# Patient Record
Sex: Female | Born: 1935 | Race: White | Hispanic: No | Marital: Married | Smoking: Never smoker
Health system: Southern US, Community
[De-identification: ages and names within clinical notes are randomized; demographics above are authoritative.]

## PROBLEM LIST (undated history)

## (undated) DIAGNOSIS — R945 Abnormal results of liver function studies: Secondary | ICD-10-CM

## (undated) DIAGNOSIS — I2510 Atherosclerotic heart disease of native coronary artery without angina pectoris: Secondary | ICD-10-CM

## (undated) DIAGNOSIS — I10 Essential (primary) hypertension: Secondary | ICD-10-CM

## (undated) DIAGNOSIS — R7989 Other specified abnormal findings of blood chemistry: Secondary | ICD-10-CM

## (undated) DIAGNOSIS — K589 Irritable bowel syndrome without diarrhea: Secondary | ICD-10-CM

## (undated) DIAGNOSIS — I639 Cerebral infarction, unspecified: Secondary | ICD-10-CM

## (undated) DIAGNOSIS — E785 Hyperlipidemia, unspecified: Secondary | ICD-10-CM

## (undated) HISTORY — DX: Essential (primary) hypertension: I10

## (undated) HISTORY — DX: Irritable bowel syndrome, unspecified: K58.9

## (undated) HISTORY — DX: Hyperlipidemia, unspecified: E78.5

## (undated) HISTORY — DX: Abnormal results of liver function studies: R94.5

## (undated) HISTORY — DX: Atherosclerotic heart disease of native coronary artery without angina pectoris: I25.10

## (undated) HISTORY — PX: FEMUR FRACTURE SURGERY: SHX633

## (undated) HISTORY — DX: Other specified abnormal findings of blood chemistry: R79.89

---

## 1997-08-04 ENCOUNTER — Emergency Department (HOSPITAL_COMMUNITY): Admission: EM | Admit: 1997-08-04 | Discharge: 1997-08-04 | Payer: Self-pay | Admitting: Emergency Medicine

## 1998-03-20 ENCOUNTER — Other Ambulatory Visit: Admission: RE | Admit: 1998-03-20 | Discharge: 1998-03-20 | Payer: Self-pay

## 2002-01-07 DIAGNOSIS — I2510 Atherosclerotic heart disease of native coronary artery without angina pectoris: Secondary | ICD-10-CM

## 2002-01-07 HISTORY — DX: Atherosclerotic heart disease of native coronary artery without angina pectoris: I25.10

## 2006-05-22 ENCOUNTER — Encounter: Payer: Self-pay | Admitting: Cardiology

## 2008-07-27 ENCOUNTER — Emergency Department (HOSPITAL_BASED_OUTPATIENT_CLINIC_OR_DEPARTMENT_OTHER): Admission: EM | Admit: 2008-07-27 | Discharge: 2008-07-27 | Payer: Self-pay | Admitting: Emergency Medicine

## 2010-03-06 ENCOUNTER — Ambulatory Visit (INDEPENDENT_AMBULATORY_CARE_PROVIDER_SITE_OTHER): Payer: Medicare Other | Admitting: Nurse Practitioner

## 2010-03-06 DIAGNOSIS — I10 Essential (primary) hypertension: Secondary | ICD-10-CM

## 2010-03-06 DIAGNOSIS — R42 Dizziness and giddiness: Secondary | ICD-10-CM

## 2010-03-20 ENCOUNTER — Ambulatory Visit: Payer: Medicare Other | Admitting: Nurse Practitioner

## 2010-04-15 LAB — COMPREHENSIVE METABOLIC PANEL
ALT: 71 U/L — ABNORMAL HIGH (ref 0–35)
AST: 95 U/L — ABNORMAL HIGH (ref 0–37)
Albumin: 4.1 g/dL (ref 3.5–5.2)
Chloride: 104 mEq/L (ref 96–112)
Creatinine, Ser: 1.1 mg/dL (ref 0.4–1.2)
GFR calc Af Amer: 57 mL/min — ABNORMAL LOW (ref >60)
Potassium: 3.9 mEq/L (ref 3.5–5.1)
Sodium: 141 mEq/L (ref 135–145)
Total Bilirubin: 0.8 mg/dL (ref 0.3–1.2)

## 2010-04-15 LAB — DIFFERENTIAL
Basophils Absolute: 0 10*3/uL (ref 0.0–0.1)
Eosinophils Relative: 0 % (ref 0–5)
Lymphocytes Relative: 28 % (ref 12–46)
Monocytes Absolute: 0.9 10*3/uL (ref 0.1–1.0)

## 2010-04-15 LAB — URINE CULTURE

## 2010-04-15 LAB — URINALYSIS, ROUTINE W REFLEX MICROSCOPIC
Bilirubin Urine: NEGATIVE
Hgb urine dipstick: NEGATIVE
Ketones, ur: NEGATIVE mg/dL
Specific Gravity, Urine: 1.007 (ref 1.005–1.030)
pH: 5.5 (ref 5.0–8.0)

## 2010-04-15 LAB — CBC
MCV: 95.5 fL (ref 78.0–100.0)
Platelets: 181 10*3/uL (ref 150–400)
WBC: 4.6 10*3/uL (ref 4.0–10.5)

## 2010-04-15 LAB — HEMOCCULT GUIAC POC 1CARD (OFFICE): Fecal Occult Bld: NEGATIVE

## 2010-04-15 LAB — URINE MICROSCOPIC-ADD ON

## 2011-04-23 ENCOUNTER — Encounter: Payer: Self-pay | Admitting: *Deleted

## 2014-03-21 ENCOUNTER — Emergency Department (HOSPITAL_COMMUNITY): Payer: Medicare Other

## 2014-03-21 ENCOUNTER — Emergency Department (HOSPITAL_COMMUNITY)
Admission: EM | Admit: 2014-03-21 | Discharge: 2014-03-22 | Disposition: A | Discharge status: Home / Self Care | Payer: Medicare Other | Attending: Emergency Medicine | Admitting: Emergency Medicine

## 2014-03-21 ENCOUNTER — Encounter (HOSPITAL_COMMUNITY): Payer: Self-pay | Admitting: Emergency Medicine

## 2014-03-21 DIAGNOSIS — S6992XA Unspecified injury of left wrist, hand and finger(s), initial encounter: Secondary | ICD-10-CM | POA: Diagnosis present

## 2014-03-21 DIAGNOSIS — Y998 Other external cause status: Secondary | ICD-10-CM | POA: Diagnosis not present

## 2014-03-21 DIAGNOSIS — Z8719 Personal history of other diseases of the digestive system: Secondary | ICD-10-CM | POA: Insufficient documentation

## 2014-03-21 DIAGNOSIS — N390 Urinary tract infection, site not specified: Secondary | ICD-10-CM | POA: Diagnosis not present

## 2014-03-21 DIAGNOSIS — R42 Dizziness and giddiness: Secondary | ICD-10-CM | POA: Insufficient documentation

## 2014-03-21 DIAGNOSIS — W01198A Fall on same level from slipping, tripping and stumbling with subsequent striking against other object, initial encounter: Secondary | ICD-10-CM | POA: Insufficient documentation

## 2014-03-21 DIAGNOSIS — Y9389 Activity, other specified: Secondary | ICD-10-CM | POA: Insufficient documentation

## 2014-03-21 DIAGNOSIS — Z880 Allergy status to penicillin: Secondary | ICD-10-CM | POA: Diagnosis not present

## 2014-03-21 DIAGNOSIS — Z7982 Long term (current) use of aspirin: Secondary | ICD-10-CM | POA: Insufficient documentation

## 2014-03-21 DIAGNOSIS — S52612A Displaced fracture of left ulna styloid process, initial encounter for closed fracture: Secondary | ICD-10-CM | POA: Insufficient documentation

## 2014-03-21 DIAGNOSIS — Y9289 Other specified places as the place of occurrence of the external cause: Secondary | ICD-10-CM | POA: Diagnosis not present

## 2014-03-21 DIAGNOSIS — Z79899 Other long term (current) drug therapy: Secondary | ICD-10-CM | POA: Insufficient documentation

## 2014-03-21 DIAGNOSIS — I10 Essential (primary) hypertension: Secondary | ICD-10-CM | POA: Insufficient documentation

## 2014-03-21 DIAGNOSIS — S62102A Fracture of unspecified carpal bone, left wrist, initial encounter for closed fracture: Secondary | ICD-10-CM

## 2014-03-21 DIAGNOSIS — Z8639 Personal history of other endocrine, nutritional and metabolic disease: Secondary | ICD-10-CM | POA: Diagnosis not present

## 2014-03-21 DIAGNOSIS — I2510 Atherosclerotic heart disease of native coronary artery without angina pectoris: Secondary | ICD-10-CM | POA: Insufficient documentation

## 2014-03-21 LAB — BASIC METABOLIC PANEL
Anion gap: 13 (ref 5–15)
BUN: 20 mg/dL (ref 6–23)
CALCIUM: 9.7 mg/dL (ref 8.4–10.5)
CO2: 23 mmol/L (ref 19–32)
Chloride: 107 mmol/L (ref 96–112)
Creatinine, Ser: 0.98 mg/dL (ref 0.50–1.10)
GFR, EST AFRICAN AMERICAN: 58 mL/min — AB (ref >90)
GFR, EST NON AFRICAN AMERICAN: 50 mL/min — AB (ref >90)
GLUCOSE: 101 mg/dL — AB (ref 70–99)
Potassium: 3.9 mmol/L (ref 3.5–5.1)
Sodium: 143 mmol/L (ref 135–145)

## 2014-03-21 LAB — CBC WITH DIFFERENTIAL/PLATELET
BASOS ABS: 0 10*3/uL (ref 0.0–0.1)
Basophils Relative: 0 % (ref 0–1)
EOS PCT: 1 % (ref 0–5)
Eosinophils Absolute: 0 10*3/uL (ref 0.0–0.7)
HEMATOCRIT: 41.6 % (ref 36.0–46.0)
HEMOGLOBIN: 14.2 g/dL (ref 12.0–15.0)
LYMPHS ABS: 2.1 10*3/uL (ref 0.7–4.0)
LYMPHS PCT: 26 % (ref 12–46)
MCH: 32.4 pg (ref 26.0–34.0)
MCHC: 34.1 g/dL (ref 30.0–36.0)
MCV: 95 fL (ref 78.0–100.0)
MONO ABS: 1 10*3/uL (ref 0.1–1.0)
MONOS PCT: 12 % (ref 3–12)
NEUTROS ABS: 4.8 10*3/uL (ref 1.7–7.7)
Neutrophils Relative %: 61 % (ref 43–77)
Platelets: 223 10*3/uL (ref 150–400)
RBC: 4.38 MIL/uL (ref 3.87–5.11)
RDW: 12.3 % (ref 11.5–15.5)
WBC: 7.9 10*3/uL (ref 4.0–10.5)

## 2014-03-21 MED ORDER — SODIUM CHLORIDE 0.9 % IV SOLN
INTRAVENOUS | Status: DC
  Administered 2014-03-21: 22:00:00 via INTRAVENOUS

## 2014-03-21 MED ORDER — ONDANSETRON HCL 4 MG/2ML IJ SOLN
4.0000 mg | Freq: Once | INTRAMUSCULAR | Status: AC
  Administered 2014-03-21: 4 mg via INTRAVENOUS
  Filled 2014-03-21: qty 2

## 2014-03-21 MED ORDER — MORPHINE SULFATE 4 MG/ML IJ SOLN
4.0000 mg | Freq: Once | INTRAMUSCULAR | Status: AC
  Administered 2014-03-21: 4 mg via INTRAVENOUS
  Filled 2014-03-21: qty 1

## 2014-03-21 NOTE — ED Provider Notes (Addendum)
CSN: [id number]     Arrival date & time 03/21/14  1825 History   First MD Initiated Contact with Patient 03/21/14 2037     Chief Complaint  Patient presents with  . Fall  . Wrist Injury     (Consider location/radiation/quality/duration/timing/severity/associated sxs/prior Treatment) HPI   Jessica Booth is a 81 y.o. female past medical history significant for CAD complaining of pain to left wrist status post slip and fall with FOOSH this afternoon. Pain is severe, 9 out of 10, exacerbated by movement and palpation. Patient took 50 mg of tramadol at home with little relief. States that she has been having lightheaded sensation, her granddaughter accompanies her in the room and states that these episodes are happening more frequently and she has had more falls. Patient denies any head trauma, anticoagulation, cervicalgia, chest pain, abdominal pain, difficulty ambulating above her baseline (patient is status post right hip replacement. She is right-hand dominant. She denies significantly decreased range of motion.   Past Medical History  Diagnosis Date  . Coronary artery disease 2004    stent RCA  . Hyperlipidemia   . Hypertension   . Irritable bowel syndrome   . Abnormal liver function test    Past Surgical History  Procedure Laterality Date  . Femur fracture surgery     Family History  Problem Relation Age of Onset  . Hypertension Sister    History  Substance Use Topics  . Smoking status: Never Smoker   . Smokeless tobacco: Not on file  . Alcohol Use: Yes     Comment: beer rarely   OB History    No data available     Review of Systems  10 systems reviewed and found to be negative, except as noted in the HPI.   Allergies  Penicillins  Home Medications   Prior to Admission medications   Medication Sig Start Date End Date Taking? Authorizing Provider  amLODipine (NORVASC) 5 MG tablet Take 5 mg by mouth daily.    Historical Provider, MD  aspirin EC 81 MG tablet  Take 81 mg by mouth daily.    Historical Provider, MD  hydrochlorothiazide (HYDRODIURIL) 25 MG tablet Take 25 mg by mouth daily.    Historical Provider, MD  meclizine (ANTIVERT) 25 MG tablet Take 25 mg by mouth See admin instructions. As needed    Historical Provider, MD  metoprolol tartrate (LOPRESSOR) 25 MG tablet Take 25 mg by mouth 2 (two) times daily.    Historical Provider, MD   BP 205/76 mmHg  Pulse 75  Temp(Src) 97.9 F (36.6 C) (Oral)  Resp 18  SpO2 92% Physical Exam  Constitutional: She is oriented to person, place, and time. She appears well-developed and well-nourished. No distress.  HENT:  Head: Normocephalic and atraumatic.  Mouth/Throat: Oropharynx is clear and moist.  No Significant conjunctival pallor  Eyes: Conjunctivae and EOM are normal. Pupils are equal, round, and reactive to light.  Neck: Normal range of motion.  Cardiovascular: Normal rate, regular rhythm and intact distal pulses.   Pulmonary/Chest: Effort normal and breath sounds normal. No stridor. No respiratory distress. She has no wheezes. She has no rales. She exhibits no tenderness.  Abdominal: Soft. Bowel sounds are normal. She exhibits no distension and no mass. There is no tenderness. There is no rebound and no guarding.  Musculoskeletal: Normal range of motion. She exhibits no edema.  Patient with deformity to left wrist. Radial pulse 2+ and cap refill is brisk and fingers are warm. Good range  of motion to all fingers and thumb.  Neurological: She is alert and oriented to person, place, and time.  Psychiatric: She has a normal mood and affect.  Nursing note and vitals reviewed.   ED Course  Procedures (including critical care time)  SPLINT APPLICATION Date/Time: 23:10 Authorized by: Wynetta EmeryPISCIOTTA, Tekisha Darcey Consent: Verbal consent obtained. Risks and benefits: risks, benefits and alternatives were discussed Consent given by: patient Splint applied by: orthopedic technician Location details: Left  wrist Splint type: Sugar tong, well padded to accommodate swelling Supplies used: plaster Post-procedure: The splinted body part was neurovascularly unchanged following the procedure. Patient tolerance: Patient tolerated the procedure well with no immediate complications.    Labs Review Labs Reviewed - No data to display  Imaging Review Dg Forearm Left  03/21/2014   CLINICAL DATA:  Multiple recent falls; left wrist pain, swelling, erythema and deformity. Initial encounter.  EXAM: LEFT FOREARM - 2 VIEW  COMPARISON:  None.  FINDINGS: There is a comminuted distal radial metaphyseal fracture, with significant displacement, better characterized on concurrent wrist radiographs. The proximal radius and ulna appear intact. The elbow joint is incompletely assessed, but appears grossly unremarkable.  The carpal rows are posteriorly displaced, reflecting the displaced fracture at the distal radius. Soft tissue swelling is noted about the fracture site.  IMPRESSION: Comminuted distal radial metaphyseal fracture, with significant displacement, better characterized on concurrent wrist radiographs. Associated posterior displacement of the carpal rows.   Electronically Signed   By: Roanna RaiderJeffery  Chang M.D.   On: 03/21/2014 20:05   Dg Wrist Complete Left  03/21/2014   CLINICAL DATA:  Status post fall, with left wrist injury. Initial encounter.  EXAM: LEFT WRIST - COMPLETE 3+ VIEW  COMPARISON:  None.  FINDINGS: There is a comminuted fracture involving the distal radial metaphysis, with associated impaction and dorsal displacement of fragments. There is associated mild dorsal displacement and angulation of the carpal rows, articulating with the displaced distal radial fragments. A minimally displaced ulnar styloid fracture is suspected. Surrounding soft tissue swelling is noted.  The carpal rows are otherwise grossly unremarkable.  IMPRESSION: Comminuted fracture involving the distal radial metaphysis, with associated  impaction and dorsal displacement of fragments. Mild dorsal displacement and angulation of the carpal rows, articulating with the displaced distal radial fragments. Minimally displaced ulnar styloid fracture suspected.   Electronically Signed   By: Roanna RaiderJeffery  Chang M.D.   On: 03/21/2014 20:08     EKG Interpretation   Date/Time:  Monday March 21 2014 21:51:24 EDT Ventricular Rate:  68 PR Interval:  144 QRS Duration: 88 QT Interval:  418 QTC Calculation: 444 R Axis:   7 Text Interpretation:  Sinus rhythm Low voltage, precordial leads  Borderline T abnormalities, anterior leads Abnormal ekg No old tracing to  compare Confirmed by MILLER  MD, BRIAN ([id number]) on 03/22/2014 12:24:55 AM      MDM   Final diagnoses:  Dizzy spells  Left wrist fracture, closed, initial encounter  UTI (lower urinary tract infection)    Filed Vitals:   03/21/14 1829  BP: 205/76  Pulse: 75  Temp: 97.9 F (36.6 C)  TempSrc: Oral  Resp: 18  SpO2: 92%    Medications  0.9 %  sodium chloride infusion (not administered)  morphine 4 MG/ML injection 4 mg (not administered)  ondansetron (ZOFRAN) injection 4 mg (not administered)    Lucia Gaskinsorothy R. Kruer is a pleasant 81 y.o. female presenting with left (nondominant) wrist fracture status post slip and fall. Patient states that she has been  very dizzy, these episodes are becoming more frequent. Patient has comminuted distal radial fracture with impaction and significant dorsal displacement. Ulna appears intact. NVI and closed.   Hand surgery consult from Dr. Izora Ribas appreciated: he has looked at the Xray, states it does not need emergent reduction. Patient can be followed in the office in the next few days.  Will evaluate the patient's dizzy spells with blood work, EKG and UA. Blood work, each EKG and chest x-ray without abnormality. Urinalysis with signs of infection. Will treat with Keflex and urine cultures ordered.  We've had an extensive discussion about fall  precautions, considering patient's dizziness, her healing femur fracture and the Percocet advised her to walk slowly and with support, stay with family if possible.  Evaluation does not show pathology that would require ongoing emergent intervention or inpatient treatment. Pt is hemodynamically stable and mentating appropriately. Discussed findings and plan with patient/guardian, who agrees with care plan. All questions answered. Return precautions discussed and outpatient follow up given.   New Prescriptions   CEPHALEXIN (KEFLEX) 500 MG CAPSULE    Take 1 capsule (500 mg total) by mouth 2 (two) times daily.   OXYCODONE-ACETAMINOPHEN (PERCOCET/ROXICET) 5-325 MG PER TABLET    1 to 2 tabs PO q6hrs  PRN for pain         Wynetta Emery, PA-C 03/22/14 0032  Eber Hong, MD 03/22/14 1136  [address] PA-C 03/22/14 1320  Eber Hong, MD 03/23/14 [phone]

## 2014-03-21 NOTE — ED Notes (Signed)
Lab called and they states they did receive urine specimen

## 2014-03-21 NOTE — ED Notes (Signed)
Pt states she's been kind of dizzy this afternoon, was going up some steps and slipped. Stuck her left wrist out to catch herself and landed on it. Wrist bandaged with ice from home at this time. After unwrapping can view obvious swelling, redness and deformity at left wrist

## 2014-03-21 NOTE — Progress Notes (Signed)
EDCM spoke to patient at bedside.  Patient confirms she lives at home with her husband.  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  Currently patient's husband is in a facility for rehab.  Patient has a walker and a cane at home.  Patient uses her cane mostly.  Patient also has grab rails in the bathroom, elevated toilet seat, and shower chair at home.  Patient confirms her pcp is Dr. Salvadore [place]orrington [place].  Patient reports she has had home health in the past.  Patient denies the need for home health services at this time.  [facility] informed [facility] that is she feels she needs home health assistance that she may do so through her pcp.  EDCM [place]  Patient thankful for services.  No further EDCM needs at this time.

## 2014-03-21 NOTE — ED Provider Notes (Signed)
The patient is an 81 year old female who had a slip and fall just prior to arrival, states that she tried to catch herself as she fell and suffered a wrist injury on the left. She states that she fell backwards into the side and landed on her left hand and wrist. The pain was acute, persistent, associated with swelling, no numbness or weakness, has normal grip on exam, palpable radial artery, normal capillary refill, significant swelling about the wrist. Imaging performed and shows that the patient has a comminuted impacted and dorsally displaced distal radius fracture. This injury is closed on clinical exam, care was discussed with the hand surgeon by Ms Pisciotta, they recommend follow-up in the office, will splint in a position of comfort, ice packs, elevation, anti-inflammatories. The patient will be asked to f/u with Dr. Izora Ribasoley in the office.   EKG Interpretation  Date/Time:  Monday March 21 2014 21:51:24 EDT Ventricular Rate:  68 PR Interval:  144 QRS Duration: 88 QT Interval:  418 QTC Calculation: 444 R Axis:   7 Text Interpretation:  Sinus rhythm Low voltage, precordial leads Borderline T abnormalities, anterior leads Abnormal ekg No old tracing to compare Confirmed by Delois Tolbert  MD, Gerrad Welker ([id number]) on 03/22/2014 12:24:55 AM      Medical screening examination/treatment/procedure(s) were conducted as a shared visit with non-physician practitioner(s) and myself.  I personally evaluated the patient during the encounter.  Clinical Impression:   Final diagnoses:  Dizzy spells  Left wrist fracture, closed, initial encounter  UTI (lower urinary tract infection)         Eber HongBrian Arlee Bossard, MD 03/22/14 1136

## 2014-03-22 LAB — URINALYSIS, ROUTINE W REFLEX MICROSCOPIC
BILIRUBIN URINE: NEGATIVE
GLUCOSE, UA: NEGATIVE mg/dL
HGB URINE DIPSTICK: NEGATIVE
Ketones, ur: NEGATIVE mg/dL
Nitrite: NEGATIVE
PH: 5.5 (ref 5.0–8.0)
Protein, ur: NEGATIVE mg/dL
SPECIFIC GRAVITY, URINE: 1.008 (ref 1.005–1.030)
Urobilinogen, UA: 0.2 mg/dL (ref 0.0–1.0)

## 2014-03-22 LAB — URINE MICROSCOPIC-ADD ON

## 2014-03-22 MED ORDER — OXYCODONE-ACETAMINOPHEN 5-325 MG PO TABS: ORAL_TABLET | ORAL | Status: DC

## 2014-03-22 MED ORDER — OXYCODONE-ACETAMINOPHEN 5-325 MG PO TABS
1.0000 | ORAL_TABLET | Freq: Once | ORAL | Status: AC
  Administered 2014-03-22: 1 via ORAL
  Filled 2014-03-22: qty 1

## 2014-03-22 MED ORDER — CEPHALEXIN 500 MG PO CAPS: 500.0000 mg | ORAL_CAPSULE | Freq: Two times a day (BID) | ORAL | Status: DC

## 2014-03-22 MED ORDER — CEPHALEXIN 500 MG PO CAPS
500.0000 mg | ORAL_CAPSULE | Freq: Once | ORAL | Status: AC
  Administered 2014-03-22: 500 mg via ORAL
  Filled 2014-03-22: qty 1

## 2014-03-22 NOTE — Discharge Instructions (Signed)
Take percocet for breakthrough pain, do not drink alcohol, drive, care for children or do other critical tasks while taking percocet.  Please be very careful not to fall! The pain medication puts you at risk for falls. Please rest as much as possible and try to not stay alone.   Only use the arm sling for up to 2 days. Take the arm out and rotate the shoulder every 4 hours.   Please follow with your primary care doctor in the next 2 days for a check-up. They must obtain records for further management.   Do not hesitate to return to the Emergency Department for any new, worsening or concerning symptoms.

## 2014-03-24 LAB — URINE CULTURE: Colony Count: >=100000

## 2014-03-27 ENCOUNTER — Telehealth (HOSPITAL_COMMUNITY): Payer: Self-pay

## 2014-03-27 NOTE — Telephone Encounter (Signed)
Post ED Visit - Positive Culture Follow-up  Culture report reviewed by antimicrobial stewardship pharmacist: []  Wes Dulaney, Pharm.D., BCPS []  Celedonio MiyamotoJeremy Frens, Pharm.D., BCPS []  Georgina PillionElizabeth Martin, [address] BCPS [x]  EllisvilleMinh Pham, [address]Ph[address]D., BCPS, AAHIVP []  Estella HuskMichelle Turner, Pharm.D., BCPS, AAHIVP []  Elder CyphersLorie Poole, [address]Ph[address]D., BCPS  Positive Urine culture, >/= 100,000 colonies -> Klebsiella Pneumoniae Treated with Cephalexin, organism sensitive to the same and no further patient follow-up is required at this time.  Jessica Booth, Jessica Booth 03/27/2014, 7:53 PM

## 2016-03-27 ENCOUNTER — Inpatient Hospital Stay (HOSPITAL_COMMUNITY)
Admission: EM | Admit: 2016-03-27 | Discharge: 2016-04-01 | DRG: 469 | Disposition: A | Discharge status: Skilled Nursing Facility | Payer: Medicare Other | Attending: Internal Medicine | Admitting: Internal Medicine

## 2016-03-27 ENCOUNTER — Encounter (HOSPITAL_COMMUNITY): Payer: Self-pay | Admitting: *Deleted

## 2016-03-27 ENCOUNTER — Inpatient Hospital Stay (HOSPITAL_COMMUNITY): Payer: Medicare Other

## 2016-03-27 ENCOUNTER — Emergency Department (HOSPITAL_COMMUNITY): Payer: Medicare Other

## 2016-03-27 ENCOUNTER — Encounter (HOSPITAL_COMMUNITY)
Admission: EM | Disposition: A | Discharge status: Skilled Nursing Facility | Payer: Self-pay | Source: Home / Self Care | Attending: Internal Medicine

## 2016-03-27 ENCOUNTER — Inpatient Hospital Stay (HOSPITAL_COMMUNITY): Payer: Medicare Other | Admitting: Certified Registered Nurse Anesthetist

## 2016-03-27 ENCOUNTER — Other Ambulatory Visit: Payer: Self-pay | Admitting: Emergency Medicine

## 2016-03-27 DIAGNOSIS — R29898 Other symptoms and signs involving the musculoskeletal system: Secondary | ICD-10-CM

## 2016-03-27 DIAGNOSIS — K589 Irritable bowel syndrome without diarrhea: Secondary | ICD-10-CM | POA: Diagnosis present

## 2016-03-27 DIAGNOSIS — S72009A Fracture of unspecified part of neck of unspecified femur, initial encounter for closed fracture: Secondary | ICD-10-CM | POA: Diagnosis present

## 2016-03-27 DIAGNOSIS — Y92002 Bathroom of unspecified non-institutional (private) residence single-family (private) house as the place of occurrence of the external cause: Secondary | ICD-10-CM

## 2016-03-27 DIAGNOSIS — R55 Syncope and collapse: Secondary | ICD-10-CM | POA: Diagnosis present

## 2016-03-27 DIAGNOSIS — G8194 Hemiplegia, unspecified affecting left nondominant side: Secondary | ICD-10-CM | POA: Diagnosis present

## 2016-03-27 DIAGNOSIS — S72032A Displaced midcervical fracture of left femur, initial encounter for closed fracture: Principal | ICD-10-CM

## 2016-03-27 DIAGNOSIS — W1830XA Fall on same level, unspecified, initial encounter: Secondary | ICD-10-CM | POA: Diagnosis present

## 2016-03-27 DIAGNOSIS — I2510 Atherosclerotic heart disease of native coronary artery without angina pectoris: Secondary | ICD-10-CM | POA: Diagnosis present

## 2016-03-27 DIAGNOSIS — Z79899 Other long term (current) drug therapy: Secondary | ICD-10-CM

## 2016-03-27 DIAGNOSIS — I639 Cerebral infarction, unspecified: Secondary | ICD-10-CM | POA: Diagnosis not present

## 2016-03-27 DIAGNOSIS — E785 Hyperlipidemia, unspecified: Secondary | ICD-10-CM | POA: Diagnosis present

## 2016-03-27 DIAGNOSIS — I10 Essential (primary) hypertension: Secondary | ICD-10-CM | POA: Diagnosis present

## 2016-03-27 DIAGNOSIS — Z880 Allergy status to penicillin: Secondary | ICD-10-CM | POA: Diagnosis not present

## 2016-03-27 DIAGNOSIS — R29810 Facial weakness: Secondary | ICD-10-CM | POA: Diagnosis present

## 2016-03-27 DIAGNOSIS — Z955 Presence of coronary angioplasty implant and graft: Secondary | ICD-10-CM

## 2016-03-27 DIAGNOSIS — Z66 Do not resuscitate: Secondary | ICD-10-CM | POA: Diagnosis present

## 2016-03-27 DIAGNOSIS — R42 Dizziness and giddiness: Secondary | ICD-10-CM | POA: Diagnosis present

## 2016-03-27 DIAGNOSIS — Z7982 Long term (current) use of aspirin: Secondary | ICD-10-CM | POA: Diagnosis not present

## 2016-03-27 DIAGNOSIS — F329 Major depressive disorder, single episode, unspecified: Secondary | ICD-10-CM | POA: Diagnosis present

## 2016-03-27 DIAGNOSIS — I6789 Other cerebrovascular disease: Secondary | ICD-10-CM | POA: Diagnosis not present

## 2016-03-27 DIAGNOSIS — S72002A Fracture of unspecified part of neck of left femur, initial encounter for closed fracture: Secondary | ICD-10-CM | POA: Diagnosis not present

## 2016-03-27 DIAGNOSIS — Z8249 Family history of ischemic heart disease and other diseases of the circulatory system: Secondary | ICD-10-CM

## 2016-03-27 DIAGNOSIS — H9190 Unspecified hearing loss, unspecified ear: Secondary | ICD-10-CM | POA: Diagnosis present

## 2016-03-27 DIAGNOSIS — Z96649 Presence of unspecified artificial hip joint: Secondary | ICD-10-CM

## 2016-03-27 HISTORY — PX: HIP ARTHROPLASTY: SHX981

## 2016-03-27 LAB — COMPREHENSIVE METABOLIC PANEL
ALBUMIN: 4.2 g/dL (ref 3.5–5.0)
ALT: 47 U/L (ref 14–54)
ALT: 46 U/L (ref 14–54)
ANION GAP: 13 (ref 5–15)
AST: 52 U/L — ABNORMAL HIGH (ref 15–41)
AST: 49 U/L — AB (ref 15–41)
Albumin: 4.2 g/dL (ref 3.5–5.0)
Alkaline Phosphatase: 44 U/L (ref 38–126)
Alkaline Phosphatase: 42 U/L (ref 38–126)
Anion gap: 13 (ref 5–15)
BUN: 16 mg/dL (ref 6–20)
BUN: 14 mg/dL (ref 6–20)
CHLORIDE: 106 mmol/L (ref 101–111)
CHLORIDE: 107 mmol/L (ref 101–111)
CO2: 22 mmol/L (ref 22–32)
CO2: 22 mmol/L (ref 22–32)
CREATININE: 1.07 mg/dL — AB (ref 0.44–1.00)
Calcium: 9.3 mg/dL (ref 8.9–10.3)
Calcium: 9.3 mg/dL (ref 8.9–10.3)
Creatinine, Ser: 1.12 mg/dL — ABNORMAL HIGH (ref 0.44–1.00)
GFR calc Af Amer: 51 mL/min — ABNORMAL LOW (ref >60)
GFR calc non Af Amer: 42 mL/min — ABNORMAL LOW (ref >60)
GFR, EST AFRICAN AMERICAN: 48 mL/min — AB (ref >60)
GFR, EST NON AFRICAN AMERICAN: 44 mL/min — AB (ref >60)
GLUCOSE: 136 mg/dL — AB (ref 65–99)
Glucose, Bld: 103 mg/dL — ABNORMAL HIGH (ref 65–99)
Potassium: 3.9 mmol/L (ref 3.5–5.1)
Potassium: 4.4 mmol/L (ref 3.5–5.1)
SODIUM: 141 mmol/L (ref 135–145)
SODIUM: 142 mmol/L (ref 135–145)
Total Bilirubin: 0.9 mg/dL (ref 0.3–1.2)
Total Bilirubin: 1.1 mg/dL (ref 0.3–1.2)
Total Protein: 6.6 g/dL (ref 6.5–8.1)
Total Protein: 6.8 g/dL (ref 6.5–8.1)

## 2016-03-27 LAB — CBC WITH DIFFERENTIAL/PLATELET
Basophils Absolute: 0 10*3/uL (ref 0.0–0.1)
Basophils Absolute: 0 10*3/uL (ref 0.0–0.1)
Basophils Relative: 0 %
Basophils Relative: 0 %
EOS ABS: 0 10*3/uL (ref 0.0–0.7)
EOS ABS: 0 10*3/uL (ref 0.0–0.7)
EOS PCT: 0 %
Eosinophils Relative: 0 %
HCT: 39.4 % (ref 36.0–46.0)
HCT: 40.4 % (ref 36.0–46.0)
HEMOGLOBIN: 13.5 g/dL (ref 12.0–15.0)
HEMOGLOBIN: 13.8 g/dL (ref 12.0–15.0)
LYMPHS PCT: 20 %
Lymphocytes Relative: 19 %
Lymphs Abs: 1.5 10*3/uL (ref 0.7–4.0)
Lymphs Abs: 2 10*3/uL (ref 0.7–4.0)
MCH: 32 pg (ref 26.0–34.0)
MCH: 32.1 pg (ref 26.0–34.0)
MCHC: 34.3 g/dL (ref 30.0–36.0)
MCHC: 34.2 g/dL (ref 30.0–36.0)
MCV: 93.4 fL (ref 78.0–100.0)
MCV: 94 fL (ref 78.0–100.0)
Monocytes Absolute: 1.1 10*3/uL — ABNORMAL HIGH (ref 0.1–1.0)
Monocytes Absolute: 1.1 10*3/uL — ABNORMAL HIGH (ref 0.1–1.0)
Monocytes Relative: 13 %
Monocytes Relative: 11 %
NEUTROS PCT: 68 %
NEUTROS PCT: 69 %
Neutro Abs: 5.4 10*3/uL (ref 1.7–7.7)
Neutro Abs: 6.9 10*3/uL (ref 1.7–7.7)
PLATELETS: 222 10*3/uL (ref 150–400)
Platelets: 216 10*3/uL (ref 150–400)
RBC: 4.22 MIL/uL (ref 3.87–5.11)
RBC: 4.3 MIL/uL (ref 3.87–5.11)
RDW: 12.3 % (ref 11.5–15.5)
RDW: 12.7 % (ref 11.5–15.5)
WBC: 8 10*3/uL (ref 4.0–10.5)
WBC: 10 10*3/uL (ref 4.0–10.5)

## 2016-03-27 LAB — URINALYSIS, ROUTINE W REFLEX MICROSCOPIC
Bilirubin Urine: NEGATIVE
Glucose, UA: NEGATIVE mg/dL
KETONES UR: NEGATIVE mg/dL
NITRITE: NEGATIVE
PH: 7.5 (ref 5.0–8.0)
Protein, ur: NEGATIVE mg/dL
Specific Gravity, Urine: 1.01 (ref 1.005–1.030)

## 2016-03-27 LAB — URINALYSIS, MICROSCOPIC (REFLEX): RBC / HPF: NONE SEEN RBC/hpf (ref 0–5)

## 2016-03-27 LAB — TYPE AND SCREEN
ABO/RH(D): B NEG
Antibody Screen: NEGATIVE

## 2016-03-27 LAB — ABO/RH: ABO/RH(D): B NEG

## 2016-03-27 SURGERY — HEMIARTHROPLASTY, HIP, DIRECT ANTERIOR APPROACH, FOR FRACTURE
Anesthesia: General | Site: Hip | Laterality: Left

## 2016-03-27 MED ORDER — MORPHINE SULFATE (PF) 4 MG/ML IV SOLN
2.0000 mg | Freq: Once | INTRAVENOUS | Status: AC
  Administered 2016-03-27: 2 mg via INTRAVENOUS
  Filled 2016-03-27: qty 1

## 2016-03-27 MED ORDER — MECLIZINE HCL 12.5 MG PO TABS
12.5000 mg | ORAL_TABLET | Freq: Two times a day (BID) | ORAL | Status: DC | PRN
  Filled 2016-03-27: qty 1

## 2016-03-27 MED ORDER — ASPIRIN EC 81 MG PO TBEC: 81.0000 mg | DELAYED_RELEASE_TABLET | Freq: Every day | ORAL | 1 refills | Status: DC

## 2016-03-27 MED ORDER — EPHEDRINE 5 MG/ML INJ
INTRAVENOUS | Status: AC
  Filled 2016-03-27: qty 10

## 2016-03-27 MED ORDER — MORPHINE SULFATE (PF) 2 MG/ML IV SOLN: 0.5000 mg | INTRAVENOUS | Status: DC | PRN

## 2016-03-27 MED ORDER — 0.9 % SODIUM CHLORIDE (POUR BTL) OPTIME
TOPICAL | Status: DC | PRN
  Administered 2016-03-27: 1000 mL

## 2016-03-27 MED ORDER — CEFAZOLIN SODIUM-DEXTROSE 2-4 GM/100ML-% IV SOLN
2.0000 g | Freq: Four times a day (QID) | INTRAVENOUS | Status: AC
  Administered 2016-03-27 (×2): 2 g via INTRAVENOUS
  Filled 2016-03-27 (×2): qty 100

## 2016-03-27 MED ORDER — LACTATED RINGERS IV SOLN
INTRAVENOUS | Status: DC
  Administered 2016-03-27: 11:00:00 via INTRAVENOUS

## 2016-03-27 MED ORDER — METOPROLOL TARTRATE 5 MG/5ML IV SOLN
2.5000 mg | Freq: Three times a day (TID) | INTRAVENOUS | Status: DC
  Administered 2016-03-27 – 2016-03-28 (×2): 2.5 mg via INTRAVENOUS
  Filled 2016-03-27 (×3): qty 5

## 2016-03-27 MED ORDER — MORPHINE SULFATE (PF) 2 MG/ML IV SOLN
0.5000 mg | INTRAVENOUS | Status: DC | PRN
  Administered 2016-03-27 (×2): 0.5 mg via INTRAVENOUS
  Filled 2016-03-27 (×2): qty 1

## 2016-03-27 MED ORDER — CHLORHEXIDINE GLUCONATE 4 % EX LIQD
60.0000 mL | Freq: Once | CUTANEOUS | Status: DC
  Administered 2016-03-27: 4 via TOPICAL

## 2016-03-27 MED ORDER — METOCLOPRAMIDE HCL 5 MG/ML IJ SOLN: 5.0000 mg | Freq: Three times a day (TID) | INTRAMUSCULAR | Status: DC | PRN

## 2016-03-27 MED ORDER — PROPOFOL 10 MG/ML IV BOLUS
INTRAVENOUS | Status: DC | PRN
  Administered 2016-03-27: 140 mg via INTRAVENOUS

## 2016-03-27 MED ORDER — POVIDONE-IODINE 10 % EX SWAB: 2.0000 "application " | Freq: Once | CUTANEOUS | Status: DC

## 2016-03-27 MED ORDER — ASPIRIN EC 325 MG PO TBEC
325.0000 mg | DELAYED_RELEASE_TABLET | Freq: Every day | ORAL | Status: DC
  Administered 2016-03-28 – 2016-03-30 (×3): 325 mg via ORAL
  Filled 2016-03-27 (×3): qty 1

## 2016-03-27 MED ORDER — SODIUM CHLORIDE 0.9 % IV SOLN
INTRAVENOUS | Status: DC
  Administered 2016-03-27 (×2): via INTRAVENOUS

## 2016-03-27 MED ORDER — MENTHOL 3 MG MT LOZG: 1.0000 | LOZENGE | OROMUCOSAL | Status: DC | PRN

## 2016-03-27 MED ORDER — CEFAZOLIN SODIUM-DEXTROSE 2-4 GM/100ML-% IV SOLN
INTRAVENOUS | Status: AC
  Filled 2016-03-27: qty 100

## 2016-03-27 MED ORDER — FENTANYL CITRATE (PF) 100 MCG/2ML IJ SOLN
INTRAMUSCULAR | Status: AC
  Filled 2016-03-27: qty 4

## 2016-03-27 MED ORDER — ONDANSETRON HCL 4 MG/2ML IJ SOLN
INTRAMUSCULAR | Status: AC
  Filled 2016-03-27: qty 2

## 2016-03-27 MED ORDER — FENTANYL CITRATE (PF) 100 MCG/2ML IJ SOLN
INTRAMUSCULAR | Status: DC | PRN
  Administered 2016-03-27: 25 ug via INTRAVENOUS
  Administered 2016-03-27: 75 ug via INTRAVENOUS
  Administered 2016-03-27: 25 ug via INTRAVENOUS

## 2016-03-27 MED ORDER — SODIUM CHLORIDE 0.9 % IV SOLN
2000.0000 mg | Freq: Once | INTRAVENOUS | Status: DC
  Filled 2016-03-27 (×2): qty 20

## 2016-03-27 MED ORDER — VANCOMYCIN HCL IN DEXTROSE 1-5 GM/200ML-% IV SOLN
1000.0000 mg | INTRAVENOUS | Status: DC
  Filled 2016-03-27: qty 200

## 2016-03-27 MED ORDER — PHENYLEPHRINE 40 MCG/ML (10ML) SYRINGE FOR IV PUSH (FOR BLOOD PRESSURE SUPPORT)
PREFILLED_SYRINGE | INTRAVENOUS | Status: DC | PRN
  Administered 2016-03-27: 120 ug via INTRAVENOUS

## 2016-03-27 MED ORDER — ONDANSETRON HCL 4 MG/2ML IJ SOLN
INTRAMUSCULAR | Status: DC | PRN
  Administered 2016-03-27: 4 mg via INTRAVENOUS

## 2016-03-27 MED ORDER — TRANEXAMIC ACID 1000 MG/10ML IV SOLN
1000.0000 mg | INTRAVENOUS | Status: AC
  Administered 2016-03-27: 1000 mg via INTRAVENOUS
  Filled 2016-03-27 (×2): qty 10

## 2016-03-27 MED ORDER — SODIUM CHLORIDE 0.9 % IV SOLN
Freq: Once | INTRAVENOUS | Status: AC
  Administered 2016-03-27: 1000 mL via INTRAVENOUS

## 2016-03-27 MED ORDER — FENTANYL CITRATE (PF) 100 MCG/2ML IJ SOLN: 0.5000 ug/kg | INTRAMUSCULAR | Status: DC | PRN

## 2016-03-27 MED ORDER — ENSURE ENLIVE PO LIQD
237.0000 mL | Freq: Two times a day (BID) | ORAL | Status: DC
  Administered 2016-03-29 – 2016-04-01 (×3): 237 mL via ORAL

## 2016-03-27 MED ORDER — ONDANSETRON HCL 4 MG/2ML IJ SOLN
4.0000 mg | Freq: Once | INTRAMUSCULAR | Status: AC
  Administered 2016-03-27: 4 mg via INTRAVENOUS
  Filled 2016-03-27: qty 2

## 2016-03-27 MED ORDER — ACETAMINOPHEN 325 MG PO TABS: 650.0000 mg | ORAL_TABLET | Freq: Four times a day (QID) | ORAL | Status: DC | PRN

## 2016-03-27 MED ORDER — METOCLOPRAMIDE HCL 5 MG PO TABS: 5.0000 mg | ORAL_TABLET | Freq: Three times a day (TID) | ORAL | Status: DC | PRN

## 2016-03-27 MED ORDER — ONDANSETRON HCL 4 MG PO TABS: 4.0000 mg | ORAL_TABLET | Freq: Four times a day (QID) | ORAL | Status: DC | PRN

## 2016-03-27 MED ORDER — ONDANSETRON HCL 4 MG/2ML IJ SOLN
4.0000 mg | Freq: Four times a day (QID) | INTRAMUSCULAR | Status: DC | PRN
  Administered 2016-03-31: 4 mg via INTRAVENOUS
  Filled 2016-03-27: qty 2

## 2016-03-27 MED ORDER — ACETAMINOPHEN 650 MG RE SUPP
650.0000 mg | Freq: Four times a day (QID) | RECTAL | Status: DC | PRN
Start: 2016-03-27 — End: 2016-04-01

## 2016-03-27 MED ORDER — HYDRALAZINE HCL 20 MG/ML IJ SOLN: 10.0000 mg | INTRAMUSCULAR | Status: DC | PRN

## 2016-03-27 MED ORDER — CEFAZOLIN SODIUM-DEXTROSE 2-4 GM/100ML-% IV SOLN
2.0000 g | Freq: Once | INTRAVENOUS | Status: AC
Start: 2016-03-27 — End: 2016-03-27
  Administered 2016-03-27: 2 g via INTRAVENOUS

## 2016-03-27 MED ORDER — HYDROCODONE-ACETAMINOPHEN 5-325 MG PO TABS
1.0000 | ORAL_TABLET | Freq: Four times a day (QID) | ORAL | Status: DC | PRN
  Administered 2016-03-28 – 2016-03-30 (×3): 2 via ORAL
  Administered 2016-03-31: 1 via ORAL
  Administered 2016-03-31: 2 via ORAL
  Filled 2016-03-27 (×4): qty 2
  Filled 2016-03-27: qty 1

## 2016-03-27 MED ORDER — PROPOFOL 10 MG/ML IV BOLUS
INTRAVENOUS | Status: AC
  Filled 2016-03-27: qty 20

## 2016-03-27 MED ORDER — LIDOCAINE HCL (CARDIAC) 20 MG/ML IV SOLN
INTRAVENOUS | Status: DC | PRN
  Administered 2016-03-27: 60 mg via INTRAVENOUS

## 2016-03-27 MED ORDER — SUGAMMADEX SODIUM 200 MG/2ML IV SOLN
INTRAVENOUS | Status: DC | PRN
Start: 2016-03-27 — End: 2016-03-27
  Administered 2016-03-27: 200 mg via INTRAVENOUS

## 2016-03-27 MED ORDER — PHENOL 1.4 % MT LIQD
1.0000 | OROMUCOSAL | Status: DC | PRN
Start: 2016-03-27 — End: 2016-04-01

## 2016-03-27 MED ORDER — MIDAZOLAM HCL 2 MG/2ML IJ SOLN
INTRAMUSCULAR | Status: AC
  Filled 2016-03-27: qty 2

## 2016-03-27 MED ORDER — SUCCINYLCHOLINE CHLORIDE 200 MG/10ML IV SOSY
PREFILLED_SYRINGE | INTRAVENOUS | Status: AC
Start: 2016-03-27 — End: 2016-03-27
  Filled 2016-03-27: qty 10

## 2016-03-27 MED ORDER — ROCURONIUM BROMIDE 100 MG/10ML IV SOLN
INTRAVENOUS | Status: DC | PRN
  Administered 2016-03-27: 40 mg via INTRAVENOUS

## 2016-03-27 MED ORDER — PHENYLEPHRINE HCL 10 MG/ML IJ SOLN
INTRAVENOUS | Status: DC | PRN
  Administered 2016-03-27: 60 ug/min via INTRAVENOUS

## 2016-03-27 MED ORDER — ACETAMINOPHEN 500 MG PO TABS
500.0000 mg | ORAL_TABLET | Freq: Four times a day (QID) | ORAL | 0 refills | Status: DC | PRN
Start: 2016-03-27 — End: 2016-03-30

## 2016-03-27 SURGICAL SUPPLY — 45 items
BLADE SAW SAG 73X25 THK (BLADE) ×2
BLADE SAW SGTL 73X25 THK (BLADE) ×1 IMPLANT
BLADE SAW SGTL 83.5X18.5 (BLADE) ×2 IMPLANT
BRUSH FEMORAL CANAL (MISCELLANEOUS) IMPLANT
CAPT HIP HEMI 1 ×2 IMPLANT
COVER SURGICAL LIGHT HANDLE (MISCELLANEOUS) ×3 IMPLANT
DRAPE IMP U-DRAPE 54X76 (DRAPES) ×3 IMPLANT
DRAPE INCISE IOBAN 66X45 STRL (DRAPES) ×2 IMPLANT
DRAPE INCISE IOBAN 85X60 (DRAPES) ×6 IMPLANT
DRAPE ORTHO SPLIT 77X108 STRL (DRAPES) ×6
DRAPE SURG ORHT 6 SPLT 77X108 (DRAPES) ×2 IMPLANT
DRAPE U-SHAPE 47X51 STRL (DRAPES) ×3 IMPLANT
DRSG MEPILEX BORDER 4X8 (GAUZE/BANDAGES/DRESSINGS) ×6 IMPLANT
DURAPREP 26ML APPLICATOR (WOUND CARE) ×3 IMPLANT
ELECT BLADE 6.5 EXT (BLADE) IMPLANT
ELECT CAUTERY BLADE 6.4 (BLADE) IMPLANT
ELECT REM PT RETURN 9FT ADLT (ELECTROSURGICAL) ×3
ELECTRODE REM PT RTRN 9FT ADLT (ELECTROSURGICAL) ×1 IMPLANT
GAUZE SPONGE 4X4 12PLY STRL (GAUZE/BANDAGES/DRESSINGS) ×3 IMPLANT
GLOVE BIOGEL PI IND STRL 9 (GLOVE) ×1 IMPLANT
GLOVE BIOGEL PI INDICATOR 9 (GLOVE) ×2
GLOVE SURG ORTHO 9.0 STRL STRW (GLOVE) ×3 IMPLANT
GOWN STRL REUS W/ TWL XL LVL3 (GOWN DISPOSABLE) ×1 IMPLANT
GOWN STRL REUS W/TWL XL LVL3 (GOWN DISPOSABLE) ×3
HANDPIECE INTERPULSE COAX TIP (DISPOSABLE)
KIT BASIN OR (CUSTOM PROCEDURE TRAY) ×3 IMPLANT
KIT ROOM TURNOVER OR (KITS) ×3 IMPLANT
MANIFOLD NEPTUNE II (INSTRUMENTS) ×3 IMPLANT
NS IRRIG 1000ML POUR BTL (IV SOLUTION) ×3 IMPLANT
PACK TOTAL JOINT (CUSTOM PROCEDURE TRAY) ×3 IMPLANT
PACK UNIVERSAL I (CUSTOM PROCEDURE TRAY) ×3 IMPLANT
PAD ARMBOARD 7.5X6 YLW CONV (MISCELLANEOUS) ×6 IMPLANT
SET HNDPC FAN SPRY TIP SCT (DISPOSABLE) IMPLANT
STAPLER VISISTAT 35W (STAPLE) ×3 IMPLANT
SUT ETHIBOND NAB CT1 #1 30IN (SUTURE) ×3 IMPLANT
SUT VIC AB 1 CT1 27 (SUTURE) ×6
SUT VIC AB 1 CT1 27XBRD ANBCTR (SUTURE) ×1 IMPLANT
SUT VIC AB 2-0 CT1 27 (SUTURE) ×3
SUT VIC AB 2-0 CT1 TAPERPNT 27 (SUTURE) IMPLANT
SUT VIC AB 2-0 CTB1 (SUTURE) ×3 IMPLANT
TOWEL OR 17X24 6PK STRL BLUE (TOWEL DISPOSABLE) ×3 IMPLANT
TOWEL OR 17X26 10 PK STRL BLUE (TOWEL DISPOSABLE) ×3 IMPLANT
TOWER CARTRIDGE SMART MIX (DISPOSABLE) IMPLANT
TRAY FOLEY W/METER SILVER 16FR (SET/KITS/TRAYS/PACK) IMPLANT
WATER STERILE IRR 1000ML POUR (IV SOLUTION) ×9 IMPLANT

## 2016-03-27 NOTE — ED Triage Notes (Signed)
The pt arrived by gems from home  She has been dizzy for one month  tolnight she fell in the br onto her lt side  c/o lt thigh pain just below her lt hip   No loc.  She lives alone  Also c/o some heaviness  Lt arm for one month  She arrived with a c-collar in place but she she removed it  Hysterical crying on arrival  Unable to locate family

## 2016-03-27 NOTE — Anesthesia Preprocedure Evaluation (Addendum)
Anesthesia Evaluation  Patient identified by MRN, date of birth, ID band Patient awake    Airway Mallampati: II  TM Distance: >3 FB Neck ROM: Full    Dental  (+) Teeth Intact, Dental Advisory Given   Pulmonary    breath sounds clear to auscultation       Cardiovascular hypertension, Pt. on medications + CAD   Rhythm:Regular Rate:Normal     Neuro/Psych    GI/Hepatic   Endo/Other    Renal/GU      Musculoskeletal   Abdominal   Peds  Hematology   Anesthesia Other Findings   Reproductive/Obstetrics                            Anesthesia Physical Anesthesia Plan  ASA: III  Anesthesia Plan: General   Post-op Pain Management:    Induction: Intravenous  Airway Management Planned: Oral ETT  Additional Equipment:   Intra-op Plan:   Post-operative Plan: Extubation in OR  Informed Consent: I have reviewed the patients History and Physical, chart, labs and discussed the procedure including the risks, benefits and alternatives for the proposed anesthesia with the patient or authorized representative who has indicated his/her understanding and acceptance.   Dental advisory given  Plan Discussed with: CRNA, Anesthesiologist and Surgeon  Anesthesia Plan Comments:         Anesthesia Quick Evaluation

## 2016-03-27 NOTE — Progress Notes (Signed)
Initial Nutrition Assessment  DOCUMENTATION CODES:   Not applicable  INTERVENTION:  - Ensure Enlive BID, each supplement provides 350 calories and 20 grams protein  NUTRITION DIAGNOSIS:   Increased nutrient needs related to other (see comment) (Hip fracture and surgery) as evidenced by estimated needs.  GOAL:   Patient will meet greater than or equal to 90% of their needs  MONITOR:   PO intake, Supplement acceptance, I & O's, Weight trends, Labs  REASON FOR ASSESSMENT:   Consult Hip fracture protocol  ASSESSMENT:   83 y.o. female with PMH of CAD s/p stenting, HTN presents to the ER after a fall at home. Pt reports intermittent dizziness over the past few weeks. Yesterday pt suddenly passed out and hit her head. Pt was brought to the ER. Pt had some palpitations prior to the fall. Pt is s/p hemiarthroplasty femoral neck fracture   Pt slightly confused and lethargic at time of visit.   Pt discussed at length that she is depressed and it is difficult to prepare meals for one person. Pt reports cooking for herself PTA, stating she eats small frequent meals throughout the day. Pt reports granddaughter takes her out to eat occasionally and buys her groceries. Per pt, she is looking into receiving meal delivery services.   Pt reports recent weight loss but unaware of amount or timeframe. Per chart review there is no weight history available.  Pt's diet was advanced to Regular today at 1344. RD to monitor PO intake.  Labs and medications reviewed  Nutrition-focused physical exam completed. Findings include no fat depletion, mild-moderate muscle depletion, and no edema  Diet Order:  Diet regular Room service appropriate? Yes; Fluid consistency: Thin  Skin:  Reviewed, no issues  Last BM:  PTA  Height:   Ht Readings from Last 1 Encounters:  03/27/16 5\' 4"  (1.626 m)    Weight:   Wt Readings from Last 1 Encounters:  03/27/16 110 lb (49.9 kg)    Ideal Body Weight:  54.5  kg  BMI:  Body mass index is 18.88 kg/m.  Estimated Nutritional Needs:   Kcal:  1200-1400  Protein:  60-70 grams  Fluid:  >/= 1.2 L/d  EDUCATION NEEDS:   Education needs no appropriate at this time  [employer] Ioann[employer] Dietetic Intern

## 2016-03-27 NOTE — Consult Note (Signed)
ORTHOPAEDIC CONSULTATION  REQUESTING PHYSICIAN: Bonnielee Haff, MD  Chief Complaint: Left hip pain  Assessment: Active Problems:   LEFT Femoral neck Hip fracture (HCC)   Essential hypertension   CAD (coronary artery disease)   Transcervical fracture of femur, left, closed, initial encounter [facility])  Plan: Plan for Operative fixation later today. -NPO -Medicine team to admit and perform pre-op clearance -PT/OT post op -will ammend WB status postop, bedrest for now -Foley for comfort to be removed POD 1/2 -Likely to require Rehab or SNF placement upon discharge.  -VTE prophylaxis: SCDs.  Will update post op.  The risks benefits and alternatives of the procedure were discussed with the patient and her family. The patient verbalizes understanding and wishes to proceed.   Up to today she was previously quite mobile/ambulatory and would like to fix her hip so that she will be able to walk/bear weight.   HPI: Jessica Booth is a 83 y.o. female who complains of left hip pain after a fall around midnight last night when getting up to the refrigerator. She presented to the ED where XR shows acute left femoral neck fracture.  Orthopedics was consulted for evaluation.  The patient's pain now is well controlled. She denies history of DVT/PE. She was previously ambulatory, able to move up and down stairs and walk about her property with the aid of a cane.   Past Medical History:  Diagnosis Date  . Abnormal liver function test   . Coronary artery disease 2004   stent RCA  . Hyperlipidemia   . Hypertension   . Irritable bowel syndrome    Past Surgical History:  Procedure Laterality Date  . FEMUR FRACTURE SURGERY     Social History   Social History  . Marital status: Married    Spouse name: N/A  . Number of children: N/A  . Years of education: N/A   Social History Main Topics  . Smoking status: Never Smoker  . Smokeless tobacco: Never Used  . Alcohol use Yes     Comment:  beer rarely  . Drug use: Unknown  . Sexual activity: Not Asked   Other Topics Concern  . None   Social History Narrative  . None   Family History  Problem Relation Age of Onset  . Hypertension Sister    Allergies  Allergen Reactions  . Penicillins Hives and Rash   Prior to Admission medications   Medication Sig Start Date End Date Taking? Authorizing Provider  aspirin EC 81 MG tablet Take 81 mg by mouth daily.   Yes Historical Provider, MD  hydrochlorothiazide (HYDRODIURIL) 25 MG tablet Take 25 mg by mouth daily.   Yes Historical Provider, MD  oxyCODONE-acetaminophen (PERCOCET/ROXICET) 5-325 MG per tablet 1 to 2 tabs PO q6hrs  PRN for pain 03/22/14  Yes Nicole Pisciotta, PA-C  ALPRAZolam (XANAX) 0.25 MG tablet Take 0.25 mg by mouth at bedtime as needed for anxiety (anxiety).    Historical Provider, MD  cephALEXin (KEFLEX) 500 MG capsule Take 1 capsule (500 mg total) by mouth 2 (two) times daily. 03/22/14   Nicole Pisciotta, PA-C  clotrimazole (MYCELEX) 10 MG troche Take 10 mg by mouth 5 (five) times daily.    Historical Provider, MD  metoprolol tartrate (LOPRESSOR) 25 MG tablet Take 25 mg by mouth 2 (two) times daily.    Historical Provider, MD  ranitidine (ZANTAC) 150 MG tablet Take 150 mg by mouth 2 (two) times daily.    Historical Provider, MD  traMADol (  ULTRAM) 50 MG tablet Take 50 mg by mouth every 6 (six) hours as needed for moderate pain or severe pain (pain).    Historical Provider, MD   Dg Chest 1 View  Result Date: 03/27/2016 CLINICAL DATA:  Chronic dizziness. Status post fall onto left side. Concern for chest injury. Initial encounter. EXAM: CHEST 1 VIEW COMPARISON:  Chest radiograph performed 03/21/2014 FINDINGS: The lungs are well-aerated. Scarring is noted at the right lung apex. There is no evidence of pleural effusion or pneumothorax. The cardiomediastinal silhouette is borderline normal in size. No acute osseous abnormalities are seen. IMPRESSION: Scarring at the right  lung apex. No acute cardiopulmonary process seen. Electronically Signed   By: Garald Balding M.D.   On: 03/27/2016 02:48   Ct Head Wo Contrast  Result Date: 03/27/2016 CLINICAL DATA:  Status post fall on left side in bathroom. Concern for head or cervical spine injury. Initial encounter. EXAM: CT HEAD WITHOUT CONTRAST CT CERVICAL SPINE WITHOUT CONTRAST TECHNIQUE: Multidetector CT imaging of the head and cervical spine was performed following the standard protocol without intravenous contrast. Multiplanar CT image reconstructions of the cervical spine were also generated. COMPARISON:  None. FINDINGS: CT HEAD FINDINGS Brain: No evidence of acute infarction, hemorrhage, hydrocephalus, extra-axial collection or mass lesion/mass effect. Prominence of the ventricles and sulci reflects moderately severe cortical volume loss. Cerebellar atrophy is noted. Scattered periventricular and subcortical matter change likely reflects small vessel ischemic microangiopathy. The brainstem and fourth ventricle are within normal limits. The basal ganglia are unremarkable in appearance. The cerebral hemispheres demonstrate grossly normal Uemura-white differentiation. No mass effect or midline shift is seen. Vascular: No hyperdense vessel or unexpected calcification. Skull: There is no evidence of fracture; visualized osseous structures are unremarkable in appearance. Sinuses/Orbits: The orbits are within normal limits. The paranasal sinuses and mastoid air cells are well-aerated. Other: Soft tissue swelling is noted overlying the left parietal calvarium. CT CERVICAL SPINE FINDINGS Alignment: There is mild grade 1 retrolisthesis of C4 on C5 and of C5 on C6, with associated disc space narrowing. Skull base and vertebrae: No acute fracture. No primary bone lesion or focal pathologic process. Soft tissues and spinal canal: No prevertebral fluid or swelling. No visible canal hematoma. Disc levels: Multilevel disc space narrowing along the  cervical spine, with scattered anterior and posterior disc osteophyte complexes. Degenerative change is noted about the dens. Upper chest: Scarring is noted at the lung apices, with associated calcification at the right lung apex. Calcification is noted at the carotid bifurcations bilaterally. A 1.3 cm hypodensity at the left thyroid lobe is likely benign, given its size. Other: No additional soft tissue abnormalities are seen. IMPRESSION: 1. No evidence of traumatic intracranial injury or fracture. 2. No evidence of fracture or subluxation along the cervical spine. 3. Soft tissue swelling overlying the left parietal calvarium. 4. Moderately severe cortical volume loss and scattered small vessel ischemic microangiopathy. 5. Mild degenerative change along the cervical spine. 6. Scarring at the lung apices, with associated calcification at the right lung apex. 7. Calcification at the carotid bifurcations bilaterally. Carotid ultrasound would be helpful for further evaluation, when and as deemed clinically appropriate. Electronically Signed   By: Garald Balding M.D.   On: 03/27/2016 02:26   Ct Cervical Spine Wo Contrast  Result Date: 03/27/2016 CLINICAL DATA:  Status post fall on left side in bathroom. Concern for head or cervical spine injury. Initial encounter. EXAM: CT HEAD WITHOUT CONTRAST CT CERVICAL SPINE WITHOUT CONTRAST TECHNIQUE: Multidetector CT imaging of  the head and cervical spine was performed following the standard protocol without intravenous contrast. Multiplanar CT image reconstructions of the cervical spine were also generated. COMPARISON:  None. FINDINGS: CT HEAD FINDINGS Brain: No evidence of acute infarction, hemorrhage, hydrocephalus, extra-axial collection or mass lesion/mass effect. Prominence of the ventricles and sulci reflects moderately severe cortical volume loss. Cerebellar atrophy is noted. Scattered periventricular and subcortical matter change likely reflects small vessel ischemic  microangiopathy. The brainstem and fourth ventricle are within normal limits. The basal ganglia are unremarkable in appearance. The cerebral hemispheres demonstrate grossly normal Minehart-white differentiation. No mass effect or midline shift is seen. Vascular: No hyperdense vessel or unexpected calcification. Skull: There is no evidence of fracture; visualized osseous structures are unremarkable in appearance. Sinuses/Orbits: The orbits are within normal limits. The paranasal sinuses and mastoid air cells are well-aerated. Other: Soft tissue swelling is noted overlying the left parietal calvarium. CT CERVICAL SPINE FINDINGS Alignment: There is mild grade 1 retrolisthesis of C4 on C5 and of C5 on C6, with associated disc space narrowing. Skull base and vertebrae: No acute fracture. No primary bone lesion or focal pathologic process. Soft tissues and spinal canal: No prevertebral fluid or swelling. No visible canal hematoma. Disc levels: Multilevel disc space narrowing along the cervical spine, with scattered anterior and posterior disc osteophyte complexes. Degenerative change is noted about the dens. Upper chest: Scarring is noted at the lung apices, with associated calcification at the right lung apex. Calcification is noted at the carotid bifurcations bilaterally. A 1.3 cm hypodensity at the left thyroid lobe is likely benign, given its size. Other: No additional soft tissue abnormalities are seen. IMPRESSION: 1. No evidence of traumatic intracranial injury or fracture. 2. No evidence of fracture or subluxation along the cervical spine. 3. Soft tissue swelling overlying the left parietal calvarium. 4. Moderately severe cortical volume loss and scattered small vessel ischemic microangiopathy. 5. Mild degenerative change along the cervical spine. 6. Scarring at the lung apices, with associated calcification at the right lung apex. 7. Calcification at the carotid bifurcations bilaterally. Carotid ultrasound would be  helpful for further evaluation, when and as deemed clinically appropriate. Electronically Signed   By: Garald Balding M.D.   On: 03/27/2016 02:26   Dg Hip Unilat W Or Wo Pelvis 2-3 Views Left  Result Date: 03/27/2016 CLINICAL DATA:  Status post fall, with left hip pain. Initial encounter. EXAM: DG HIP (WITH OR WITHOUT PELVIS) 2-3V LEFT COMPARISON:  None. FINDINGS: There appears to be a transcervical fracture through the left femoral neck, with superior displacement of the distal femur. The right femoral head remains seated at the acetabulum. Right femoral hardware is grossly unremarkable, though incompletely imaged. Mild degenerative change is noted along the lower lumbar spine. The sacroiliac joints are grossly unremarkable. The visualized bowel gas pattern is within normal limits. IMPRESSION: Apparent transcervical fracture through the left femoral neck, with superior displacement of the distal femur. Electronically Signed   By: Garald Balding M.D.   On: 03/27/2016 02:49    Positive ROS: All other systems have been reviewed and were otherwise negative with the exception of those mentioned in the HPI and as above.  Objective: Labs cbc  Recent Labs  03/27/16 0223  WBC 8.0  HGB 13.5  HCT 39.4  PLT 222    Labs inflam No results for input(s): CRP in the last 72 hours.  Invalid input(s): ESR  Labs coag No results for input(s): INR, PTT in the last 72 hours.  Invalid input(s):  PT   Recent Labs  03/27/16 0223  NA 141  K 3.9  CL 106  CO2 22  GLUCOSE 136*  BUN 16  CREATININE 1.12*  CALCIUM 9.3    Physical Exam: Vitals:   03/27/16 0522 03/27/16 0615  BP: (!) 145/70 (!) 160/59  Pulse: 63 64  Resp: 18 18  Temp:  97.8 F (36.6 C)   General: Alert, no acute distress.  Family at bedside Mental status: Alert and Oriented x3. Hard of hearing Neurologic: Speech Clear and organized, no gross focal findings or movement disorder appreciated. Respiratory: No cyanosis, no use of  accessory musculature Cardiovascular: No pedal edema GI: Abdomen is soft and non-tender, non-distended. Skin: Warm and dry.  No lesions in the area of chief complaint  Extremities: Warm and well perfused w/o edema Psychiatric: Patient is competent for consent with normal mood and affect  MUSCULOSKELETAL:  Left Pain with range of motion.  EHL/FHL, dorsiflexion/plantar flexion intact. Distal sensation intact. Other extremities are atraumatic with painless ROM and NVI.   Prudencio Burly III PA-C 03/27/2016 8:27 AM

## 2016-03-27 NOTE — Progress Notes (Signed)
Orthopedic Tech Progress Note Patient Details:  Jessica GaskinsDorothy R. Booth 1922-08-15 [id number]  Ortho Devices Type of Ortho Device:  (hip abduction pillow) Ortho Device/Splint Location:  (between hips) Ortho Device/Splint Interventions: Application   Terry Bolotin 03/27/2016, 1:19 PM Viewed order from doctor's order list

## 2016-03-27 NOTE — Transfer of Care (Signed)
Immediate Anesthesia Transfer of Care Note  Patient: Jessica Booth  Procedure(s) Performed: Procedure(s): ARTHROPLASTY BIPOLAR HIP (HEMIARTHROPLASTY) (Left)  Patient Location: PACU  Anesthesia Type:General  Level of Consciousness: awake, patient cooperative and confused  Airway & Oxygen Therapy: Patient Spontanous Breathing and Patient connected to nasal cannula oxygen  Post-op Assessment: Report given to RN, Post -op Vital signs reviewed and stable and Patient moving all extremities X 4  Post vital signs: Reviewed and stable  Last Vitals:  Vitals:   03/27/16 0615 03/27/16 1230  BP: (!) 160/59   Pulse: 64   Resp: 18   Temp: 36.6 C 36.1 C    Last Pain:  Vitals:   03/27/16 0955  TempSrc:   PainSc: 8       Patients Stated Pain Goal: 0 (03/27/16 0615)  Complications: No apparent anesthesia complications

## 2016-03-27 NOTE — Anesthesia Procedure Notes (Signed)
Procedure Name: Intubation Date/Time: 03/27/2016 11:20 AM Performed by: Carney Living Pre-anesthesia Checklist: Patient identified, Emergency Drugs available, Suction available, Patient being monitored and Timeout performed Patient Re-evaluated:Patient Re-evaluated prior to inductionOxygen Delivery Method: Circle system utilized Preoxygenation: Pre-oxygenation with 100% oxygen Intubation Type: IV induction Ventilation: Mask ventilation without difficulty Laryngoscope Size: Mac and 4 Grade View: Grade I Tube type: Oral Tube size: 7.0 mm Number of attempts: 1 Airway Equipment and Method: Stylet Placement Confirmation: ETT inserted through vocal cords under direct vision,  positive ETCO2 and breath sounds checked- equal and bilateral Secured at: 21 cm Tube secured with: Tape Dental Injury: Teeth and Oropharynx as per pre-operative assessment

## 2016-03-27 NOTE — Progress Notes (Signed)
[facility] PROGRESS NOTE  Jessica Booth ZOX:[id number] DOB: 11/30/1922 DOA: 03/27/2016  PCP: Vivien Presto, MD  Brief History/Interval Summary: 83 year old Caucasian female with a past medical history of coronary artery disease status post stenting, hypertension, presented after a fall at home. This resulted in left hip fracture. Patient apparently had been feeling dizzy on and off and had a syncopal episode resulting in the fall.  Reason for Visit: Left hip fracture. Syncope.  Consultants: Orthopedics  Procedures: Plan is for left hip hemiarthroplasty  Antibiotics: None  Subjective/Interval History: Patient complains of pain in her left hip, mainly with movement. Denies any chest pain, shortness of breath, nausea or vomiting. States that she feels as though the room spins around her when she feels dizzy. Has been told of vertigo in the past. She does have problems with years with chronic tinnitus and some hearing impairment. She follows with ENT on a regular basis. Her granddaughter is at the bedside.  ROS: Denies any nausea, vomiting.  Objective:  Vital Signs  Vitals:   03/27/16 0355 03/27/16 0437 03/27/16 0522 03/27/16 0615  BP: (!) 186/129 (!) 176/96 (!) 145/70 (!) 160/59  Pulse: 65 70 63 64  Resp: 16 18 18 18   Temp:    97.8 F (36.6 C)  TempSrc:    Oral  SpO2: 100% 99% 96% 97%  Weight:      Height:        Intake/Output Summary (Last 24 hours) at 03/27/16 1142 Last data filed at 03/27/16 0615  Gross per 24 hour  Intake              100 ml  Output                0 ml  Net              100 ml   Filed Weights   03/27/16 0131  Weight: 49.9 kg (110 lb)    General appearance: alert, cooperative, appears stated age and no distress Head: Normocephalic, without obvious abnormality, atraumatic Resp: clear to auscultation bilaterally Cardio: regular rate and rhythm, S1, S2 normal, no murmur, click, rub or gallop GI: soft, non-tender; bowel sounds normal;  no masses,  no organomegaly Extremities: Left lower extremity is externally rotated. No edema is noted. Neurologic: Awake and alert. Oriented to person, place. No obvious focal neurological deficits present.  Lab Results:  Data Reviewed: I have personally reviewed following labs and imaging studies  CBC:  Recent Labs Lab 03/27/16 0223 03/27/16 0759  WBC 8.0 10.0  NEUTROABS 5.4 6.9  HGB 13.5 13.8  HCT 39.4 40.4  MCV 93.4 94.0  PLT 222 216    Basic Metabolic Panel:  Recent Labs Lab 03/27/16 0223 03/27/16 0759  NA 141 142  K 3.9 4.4  CL 106 107  CO2 22 22  GLUCOSE 136* 103*  BUN 16 14  CREATININE 1.12* 1.07*  CALCIUM 9.3 9.3    GFR: Estimated Creatinine Clearance: 27 mL/min (A) (by C-G formula based on SCr of 1.07 mg/dL (H)).  Liver Function Tests:  Recent Labs Lab 03/27/16 0223 03/27/16 0759  AST 52* 49*  ALT 47 46  ALKPHOS 44 42  BILITOT 0.9 1.1  PROT 6.6 6.8  ALBUMIN 4.2 4.2     Radiology Studies: Dg Chest 1 View  Result Date: 03/27/2016 CLINICAL DATA:  Chronic dizziness. Status post fall onto left side. Concern for chest injury. Initial encounter. EXAM: CHEST 1 VIEW COMPARISON:  Chest radiograph performed 03/21/2014 FINDINGS:  The lungs are well-aerated. Scarring is noted at the right lung apex. There is no evidence of pleural effusion or pneumothorax. The cardiomediastinal silhouette is borderline normal in size. No acute osseous abnormalities are seen. IMPRESSION: Scarring at the right lung apex. No acute cardiopulmonary process seen. Electronically Signed   By: Roanna RaiderJeffery  Chang M.D.   On: 03/27/2016 02:48   Ct Head Wo Contrast  Result Date: 03/27/2016 CLINICAL DATA:  Status post fall on left side in bathroom. Concern for head or cervical spine injury. Initial encounter. EXAM: CT HEAD WITHOUT CONTRAST CT CERVICAL SPINE WITHOUT CONTRAST TECHNIQUE: Multidetector CT imaging of the head and cervical spine was performed following the standard protocol without  intravenous contrast. Multiplanar CT image reconstructions of the cervical spine were also generated. COMPARISON:  None. FINDINGS: CT HEAD FINDINGS Brain: No evidence of acute infarction, hemorrhage, hydrocephalus, extra-axial collection or mass lesion/mass effect. Prominence of the ventricles and sulci reflects moderately severe cortical volume loss. Cerebellar atrophy is noted. Scattered periventricular and subcortical matter change likely reflects small vessel ischemic microangiopathy. The brainstem and fourth ventricle are within normal limits. The basal ganglia are unremarkable in appearance. The cerebral hemispheres demonstrate grossly normal Salo-white differentiation. No mass effect or midline shift is seen. Vascular: No hyperdense vessel or unexpected calcification. Skull: There is no evidence of fracture; visualized osseous structures are unremarkable in appearance. Sinuses/Orbits: The orbits are within normal limits. The paranasal sinuses and mastoid air cells are well-aerated. Other: Soft tissue swelling is noted overlying the left parietal calvarium. CT CERVICAL SPINE FINDINGS Alignment: There is mild grade 1 retrolisthesis of C4 on C5 and of C5 on C6, with associated disc space narrowing. Skull base and vertebrae: No acute fracture. No primary bone lesion or focal pathologic process. Soft tissues and spinal canal: No prevertebral fluid or swelling. No visible canal hematoma. Disc levels: Multilevel disc space narrowing along the cervical spine, with scattered anterior and posterior disc osteophyte complexes. Degenerative change is noted about the dens. Upper chest: Scarring is noted at the lung apices, with associated calcification at the right lung apex. Calcification is noted at the carotid bifurcations bilaterally. A 1.3 cm hypodensity at the left thyroid lobe is likely benign, given its size. Other: No additional soft tissue abnormalities are seen. IMPRESSION: 1. No evidence of traumatic  intracranial injury or fracture. 2. No evidence of fracture or subluxation along the cervical spine. 3. Soft tissue swelling overlying the left parietal calvarium. 4. Moderately severe cortical volume loss and scattered small vessel ischemic microangiopathy. 5. Mild degenerative change along the cervical spine. 6. Scarring at the lung apices, with associated calcification at the right lung apex. 7. Calcification at the carotid bifurcations bilaterally. Carotid ultrasound would be helpful for further evaluation, when and as deemed clinically appropriate. Electronically Signed   By: Roanna RaiderJeffery  Chang M.D.   On: 03/27/2016 02:26   Ct Cervical Spine Wo Contrast  Result Date: 03/27/2016 CLINICAL DATA:  Status post fall on left side in bathroom. Concern for head or cervical spine injury. Initial encounter. EXAM: CT HEAD WITHOUT CONTRAST CT CERVICAL SPINE WITHOUT CONTRAST TECHNIQUE: Multidetector CT imaging of the head and cervical spine was performed following the standard protocol without intravenous contrast. Multiplanar CT image reconstructions of the cervical spine were also generated. COMPARISON:  None. FINDINGS: CT HEAD FINDINGS Brain: No evidence of acute infarction, hemorrhage, hydrocephalus, extra-axial collection or mass lesion/mass effect. Prominence of the ventricles and sulci reflects moderately severe cortical volume loss. Cerebellar atrophy is noted. Scattered periventricular and subcortical  matter change likely reflects small vessel ischemic microangiopathy. The brainstem and fourth ventricle are within normal limits. The basal ganglia are unremarkable in appearance. The cerebral hemispheres demonstrate grossly normal Cyr-white differentiation. No mass effect or midline shift is seen. Vascular: No hyperdense vessel or unexpected calcification. Skull: There is no evidence of fracture; visualized osseous structures are unremarkable in appearance. Sinuses/Orbits: The orbits are within normal limits. The  paranasal sinuses and mastoid air cells are well-aerated. Other: Soft tissue swelling is noted overlying the left parietal calvarium. CT CERVICAL SPINE FINDINGS Alignment: There is mild grade 1 retrolisthesis of C4 on C5 and of C5 on C6, with associated disc space narrowing. Skull base and vertebrae: No acute fracture. No primary bone lesion or focal pathologic process. Soft tissues and spinal canal: No prevertebral fluid or swelling. No visible canal hematoma. Disc levels: Multilevel disc space narrowing along the cervical spine, with scattered anterior and posterior disc osteophyte complexes. Degenerative change is noted about the dens. Upper chest: Scarring is noted at the lung apices, with associated calcification at the right lung apex. Calcification is noted at the carotid bifurcations bilaterally. A 1.3 cm hypodensity at the left thyroid lobe is likely benign, given its size. Other: No additional soft tissue abnormalities are seen. IMPRESSION: 1. No evidence of traumatic intracranial injury or fracture. 2. No evidence of fracture or subluxation along the cervical spine. 3. Soft tissue swelling overlying the left parietal calvarium. 4. Moderately severe cortical volume loss and scattered small vessel ischemic microangiopathy. 5. Mild degenerative change along the cervical spine. 6. Scarring at the lung apices, with associated calcification at the right lung apex. 7. Calcification at the carotid bifurcations bilaterally. Carotid ultrasound would be helpful for further evaluation, when and as deemed clinically appropriate. Electronically Signed   By: Roanna Raider M.D.   On: 03/27/2016 02:26   Dg Hip Unilat W Or Wo Pelvis 2-3 Views Left  Result Date: 03/27/2016 CLINICAL DATA:  Status post fall, with left hip pain. Initial encounter. EXAM: DG HIP (WITH OR WITHOUT PELVIS) 2-3V LEFT COMPARISON:  None. FINDINGS: There appears to be a transcervical fracture through the left femoral neck, with superior  displacement of the distal femur. The right femoral head remains seated at the acetabulum. Right femoral hardware is grossly unremarkable, though incompletely imaged. Mild degenerative change is noted along the lower lumbar spine. The sacroiliac joints are grossly unremarkable. The visualized bowel gas pattern is within normal limits. IMPRESSION: Apparent transcervical fracture through the left femoral neck, with superior displacement of the distal femur. Electronically Signed   By: Roanna Raider M.D.   On: 03/27/2016 02:49     Medications:  Scheduled: . chlorhexidine  60 mL Topical Once  . [MAR Hold] metoprolol  2.5 mg Intravenous Q8H  . povidone-iodine  2 application Topical Once  . [MAR Hold] tranexamic acid  1,000 mg Intravenous To OR  . [MAR Hold] tranexamic acid (CYKLOKAPRON) topical -INTRAOP  2,000 mg Topical Once  . vancomycin  1,000 mg Intravenous On Call to OR   Continuous: . lactated ringers 50 mL/hr at 03/27/16 1102   PRN:[MAR Hold] hydrALAZINE, [MAR Hold] meclizine, [MAR Hold]  morphine injection  Assessment/Plan:  Active Problems:   LEFT Femoral neck Hip fracture (HCC)   Essential hypertension   CAD (coronary artery disease)   Transcervical fracture of femur, left, closed, initial encounter (HCC)    Left femoral fracture status post fall Patient seen by orthopedics. Plan is for surgical intervention today. Deemed to be moderate risk  for an intermediate risk procedure. Pain control.   Syncope, likely secondary to vertigo EKG did not show any ischemic changes. Syncope occurred in the setting of "dizziness". This she describes as a sensation of room spinning around her. She has long-standing ear problems. Most likely she had vertigo resulting in a fall. Continue to monitor on telemetry. There could also be an element of orthostatic hypotension.   History of essential hypertension According to the patient's granddaughter, patient has high blood pressure and has not been  taking her medications and she started taking her medications about 2 weeks ago. This could've dropped her blood pressure resulting in syncope. Continue to hold hydrochlorothiazide.  Continue metoprolol intravenously until she can take by mouth. Hydralazine as needed.  History of CAD Appears to be stable from a cardiac standpoint. Denies any chest pains.  DVT Prophylaxis: Definitive prophylaxis postoperatively    Code Status: DO NOT RESUSCITATE  Family Communication: Discussed with the patient and her granddaughter  Disposition Plan: Await surgery. Most likely will need to go to skilled nursing facility.    LOS: 0 days   [facility]  [facility] Pager [phone] 03/27/2016, 11:42 AM  If 7PM-7AM, please contact night-coverage at [email or web address], password [facility]

## 2016-03-27 NOTE — ED Notes (Signed)
Patient transported to CT 

## 2016-03-27 NOTE — ED Notes (Signed)
Patient states she could hold her urine incont . Linens changed, states her hip isn't hurting as long as she is laying still, when she moves it hurts.

## 2016-03-27 NOTE — Op Note (Signed)
Date of Surgery: 03/27/2016  INDICATIONS: Ms. Wallace CullensGray is a 83 y.o.-year-old female who had a ground-level fall sustaining a left femoral neck fracture displaced.Marland Kitchen.  PREOPERATIVE DIAGNOSIS: Displaced left femoral neck fracture.  POSTOPERATIVE DIAGNOSIS: Same.  PROCEDURE: Hemiarthroplasty femoral neck fracture   SURGEON: Lajoyce Cornersuda, M.D.  ANESTHESIA:  general  IV FLUIDS AND URINE: See anesthesia.  ESTIMATED BLOOD LOSS: 75 ml  COMPLICATIONS: None.  COMPONENT SIZES: Depew size 3 stem, 47 mm ball, +0 neck.  DESCRIPTION OF PROCEDURE: The patient was brought to the operating room and underwent a general anesthetic. After adequate levels of anesthesia were obtained patient was placed in the lateral decubitus position with the hip fracture side up and axillary roll was placed the arms were padded and the mark II supports were placed. The lower extremity was prepped using DuraPrep draped into a sterile field Ioban was used to cover all exposed skin. A timeout was called. A posterior lateral incision was made this was carried down through the tensor fascia lata which was split. Retractors were placed. The piriformis short external rotators and capsule was incised off the femoral neck and retracted with Ethibond suture. The femoral neck cut was made 1 cm proximal to the calcar. The head was delivered. The head was sized and the appropriate size had a good suction fit. The femoral canal was sequentially broached to the proper size. The hip was trialed and this had a good stable fit. The wound was irrigated with normal saline. The femoral component and head were inserted this was again reduced. The hip was stable. The wound was irrigated with normal saline. The capsule short external rotators and piriformis were reapproximated with #1 Ethibond. The tensor fascia lata was closed using #1 Vicryl. Subcutaneous was closed using 0 Vicryl the skin was closed using staples. A Mepilex dressing was applied. Patient was  extubated taken to the PACU in stable condition. Aldean BakerMarcus Rimsha Trembley, MD [facility], [facility] 12:19 PM

## 2016-03-27 NOTE — ED Notes (Signed)
Granddaughter called spoke with patient will come as soon as she can.

## 2016-03-27 NOTE — Consult Note (Signed)
ORTHOPAEDIC CONSULTATION  REQUESTING PHYSICIAN: Osvaldo ShipperGokul Krishnan, MD  Chief Complaint: Left hip pain status post ground level fall.  HPI: Jessica Booth is a 83 y.o. female who presents with left femoral neck fracture. She is status post intertrochanteric fracture on the right. Patient is status post ground-level fall with no other symptoms.  Past Medical History:  Diagnosis Date  . Abnormal liver function test   . Coronary artery disease 2004   stent RCA  . Hyperlipidemia   . Hypertension   . Irritable bowel syndrome    Past Surgical History:  Procedure Laterality Date  . FEMUR FRACTURE SURGERY     Social History   Social History  . Marital status: Married    Spouse name: N/A  . Number of children: N/A  . Years of education: N/A   Social History Main Topics  . Smoking status: Never Smoker  . Smokeless tobacco: Never Used  . Alcohol use Yes     Comment: beer rarely  . Drug use: Unknown  . Sexual activity: Not Asked   Other Topics Concern  . None   Social History Narrative  . None   Family History  Problem Relation Age of Onset  . Hypertension Sister    - negative except otherwise stated in the family history section Allergies  Allergen Reactions  . Penicillins Hives and Rash   Prior to Admission medications   Medication Sig Start Date End Date Taking? Authorizing Provider  aspirin EC 81 MG tablet Take 81 mg by mouth daily.   Yes Historical Provider, MD  clotrimazole (MYCELEX) 10 MG troche Take 10 mg by mouth 5 (five) times daily.   Yes Historical Provider, MD  metoprolol tartrate (LOPRESSOR) 25 MG tablet Take 25 mg by mouth 2 (two) times daily.   Yes Historical Provider, MD   Dg Chest 1 View  Result Date: 03/27/2016 CLINICAL DATA:  Chronic dizziness. Status post fall onto left side. Concern for chest injury. Initial encounter. EXAM: CHEST 1 VIEW COMPARISON:  Chest radiograph performed 03/21/2014 FINDINGS: The lungs are well-aerated. Scarring is noted at  the right lung apex. There is no evidence of pleural effusion or pneumothorax. The cardiomediastinal silhouette is borderline normal in size. No acute osseous abnormalities are seen. IMPRESSION: Scarring at the right lung apex. No acute cardiopulmonary process seen. Electronically Signed   By: Roanna RaiderJeffery  Chang M.D.   On: 03/27/2016 02:48   Ct Head Wo Contrast  Result Date: 03/27/2016 CLINICAL DATA:  Status post fall on left side in bathroom. Concern for head or cervical spine injury. Initial encounter. EXAM: CT HEAD WITHOUT CONTRAST CT CERVICAL SPINE WITHOUT CONTRAST TECHNIQUE: Multidetector CT imaging of the head and cervical spine was performed following the standard protocol without intravenous contrast. Multiplanar CT image reconstructions of the cervical spine were also generated. COMPARISON:  None. FINDINGS: CT HEAD FINDINGS Brain: No evidence of acute infarction, hemorrhage, hydrocephalus, extra-axial collection or mass lesion/mass effect. Prominence of the ventricles and sulci reflects moderately severe cortical volume loss. Cerebellar atrophy is noted. Scattered periventricular and subcortical matter change likely reflects small vessel ischemic microangiopathy. The brainstem and fourth ventricle are within normal limits. The basal ganglia are unremarkable in appearance. The cerebral hemispheres demonstrate grossly normal Berhow-white differentiation. No mass effect or midline shift is seen. Vascular: No hyperdense vessel or unexpected calcification. Skull: There is no evidence of fracture; visualized osseous structures are unremarkable in appearance. Sinuses/Orbits: The orbits are within normal limits. The paranasal sinuses and mastoid air cells are  well-aerated. Other: Soft tissue swelling is noted overlying the left parietal calvarium. CT CERVICAL SPINE FINDINGS Alignment: There is mild grade 1 retrolisthesis of C4 on C5 and of C5 on C6, with associated disc space narrowing. Skull base and vertebrae: No  acute fracture. No primary bone lesion or focal pathologic process. Soft tissues and spinal canal: No prevertebral fluid or swelling. No visible canal hematoma. Disc levels: Multilevel disc space narrowing along the cervical spine, with scattered anterior and posterior disc osteophyte complexes. Degenerative change is noted about the dens. Upper chest: Scarring is noted at the lung apices, with associated calcification at the right lung apex. Calcification is noted at the carotid bifurcations bilaterally. A 1.3 cm hypodensity at the left thyroid lobe is likely benign, given its size. Other: No additional soft tissue abnormalities are seen. IMPRESSION: 1. No evidence of traumatic intracranial injury or fracture. 2. No evidence of fracture or subluxation along the cervical spine. 3. Soft tissue swelling overlying the left parietal calvarium. 4. Moderately severe cortical volume loss and scattered small vessel ischemic microangiopathy. 5. Mild degenerative change along the cervical spine. 6. Scarring at the lung apices, with associated calcification at the right lung apex. 7. Calcification at the carotid bifurcations bilaterally. Carotid ultrasound would be helpful for further evaluation, when and as deemed clinically appropriate. Electronically Signed   By: Roanna Raider M.D.   On: 03/27/2016 02:26   Ct Cervical Spine Wo Contrast  Result Date: 03/27/2016 CLINICAL DATA:  Status post fall on left side in bathroom. Concern for head or cervical spine injury. Initial encounter. EXAM: CT HEAD WITHOUT CONTRAST CT CERVICAL SPINE WITHOUT CONTRAST TECHNIQUE: Multidetector CT imaging of the head and cervical spine was performed following the standard protocol without intravenous contrast. Multiplanar CT image reconstructions of the cervical spine were also generated. COMPARISON:  None. FINDINGS: CT HEAD FINDINGS Brain: No evidence of acute infarction, hemorrhage, hydrocephalus, extra-axial collection or mass lesion/mass  effect. Prominence of the ventricles and sulci reflects moderately severe cortical volume loss. Cerebellar atrophy is noted. Scattered periventricular and subcortical matter change likely reflects small vessel ischemic microangiopathy. The brainstem and fourth ventricle are within normal limits. The basal ganglia are unremarkable in appearance. The cerebral hemispheres demonstrate grossly normal Kray-white differentiation. No mass effect or midline shift is seen. Vascular: No hyperdense vessel or unexpected calcification. Skull: There is no evidence of fracture; visualized osseous structures are unremarkable in appearance. Sinuses/Orbits: The orbits are within normal limits. The paranasal sinuses and mastoid air cells are well-aerated. Other: Soft tissue swelling is noted overlying the left parietal calvarium. CT CERVICAL SPINE FINDINGS Alignment: There is mild grade 1 retrolisthesis of C4 on C5 and of C5 on C6, with associated disc space narrowing. Skull base and vertebrae: No acute fracture. No primary bone lesion or focal pathologic process. Soft tissues and spinal canal: No prevertebral fluid or swelling. No visible canal hematoma. Disc levels: Multilevel disc space narrowing along the cervical spine, with scattered anterior and posterior disc osteophyte complexes. Degenerative change is noted about the dens. Upper chest: Scarring is noted at the lung apices, with associated calcification at the right lung apex. Calcification is noted at the carotid bifurcations bilaterally. A 1.3 cm hypodensity at the left thyroid lobe is likely benign, given its size. Other: No additional soft tissue abnormalities are seen. IMPRESSION: 1. No evidence of traumatic intracranial injury or fracture. 2. No evidence of fracture or subluxation along the cervical spine. 3. Soft tissue swelling overlying the left parietal calvarium. 4. Moderately severe  cortical volume loss and scattered small vessel ischemic microangiopathy. 5. Mild  degenerative change along the cervical spine. 6. Scarring at the lung apices, with associated calcification at the right lung apex. 7. Calcification at the carotid bifurcations bilaterally. Carotid ultrasound would be helpful for further evaluation, when and as deemed clinically appropriate. Electronically Signed   By: Roanna Raider M.D.   On: 03/27/2016 02:26   Dg Hip Unilat W Or Wo Pelvis 2-3 Views Left  Result Date: 03/27/2016 CLINICAL DATA:  Status post fall, with left hip pain. Initial encounter. EXAM: DG HIP (WITH OR WITHOUT PELVIS) 2-3V LEFT COMPARISON:  None. FINDINGS: There appears to be a transcervical fracture through the left femoral neck, with superior displacement of the distal femur. The right femoral head remains seated at the acetabulum. Right femoral hardware is grossly unremarkable, though incompletely imaged. Mild degenerative change is noted along the lower lumbar spine. The sacroiliac joints are grossly unremarkable. The visualized bowel gas pattern is within normal limits. IMPRESSION: Apparent transcervical fracture through the left femoral neck, with superior displacement of the distal femur. Electronically Signed   By: Roanna Raider M.D.   On: 03/27/2016 02:49   - pertinent xrays, CT, MRI studies were reviewed and independently interpreted  Positive ROS: All other systems have been reviewed and were otherwise negative with the exception of those mentioned in the HPI and as above.  Physical Exam: General: Alert, no acute distress Psychiatric: Patient is competent for consent with normal mood and affect Lymphatic: No axillary or cervical lymphadenopathy Cardiovascular: No pedal edema Respiratory: No cyanosis, no use of accessory musculature GI: No organomegaly, abdomen is soft and non-tender  Skin: Skin is intact no skin tears.   Neurologic: Patient does not have protective sensation bilateral lower extremities.   MUSCULOSKELETAL:  Examination patient has a shortened  and externally rotated left lower extremity. She has pain with attempted range of motion of the left hip. Radiographs shows a displaced femoral neck fracture on the left with an intramedullary nail for a right intertrochanteric fracture.  Assessment: Assessment: Left femoral neck fracture.  Plan: Plan: We'll plan for left hip hemiarthroplasty. Risk and benefits were discussed including infection neurovascular injury DVT dislocation mortality need for additional surgery. Patient states she understands wish proceed at this time.  Thank you for the consult and the opportunity to see Ms. Jesilyn Easom, MD [facility] [phone] 10:35 AM

## 2016-03-27 NOTE — ED Notes (Signed)
The pt is fiddling with the pulse ox  Causing it to read in the low 80s  Not accurate  pleth not reading

## 2016-03-27 NOTE — Progress Notes (Signed)
Patient has allergy to penicillins and Ancef 2g ordered.  Dr. Lajoyce Cornersuda made aware and said to proceed with Ancef 2g.

## 2016-03-27 NOTE — Anesthesia Postprocedure Evaluation (Signed)
Anesthesia Post Note  Patient: Lucia GaskinsDorothy R. Sabatino  Procedure(s) Performed: Procedure(s) (LRB): ARTHROPLASTY BIPOLAR HIP (HEMIARTHROPLASTY) (Left)  Patient location during evaluation: PACU Anesthesia Type: General Level of consciousness: awake Pain management: pain level controlled Respiratory status: spontaneous breathing Cardiovascular status: stable Anesthetic complications: no       Last Vitals:  Vitals:   03/27/16 0522 03/27/16 0615  BP: (!) 145/70 (!) 160/59  Pulse: 63 64  Resp: 18 18  Temp:  36.6 C    Last Pain:  Vitals:   03/27/16 0955  TempSrc:   PainSc: 8                  [place]

## 2016-03-27 NOTE — H&P (Signed)
History and Physical    Jessica Booth ZOX:[id number] DOB: 10/09/1922 DOA: 03/27/2016  PCP: Vivien Presto, MD  Patient coming from: Home.  Chief Complaint: Fall.  HPI: Jessica Booth is a 83 y.o. female with history of CAD status post stenting, hypertension presents to the ER after patient had a fall at home. Patient states over the last few weeks patient has been feeling dizzy off and on when standing up. Yesterday morning when patient stood up and was walking to the kitchen patient was feeling dizzy and suddenly passed out. She did hit her head. After waking up patient alerted the emergency and patient was brought to the ER. Patient did have some palpitations prior to the fall denies any chest pain shortness of breath nausea vomiting headache visual symptoms.   ED Course: CT of the head and neck was unremarkable. X-ray of the left hip shows fracture and Dr. Eulah Pont was consulted. EKG shows normal sinus rhythm.  Review of Systems: As per HPI, rest all negative.   Past Medical History:  Diagnosis Date  . Abnormal liver function test   . Coronary artery disease 2004   stent RCA  . Hyperlipidemia   . Hypertension   . Irritable bowel syndrome     Past Surgical History:  Procedure Laterality Date  . FEMUR FRACTURE SURGERY       reports that she has never smoked. She has never used smokeless tobacco. She reports that she drinks alcohol. Her drug history is not on file.  Allergies  Allergen Reactions  . Penicillins Hives and Rash    Family History  Problem Relation Age of Onset  . Hypertension Sister     Prior to Admission medications   Medication Sig Start Date End Date Taking? Authorizing Provider  aspirin EC 81 MG tablet Take 81 mg by mouth daily.   Yes Historical Provider, MD  hydrochlorothiazide (HYDRODIURIL) 25 MG tablet Take 25 mg by mouth daily.   Yes Historical Provider, MD  oxyCODONE-acetaminophen (PERCOCET/ROXICET) 5-325 MG per tablet 1 to 2 tabs PO q6hrs   PRN for pain 03/22/14  Yes Nicole Pisciotta, PA-C  ALPRAZolam (XANAX) 0.25 MG tablet Take 0.25 mg by mouth at bedtime as needed for anxiety (anxiety).    Historical Provider, MD  cephALEXin (KEFLEX) 500 MG capsule Take 1 capsule (500 mg total) by mouth 2 (two) times daily. 03/22/14   Nicole Pisciotta, PA-C  clotrimazole (MYCELEX) 10 MG troche Take 10 mg by mouth 5 (five) times daily.    Historical Provider, MD  metoprolol tartrate (LOPRESSOR) 25 MG tablet Take 25 mg by mouth 2 (two) times daily.    Historical Provider, MD  ranitidine (ZANTAC) 150 MG tablet Take 150 mg by mouth 2 (two) times daily.    Historical Provider, MD  traMADol (ULTRAM) 50 MG tablet Take 50 mg by mouth every 6 (six) hours as needed for moderate pain or severe pain (pain).    Historical Provider, MD    Physical Exam: Vitals:   03/27/16 0355 03/27/16 0437 03/27/16 0522 03/27/16 0615  BP: (!) 186/129 (!) 176/96 (!) 145/70 (!) 160/59  Pulse: 65 70 63 64  Resp: 16 18 18 18   Temp:    97.8 F (36.6 C)  TempSrc:    Oral  SpO2: 100% 99% 96% 97%  Weight:      Height:          Constitutional: Moderately built and nourished. Vitals:   03/27/16 8119 03/27/16 1478 03/27/16 0522 03/27/16 2956  BP: (!) 186/129 (!) 176/96 (!) 145/70 (!) 160/59  Pulse: 65 70 63 64  Resp: 16 18 18 18   Temp:    97.8 F (36.6 C)  TempSrc:    Oral  SpO2: 100% 99% 96% 97%  Weight:      Height:       Eyes: Anicteric no pallor. ENMT: No discharge from the ears eyes nose or mouth. Neck: No mass felt. No neck rigidity. Respiratory: No rhonchi or crepitations. Cardiovascular: S1 and S2 heard no murmurs appreciated. Abdomen: Soft nontender bowel sounds present. Musculoskeletal: Pain on moving her left hip. Skin: No rash. Neurologic: Alert awake oriented to time place and person. Moves all extremities. Psychiatric: Appears normal. Normal affect.   Labs on Admission: I have personally reviewed following labs and imaging  studies  CBC:  Recent Labs Lab 03/27/16 0223  WBC 8.0  NEUTROABS 5.4  HGB 13.5  HCT 39.4  MCV 93.4  PLT 222   Basic Metabolic Panel:  Recent Labs Lab 03/27/16 0223  NA 141  K 3.9  CL 106  CO2 22  GLUCOSE 136*  BUN 16  CREATININE 1.12*  CALCIUM 9.3   GFR: Estimated Creatinine Clearance: 25.8 mL/min (A) (by C-G formula based on SCr of 1.12 mg/dL (H)). Liver Function Tests:  Recent Labs Lab 03/27/16 0223  AST 52*  ALT 47  ALKPHOS 44  BILITOT 0.9  PROT 6.6  ALBUMIN 4.2   No results for input(s): LIPASE, AMYLASE in the last 168 hours. No results for input(s): AMMONIA in the last 168 hours. Coagulation Profile: No results for input(s): INR, PROTIME in the last 168 hours. Cardiac Enzymes: No results for input(s): CKTOTAL, CKMB, CKMBINDEX, TROPONINI in the last 168 hours. BNP (last 3 results) No results for input(s): PROBNP in the last 8760 hours. HbA1C: No results for input(s): HGBA1C in the last 72 hours. CBG: No results for input(s): GLUCAP in the last 168 hours. Lipid Profile: No results for input(s): CHOL, HDL, LDLCALC, TRIG, CHOLHDL, LDLDIRECT in the last 72 hours. Thyroid Function Tests: No results for input(s): TSH, T4TOTAL, FREET4, T3FREE, THYROIDAB in the last 72 hours. Anemia Panel: No results for input(s): VITAMINB12, FOLATE, FERRITIN, TIBC, IRON, RETICCTPCT in the last 72 hours. Urine analysis:    Component Value Date/Time   COLORURINE YELLOW 03/27/2016 0203   APPEARANCEUR CLEAR 03/27/2016 0203   LABSPEC 1.010 03/27/2016 0203   PHURINE 7.5 03/27/2016 0203   GLUCOSEU NEGATIVE 03/27/2016 0203   HGBUR TRACE (A) 03/27/2016 0203   BILIRUBINUR NEGATIVE 03/27/2016 0203   KETONESUR NEGATIVE 03/27/2016 0203   PROTEINUR NEGATIVE 03/27/2016 0203   UROBILINOGEN 0.2 03/21/2014 2257   NITRITE NEGATIVE 03/27/2016 0203   LEUKOCYTESUR SMALL (A) 03/27/2016 0203   Sepsis Labs: @LABRCNTIP (procalcitonin:4,lacticidven:4) )No results found for this or any  previous visit (from the past 240 hour(s)).   Radiological Exams on Admission: Dg Chest 1 View  Result Date: 03/27/2016 CLINICAL DATA:  Chronic dizziness. Status post fall onto left side. Concern for chest injury. Initial encounter. EXAM: CHEST 1 VIEW COMPARISON:  Chest radiograph performed 03/21/2014 FINDINGS: The lungs are well-aerated. Scarring is noted at the right lung apex. There is no evidence of pleural effusion or pneumothorax. The cardiomediastinal silhouette is borderline normal in size. No acute osseous abnormalities are seen. IMPRESSION: Scarring at the right lung apex. No acute cardiopulmonary process seen. Electronically Signed   By: Roanna Raider M.D.   On: 03/27/2016 02:48   Ct Head Wo Contrast  Result Date: 03/27/2016 CLINICAL DATA:  Status post fall on left side in bathroom. Concern for head or cervical spine injury. Initial encounter. EXAM: CT HEAD WITHOUT CONTRAST CT CERVICAL SPINE WITHOUT CONTRAST TECHNIQUE: Multidetector CT imaging of the head and cervical spine was performed following the standard protocol without intravenous contrast. Multiplanar CT image reconstructions of the cervical spine were also generated. COMPARISON:  None. FINDINGS: CT HEAD FINDINGS Brain: No evidence of acute infarction, hemorrhage, hydrocephalus, extra-axial collection or mass lesion/mass effect. Prominence of the ventricles and sulci reflects moderately severe cortical volume loss. Cerebellar atrophy is noted. Scattered periventricular and subcortical matter change likely reflects small vessel ischemic microangiopathy. The brainstem and fourth ventricle are within normal limits. The basal ganglia are unremarkable in appearance. The cerebral hemispheres demonstrate grossly normal Eaves-white differentiation. No mass effect or midline shift is seen. Vascular: No hyperdense vessel or unexpected calcification. Skull: There is no evidence of fracture; visualized osseous structures are unremarkable in  appearance. Sinuses/Orbits: The orbits are within normal limits. The paranasal sinuses and mastoid air cells are well-aerated. Other: Soft tissue swelling is noted overlying the left parietal calvarium. CT CERVICAL SPINE FINDINGS Alignment: There is mild grade 1 retrolisthesis of C4 on C5 and of C5 on C6, with associated disc space narrowing. Skull base and vertebrae: No acute fracture. No primary bone lesion or focal pathologic process. Soft tissues and spinal canal: No prevertebral fluid or swelling. No visible canal hematoma. Disc levels: Multilevel disc space narrowing along the cervical spine, with scattered anterior and posterior disc osteophyte complexes. Degenerative change is noted about the dens. Upper chest: Scarring is noted at the lung apices, with associated calcification at the right lung apex. Calcification is noted at the carotid bifurcations bilaterally. A 1.3 cm hypodensity at the left thyroid lobe is likely benign, given its size. Other: No additional soft tissue abnormalities are seen. IMPRESSION: 1. No evidence of traumatic intracranial injury or fracture. 2. No evidence of fracture or subluxation along the cervical spine. 3. Soft tissue swelling overlying the left parietal calvarium. 4. Moderately severe cortical volume loss and scattered small vessel ischemic microangiopathy. 5. Mild degenerative change along the cervical spine. 6. Scarring at the lung apices, with associated calcification at the right lung apex. 7. Calcification at the carotid bifurcations bilaterally. Carotid ultrasound would be helpful for further evaluation, when and as deemed clinically appropriate. Electronically Signed   By: Roanna RaiderJeffery  Chang M.D.   On: 03/27/2016 02:26   Ct Cervical Spine Wo Contrast  Result Date: 03/27/2016 CLINICAL DATA:  Status post fall on left side in bathroom. Concern for head or cervical spine injury. Initial encounter. EXAM: CT HEAD WITHOUT CONTRAST CT CERVICAL SPINE WITHOUT CONTRAST  TECHNIQUE: Multidetector CT imaging of the head and cervical spine was performed following the standard protocol without intravenous contrast. Multiplanar CT image reconstructions of the cervical spine were also generated. COMPARISON:  None. FINDINGS: CT HEAD FINDINGS Brain: No evidence of acute infarction, hemorrhage, hydrocephalus, extra-axial collection or mass lesion/mass effect. Prominence of the ventricles and sulci reflects moderately severe cortical volume loss. Cerebellar atrophy is noted. Scattered periventricular and subcortical matter change likely reflects small vessel ischemic microangiopathy. The brainstem and fourth ventricle are within normal limits. The basal ganglia are unremarkable in appearance. The cerebral hemispheres demonstrate grossly normal Weckwerth-white differentiation. No mass effect or midline shift is seen. Vascular: No hyperdense vessel or unexpected calcification. Skull: There is no evidence of fracture; visualized osseous structures are unremarkable in appearance. Sinuses/Orbits: The orbits are within normal limits. The paranasal sinuses and mastoid air  cells are well-aerated. Other: Soft tissue swelling is noted overlying the left parietal calvarium. CT CERVICAL SPINE FINDINGS Alignment: There is mild grade 1 retrolisthesis of C4 on C5 and of C5 on C6, with associated disc space narrowing. Skull base and vertebrae: No acute fracture. No primary bone lesion or focal pathologic process. Soft tissues and spinal canal: No prevertebral fluid or swelling. No visible canal hematoma. Disc levels: Multilevel disc space narrowing along the cervical spine, with scattered anterior and posterior disc osteophyte complexes. Degenerative change is noted about the dens. Upper chest: Scarring is noted at the lung apices, with associated calcification at the right lung apex. Calcification is noted at the carotid bifurcations bilaterally. A 1.3 cm hypodensity at the left thyroid lobe is likely benign,  given its size. Other: No additional soft tissue abnormalities are seen. IMPRESSION: 1. No evidence of traumatic intracranial injury or fracture. 2. No evidence of fracture or subluxation along the cervical spine. 3. Soft tissue swelling overlying the left parietal calvarium. 4. Moderately severe cortical volume loss and scattered small vessel ischemic microangiopathy. 5. Mild degenerative change along the cervical spine. 6. Scarring at the lung apices, with associated calcification at the right lung apex. 7. Calcification at the carotid bifurcations bilaterally. Carotid ultrasound would be helpful for further evaluation, when and as deemed clinically appropriate. Electronically Signed   By: Roanna Raider M.D.   On: 03/27/2016 02:26   Dg Hip Unilat W Or Wo Pelvis 2-3 Views Left  Result Date: 03/27/2016 CLINICAL DATA:  Status post fall, with left hip pain. Initial encounter. EXAM: DG HIP (WITH OR WITHOUT PELVIS) 2-3V LEFT COMPARISON:  None. FINDINGS: There appears to be a transcervical fracture through the left femoral neck, with superior displacement of the distal femur. The right femoral head remains seated at the acetabulum. Right femoral hardware is grossly unremarkable, though incompletely imaged. Mild degenerative change is noted along the lower lumbar spine. The sacroiliac joints are grossly unremarkable. The visualized bowel gas pattern is within normal limits. IMPRESSION: Apparent transcervical fracture through the left femoral neck, with superior displacement of the distal femur. Electronically Signed   By: Roanna Raider M.D.   On: 03/27/2016 02:49    EKG: Independently reviewed. Normal sinus rhythm QTC 457 ms.  Assessment/Plan Active Problems:   LEFT Femoral neck Hip fracture (HCC)   Essential hypertension   CAD (coronary artery disease)   Transcervical fracture of femur, left, closed, initial encounter (HCC)    1. Left femoral fracture status post fall - patient at this time is at  moderate risk for intermediate risk procedure. Patient denies any chest pain or shortness of breath. Will keep patient nothing by mouth in anticipation of procedure. Dr. Margarita Rana to see patient in consult for possible surgery. 2. Syncope - EKG shows normal sinus rhythm with QTC of 457 ms. Patient denies any chest pain or shortness of breath. We'll closely monitor in telemetry. No obvious murmurs on chest examination. Will hold off hydrochlorothiazide for now. 3. Hypertension - I will place patient on scheduled dose of IV metoprolol with holding orders. Holding off hydrochlorothiazide and gently hydrating. Will keep patient on when necessary IV hydralazine for systolic blood pressure more than 160. Change to by mouth metoprolol after surgery. 4. CAD - denies any chest pain.   DVT prophylaxis: SCDs. Code Status: DO NOT RESUSCITATE.  Family Communication: Discussed with patient.  Disposition Plan: Skilled nursing facility.  Consults called: Orthopedics.  Admission status: Inpatient.    Eduard Clos MD [facility] Pager [phone].  If 7PM-7AM, please contact night-coverage [email or web address] Password [email or web address]  03/27/2016, 7:09 AM

## 2016-03-27 NOTE — Anesthesia Postprocedure Evaluation (Signed)
Anesthesia Post Note  Patient: Lucia Gaskinsorothy R. Llerena  Procedure(s) Performed: Procedure(s) (LRB): ARTHROPLASTY BIPOLAR HIP (HEMIARTHROPLASTY) (Left)  Patient location during evaluation: PACU Anesthesia Type: General Level of consciousness: awake Pain management: pain level controlled Vital Signs Assessment: post-procedure vital signs reviewed and stable Respiratory status: spontaneous breathing Cardiovascular status: stable Anesthetic complications: no       Last Vitals:  Vitals:   03/27/16 1330 03/27/16 1359  BP:  133/81  Pulse:    Resp:    Temp: 36.5 C 36.6 C    Last Pain:  Vitals:   03/27/16 1359  TempSrc: Oral  PainSc:                  Kalin Kyler

## 2016-03-27 NOTE — ED Notes (Signed)
Report called to 5N

## 2016-03-27 NOTE — Progress Notes (Signed)
This Rn asked patient several times if she would like something for pain- Patient stated that she is alright and would not like to take anything. RN will ask again later tonight.

## 2016-03-27 NOTE — Anesthesia Postprocedure Evaluation (Signed)
Anesthesia Post Note  Patient: Jessica Booth  Procedure(s) Performed: Procedure(s) (LRB): ARTHROPLASTY BIPOLAR HIP (HEMIARTHROPLASTY) (Left)  Anesthesia Type: General       Last Vitals:  Vitals:   03/27/16 1330 03/27/16 1359  BP:  133/81  Pulse:    Resp:    Temp: 36.5 C 36.6 C    Last Pain:  Vitals:   03/27/16 1359  TempSrc: Oral  PainSc:                  Ogle Hoeffner

## 2016-03-27 NOTE — ED Notes (Signed)
Pt pulled her C-collar off and would not tolerate it being placed back on her.  Pt is not holding still to allow for acurate BP measurement

## 2016-03-27 NOTE — ED Notes (Signed)
Very hard of hearing

## 2016-03-27 NOTE — ED Provider Notes (Signed)
[facility]-EMERGENCY DEPT Provider Note   CSN: [id number] Arrival date & time: 03/27/16  0117   By signing my name below, I, Soijett Blue, attest that this documentation has been prepared under the direction and in the presence of Gilda Crease, MD. Electronically Signed: Soijett Blue, ED Scribe. 03/27/16. 1:34 AM.  History   Chief Complaint Chief Complaint  Patient presents with  . Fall   LEVEL 5 CAVEAT: OTHER; PT CRYING AND NOT ANSWERING QUESTIONS  HPI Jessica Booth is a 83 y.o. female with a PMHx of HTN, who presents to the Emergency Department brought in by EMS from home complaining of a fall onset PTA. EMS notes that the pt fell in her bathroom onto her left side tonight. Per EMS report, pt was complaining of associated dizziness x 1 month and left hip pain. Pt wasn't given any medications for her symptoms. EMS denies any other symptoms at this time. EMS states that the pt is currently taking plavix.    The history is provided by the patient. No language interpreter was used.    Past Medical History:  Diagnosis Date  . Abnormal liver function test   . Coronary artery disease 2004   stent RCA  . Hyperlipidemia   . Hypertension   . Irritable bowel syndrome     Patient Active Problem List   Diagnosis Date Noted  . Hip fracture (HCC) 03/27/2016    Past Surgical History:  Procedure Laterality Date  . FEMUR FRACTURE SURGERY      OB History    No data available       Home Medications    Prior to Admission medications   Medication Sig Start Date End Date Taking? Authorizing Provider  ALPRAZolam Prudy Feeler) 0.25 MG tablet Take 0.25 mg by mouth at bedtime as needed for anxiety (anxiety).    Historical Provider, MD  aspirin EC 81 MG tablet Take 81 mg by mouth daily.    Historical Provider, MD  cephALEXin (KEFLEX) 500 MG capsule Take 1 capsule (500 mg total) by mouth 2 (two) times daily. 03/22/14   Nicole Pisciotta, PA-C  clotrimazole (MYCELEX) 10 MG troche Take 10  mg by mouth 5 (five) times daily.    Historical Provider, MD  metoprolol tartrate (LOPRESSOR) 25 MG tablet Take 25 mg by mouth 2 (two) times daily.    Historical Provider, MD  oxyCODONE-acetaminophen (PERCOCET/ROXICET) 5-325 MG per tablet 1 to 2 tabs PO q6hrs  PRN for pain 03/22/14   Joni Reining Pisciotta, PA-C  ranitidine (ZANTAC) 150 MG tablet Take 150 mg by mouth 2 (two) times daily.    Historical Provider, MD  traMADol (ULTRAM) 50 MG tablet Take 50 mg by mouth every 6 (six) hours as needed for moderate pain or severe pain (pain).    Historical Provider, MD    Family History Family History  Problem Relation Age of Onset  . Hypertension Sister     Social History Social History  Substance Use Topics  . Smoking status: Never Smoker  . Smokeless tobacco: Never Used  . Alcohol use Yes     Comment: beer rarely     Allergies   Penicillins   Review of Systems Review of Systems  Unable to perform ROS: Other (Pt crying and not answering questions)     Physical Exam Updated Vital Signs BP (!) 176/96 (BP Location: Right Arm)   Pulse 70   Temp 98.4 F (36.9 C) (Axillary)   Resp 18   Ht 5\' 4"  (1.626 m)  Wt 110 lb (49.9 kg)   SpO2 99%   BMI 18.88 kg/m   Physical Exam  Constitutional: She appears well-developed and well-nourished. No distress.  HENT:  Head: Normocephalic and atraumatic.  Right Ear: Hearing normal.  Left Ear: Hearing normal.  Nose: Nose normal.  Mouth/Throat: Oropharynx is clear and moist and mucous membranes are normal.  Eyes: Conjunctivae and EOM are normal. Pupils are equal, round, and reactive to light.  Neck: Normal range of motion. Neck supple.  C-collar in place on arrival, but pt irritated and removed C-collar herself.  Cardiovascular: Regular rhythm, S1 normal and S2 normal.  Exam reveals no gallop and no friction rub.   No murmur heard. Pulmonary/Chest: Effort normal and breath sounds normal. No respiratory distress. She exhibits no tenderness.    Abdominal: Soft. Normal appearance and bowel sounds are normal. There is no hepatosplenomegaly. There is no tenderness. There is no rebound, no guarding, no tenderness at McBurney's point and negative Murphy's sign. No hernia.  Musculoskeletal: Normal range of motion.  Pain with ROM of left hip without obvious deformity.   Neurological: She is alert. She has normal strength. No cranial nerve deficit or sensory deficit. Coordination normal. GCS eye subscore is 4. GCS verbal subscore is 5. GCS motor subscore is 6.  Skin: Skin is warm, dry and intact. No rash noted. No cyanosis.  Psychiatric: She has a normal mood and affect. Her speech is normal and behavior is normal. Thought content normal.  Nursing note and vitals reviewed.    ED Treatments / Results  DIAGNOSTIC STUDIES: Oxygen Saturation is 98% on RA, nl by my interpretation.    COORDINATION OF CARE: 1:30 AM Discussed treatment plan with pt at bedside which includes CT head, CT cervical spine, CXR, hip unilateral with pelvis left xray, labs, UA, EKG, and pt agreed to plan.   Labs (all labs ordered are listed, but only abnormal results are displayed) Labs Reviewed  CBC WITH DIFFERENTIAL/PLATELET - Abnormal; Notable for the following:       Result Value   Monocytes Absolute 1.1 (*)    All other components within normal limits  COMPREHENSIVE METABOLIC PANEL - Abnormal; Notable for the following:    Glucose, Bld 136 (*)    Creatinine, Ser 1.12 (*)    AST 52 (*)    GFR calc non Af Amer 42 (*)    GFR calc Af Amer 48 (*)    All other components within normal limits  URINALYSIS, ROUTINE W REFLEX MICROSCOPIC - Abnormal; Notable for the following:    Hgb urine dipstick TRACE (*)    Leukocytes, UA SMALL (*)    All other components within normal limits    EKG  EKG Interpretation  Date/Time:  Wednesday March 27 2016 02:07:15 EDT Ventricular Rate:  61 PR Interval:    QRS Duration: 95 QT Interval:  453 QTC Calculation: 457 R  Axis:   51 Text Interpretation:  Sinus rhythm Low voltage, extremity and precordial leads Confirmed by Yaneli Keithley  MD, Denver Harder [phone]) on 03/27/2016 2:22:28 AM       Radiology Dg Chest 1 View  Result Date: 03/27/2016 CLINICAL DATA:  Chronic dizziness. Status post fall onto left side. Concern for chest injury. Initial encounter. EXAM: CHEST 1 VIEW COMPARISON:  Chest radiograph performed 03/21/2014 FINDINGS: The lungs are well-aerated. Scarring is noted at the right lung apex. There is no evidence of pleural effusion or pneumothorax. The cardiomediastinal silhouette is borderline normal in size. No acute osseous abnormalities are seen.  IMPRESSION: Scarring at the right lung apex. No acute cardiopulmonary process seen. Electronically Signed   By: Roanna Raider M.D.   On: 03/27/2016 02:48   Ct Head Wo Contrast  Result Date: 03/27/2016 CLINICAL DATA:  Status post fall on left side in bathroom. Concern for head or cervical spine injury. Initial encounter. EXAM: CT HEAD WITHOUT CONTRAST CT CERVICAL SPINE WITHOUT CONTRAST TECHNIQUE: Multidetector CT imaging of the head and cervical spine was performed following the standard protocol without intravenous contrast. Multiplanar CT image reconstructions of the cervical spine were also generated. COMPARISON:  None. FINDINGS: CT HEAD FINDINGS Brain: No evidence of acute infarction, hemorrhage, hydrocephalus, extra-axial collection or mass lesion/mass effect. Prominence of the ventricles and sulci reflects moderately severe cortical volume loss. Cerebellar atrophy is noted. Scattered periventricular and subcortical matter change likely reflects small vessel ischemic microangiopathy. The brainstem and fourth ventricle are within normal limits. The basal ganglia are unremarkable in appearance. The cerebral hemispheres demonstrate grossly normal Verdi-white differentiation. No mass effect or midline shift is seen. Vascular: No hyperdense vessel or unexpected  calcification. Skull: There is no evidence of fracture; visualized osseous structures are unremarkable in appearance. Sinuses/Orbits: The orbits are within normal limits. The paranasal sinuses and mastoid air cells are well-aerated. Other: Soft tissue swelling is noted overlying the left parietal calvarium. CT CERVICAL SPINE FINDINGS Alignment: There is mild grade 1 retrolisthesis of C4 on C5 and of C5 on C6, with associated disc space narrowing. Skull base and vertebrae: No acute fracture. No primary bone lesion or focal pathologic process. Soft tissues and spinal canal: No prevertebral fluid or swelling. No visible canal hematoma. Disc levels: Multilevel disc space narrowing along the cervical spine, with scattered anterior and posterior disc osteophyte complexes. Degenerative change is noted about the dens. Upper chest: Scarring is noted at the lung apices, with associated calcification at the right lung apex. Calcification is noted at the carotid bifurcations bilaterally. A 1.3 cm hypodensity at the left thyroid lobe is likely benign, given its size. Other: No additional soft tissue abnormalities are seen. IMPRESSION: 1. No evidence of traumatic intracranial injury or fracture. 2. No evidence of fracture or subluxation along the cervical spine. 3. Soft tissue swelling overlying the left parietal calvarium. 4. Moderately severe cortical volume loss and scattered small vessel ischemic microangiopathy. 5. Mild degenerative change along the cervical spine. 6. Scarring at the lung apices, with associated calcification at the right lung apex. 7. Calcification at the carotid bifurcations bilaterally. Carotid ultrasound would be helpful for further evaluation, when and as deemed clinically appropriate. Electronically Signed   By: Roanna Raider M.D.   On: 03/27/2016 02:26   Ct Cervical Spine Wo Contrast  Result Date: 03/27/2016 CLINICAL DATA:  Status post fall on left side in bathroom. Concern for head or cervical  spine injury. Initial encounter. EXAM: CT HEAD WITHOUT CONTRAST CT CERVICAL SPINE WITHOUT CONTRAST TECHNIQUE: Multidetector CT imaging of the head and cervical spine was performed following the standard protocol without intravenous contrast. Multiplanar CT image reconstructions of the cervical spine were also generated. COMPARISON:  None. FINDINGS: CT HEAD FINDINGS Brain: No evidence of acute infarction, hemorrhage, hydrocephalus, extra-axial collection or mass lesion/mass effect. Prominence of the ventricles and sulci reflects moderately severe cortical volume loss. Cerebellar atrophy is noted. Scattered periventricular and subcortical matter change likely reflects small vessel ischemic microangiopathy. The brainstem and fourth ventricle are within normal limits. The basal ganglia are unremarkable in appearance. The cerebral hemispheres demonstrate grossly normal Whitelock-white differentiation. No mass effect  or midline shift is seen. Vascular: No hyperdense vessel or unexpected calcification. Skull: There is no evidence of fracture; visualized osseous structures are unremarkable in appearance. Sinuses/Orbits: The orbits are within normal limits. The paranasal sinuses and mastoid air cells are well-aerated. Other: Soft tissue swelling is noted overlying the left parietal calvarium. CT CERVICAL SPINE FINDINGS Alignment: There is mild grade 1 retrolisthesis of C4 on C5 and of C5 on C6, with associated disc space narrowing. Skull base and vertebrae: No acute fracture. No primary bone lesion or focal pathologic process. Soft tissues and spinal canal: No prevertebral fluid or swelling. No visible canal hematoma. Disc levels: Multilevel disc space narrowing along the cervical spine, with scattered anterior and posterior disc osteophyte complexes. Degenerative change is noted about the dens. Upper chest: Scarring is noted at the lung apices, with associated calcification at the right lung apex. Calcification is noted at the  carotid bifurcations bilaterally. A 1.3 cm hypodensity at the left thyroid lobe is likely benign, given its size. Other: No additional soft tissue abnormalities are seen. IMPRESSION: 1. No evidence of traumatic intracranial injury or fracture. 2. No evidence of fracture or subluxation along the cervical spine. 3. Soft tissue swelling overlying the left parietal calvarium. 4. Moderately severe cortical volume loss and scattered small vessel ischemic microangiopathy. 5. Mild degenerative change along the cervical spine. 6. Scarring at the lung apices, with associated calcification at the right lung apex. 7. Calcification at the carotid bifurcations bilaterally. Carotid ultrasound would be helpful for further evaluation, when and as deemed clinically appropriate. Electronically Signed   By: Roanna RaiderJeffery  Chang M.D.   On: 03/27/2016 02:26   Dg Hip Unilat W Or Wo Pelvis 2-3 Views Left  Result Date: 03/27/2016 CLINICAL DATA:  Status post fall, with left hip pain. Initial encounter. EXAM: DG HIP (WITH OR WITHOUT PELVIS) 2-3V LEFT COMPARISON:  None. FINDINGS: There appears to be a transcervical fracture through the left femoral neck, with superior displacement of the distal femur. The right femoral head remains seated at the acetabulum. Right femoral hardware is grossly unremarkable, though incompletely imaged. Mild degenerative change is noted along the lower lumbar spine. The sacroiliac joints are grossly unremarkable. The visualized bowel gas pattern is within normal limits. IMPRESSION: Apparent transcervical fracture through the left femoral neck, with superior displacement of the distal femur. Electronically Signed   By: Roanna RaiderJeffery  Chang M.D.   On: 03/27/2016 02:49    Procedures Procedures (including critical care time)  Medications Ordered in ED Medications  morphine 4 MG/ML injection 2 mg (2 mg Intravenous Given 03/27/16 0427)  ondansetron (ZOFRAN) injection 4 mg (4 mg Intravenous Given 03/27/16 0428)  0.9 %   sodium chloride infusion (1,000 mLs Intravenous New Bag/Given 03/27/16 0427)     Initial Impression / Assessment and Plan / ED Course  I have reviewed the triage vital signs and the nursing notes.  Pertinent labs & imaging results that were available during my care of the patient were reviewed by me and considered in my medical decision making (see chart for details).     Patient presents to the emergency department for evaluation after a ground-level fall. She apparently fell while walking to the bathroom tonight. She reports that she has been feeling weak and dizzy for some time. Patient fell onto her left side, complaining of left hip area pain.  CT head and cervical spine negative. X-ray of left hip reveals a transcervical fracture. [facility]. Will admit to  hospitalist for further treatment of acute hip fracture.  Final Clinical Impressions(s) / ED Diagnoses   Final diagnoses:  Transcervical fracture of femur, left, closed, initial encounter [facility])    New Prescriptions New Prescriptions   No medications on file   I personally performed the services described in this documentation, which was scribed in my presence. The recorded information has been reviewed and is accurate.     Gilda Crease, MD 03/27/16 [phone]

## 2016-03-28 ENCOUNTER — Encounter (HOSPITAL_COMMUNITY): Payer: Self-pay | Admitting: Orthopedic Surgery

## 2016-03-28 ENCOUNTER — Inpatient Hospital Stay (HOSPITAL_COMMUNITY): Payer: Medicare Other

## 2016-03-28 DIAGNOSIS — I639 Cerebral infarction, unspecified: Secondary | ICD-10-CM | POA: Clinically undetermined

## 2016-03-28 DIAGNOSIS — R29898 Other symptoms and signs involving the musculoskeletal system: Secondary | ICD-10-CM

## 2016-03-28 LAB — BASIC METABOLIC PANEL
ANION GAP: 14 (ref 5–15)
BUN: 14 mg/dL (ref 6–20)
CALCIUM: 8.2 mg/dL — AB (ref 8.9–10.3)
CO2: 21 mmol/L — AB (ref 22–32)
Chloride: 105 mmol/L (ref 101–111)
Creatinine, Ser: 1.1 mg/dL — ABNORMAL HIGH (ref 0.44–1.00)
GFR, EST AFRICAN AMERICAN: 49 mL/min — AB (ref >60)
GFR, EST NON AFRICAN AMERICAN: 43 mL/min — AB (ref >60)
GLUCOSE: 111 mg/dL — AB (ref 65–99)
POTASSIUM: 4 mmol/L (ref 3.5–5.1)
Sodium: 140 mmol/L (ref 135–145)

## 2016-03-28 LAB — CBC
HEMATOCRIT: 35.7 % — AB (ref 36.0–46.0)
HEMOGLOBIN: 11.7 g/dL — AB (ref 12.0–15.0)
MCH: 31.5 pg (ref 26.0–34.0)
MCHC: 32.8 g/dL (ref 30.0–36.0)
MCV: 96 fL (ref 78.0–100.0)
Platelets: 187 10*3/uL (ref 150–400)
RBC: 3.72 MIL/uL — AB (ref 3.87–5.11)
RDW: 12.7 % (ref 11.5–15.5)
WBC: 9.9 10*3/uL (ref 4.0–10.5)

## 2016-03-28 MED ORDER — LORAZEPAM 2 MG/ML IJ SOLN
INTRAMUSCULAR | Status: AC
  Filled 2016-03-28: qty 1

## 2016-03-28 MED ORDER — LORAZEPAM 2 MG/ML IJ SOLN: 0.5000 mg | Freq: Once | INTRAMUSCULAR | Status: DC | PRN

## 2016-03-28 MED ORDER — LORAZEPAM 2 MG/ML IJ SOLN
0.5000 mg | Freq: Once | INTRAMUSCULAR | Status: DC
  Filled 2016-03-28: qty 1

## 2016-03-28 NOTE — Evaluation (Signed)
Clinical/Bedside Swallow Evaluation Patient Details  Name: Jessica Booth MRN: [id number] Date of Birth: 03/06/1922  Today's Date: 03/28/2016 Time: SLP Start Time (ACUTE ONLY): 1530 SLP Stop Time (ACUTE ONLY): 1550 SLP Time Calculation (min) (ACUTE ONLY): 20 min  Past Medical History:  Past Medical History:  Diagnosis Date  . Abnormal liver function test   . Coronary artery disease 2004   stent RCA  . Hyperlipidemia   . Hypertension   . Irritable bowel syndrome    Past Surgical History:  Past Surgical History:  Procedure Laterality Date  . FEMUR FRACTURE SURGERY    . HIP ARTHROPLASTY Left 03/27/2016   Procedure: ARTHROPLASTY BIPOLAR HIP (HEMIARTHROPLASTY);  Surgeon: Nadara Mustard, MD;  Location[facility];  Service: Orthopedics;  Laterality: Left;   HPI:  83 year old Caucasian female with a past medical history of coronary artery disease status post stenting, hypertension, admitted with fall, left hip fx, s/p hemiarthroplasty 3/21; MRI 3/22 acute right parietal deep white matter CVA.     Assessment / Plan / Recommendation Clinical Impression  Pt had a right parietal stroke s/p femur fixation. Stroke diagnosed today on MRI imaging. Pt was alert and cooperative for evaluation. Pt's grandaughter was present as well to help confirm historical information given by patient. Pt self reported difficulty with swallowing medication this morning. "It took her several swallows for it to go down." Pt also reported a soar throat likely due to intubation during surgery, however improving. She was presented with thin liquids, applesauce and cracker. Pt had no difficulties with mastication or A-P transit. She presented with no over s/s of aspiration. Recommend pt to continue regular, thin liquid diet. Crushing patients medications in applesauce as an option if she continues to have difficulty swallowing meds whole. No follow up therapy is recommended.  SLP Visit Diagnosis: Dysphagia, unspecified (R13.10)   Aspiration Risk  No limitations    Diet Recommendation Regular;Thin liquid   Liquid Administration via: Cup;Straw Medication Administration: Whole meds with liquid Postural Changes: Seated upright at 90 degrees    Other  Recommendations Oral Care Recommendations: Oral care BID   Follow up Recommendations None             Prognosis Prognosis for Safe Diet Advancement: Good      Swallow Study   General Date of Onset: 03/27/16 HPI: 83 year old Caucasian female with a past medical history of coronary artery disease status post stenting, hypertension, admitted with fall, left hip fx, s/p hemiarthroplasty 3/21; MRI 3/22 acute right parietal deep white matter CVA.   Type of Study: Bedside Swallow Evaluation Diet Prior to this Study: Regular;Thin liquids Temperature Spikes Noted: No Respiratory Status: Room air History of Recent Intubation: Yes Length of Intubations (days): 1 days Date extubated: 03/27/16 Behavior/Cognition: Alert;Cooperative;Pleasant mood Oral Cavity Assessment: Within Functional Limits Oral Care Completed by SLP: No Oral Cavity - Dentition: Adequate natural dentition Vision: Functional for self-feeding Self-Feeding Abilities: Needs assist;Needs set up Patient Positioning: Upright in bed Baseline Vocal Quality: Normal Volitional Cough: Weak Volitional Swallow: Able to elicit    Oral/Motor/Sensory Function Overall Oral Motor/Sensory Function: Mild impairment Facial ROM:  (slight droop of left face during smile only) Facial Symmetry: Abnormal symmetry left (slight droop during smile) Facial Strength: Within Functional Limits Facial Sensation: Within Functional Limits Lingual ROM: Within Functional Limits Lingual Symmetry: Within Functional Limits Lingual Strength: Within Functional Limits Lingual Sensation: Within Functional Limits   Ice Chips Ice chips: Not tested   Thin Liquid Thin Liquid: Within functional limits Presentation: Cup;Straw  Nectar  Thick Nectar Thick Liquid: Not tested   Honey Thick Honey Thick Liquid: Not tested   Puree Puree: Within functional limits Presentation: Spoon   Solid   GO   Solid: Within functional limits Presentation: Self Fed       Jessica HoseSarah J. Berkeley Vanaken, MA, CCC-SLP 03/28/2016 3:53 PM

## 2016-03-28 NOTE — Progress Notes (Addendum)
[facility] PROGRESS NOTE  Nike Southers ZOX:[id number] DOB: 1922-12-25 DOA: 03/27/2016  PCP: Vivien Presto, MD  Brief History/Interval Summary: 83 year old Caucasian female with a past medical history of coronary artery disease status post stenting, hypertension, presented after a fall at home. This resulted in left hip fracture. Patient apparently had been feeling dizzy on and off and had a syncopal episode resulting in the fall.  Reason for Visit: Left hip fracture. Syncope. Left arm weakness.  Consultants: Orthopedics  Procedures: Left hip hemiarthroplasty 3/21  Antibiotics: None  Subjective/Interval History: Patient denies any significant pain in the left hip. Then she happened to mention that she was concerned about her left wrist and thought that it was broken. Upon further questioning, she mentioned that she has not been able to move her left arm since yesterday. Denies any chest pain, shortness of breath. Does complain of a sensation of room spinning around her this morning.   ROS: Denies any nausea, vomiting.  Objective:  Vital Signs  Vitals:   03/27/16 2010 03/27/16 2129 03/28/16 0019 03/28/16 0431  BP: 123/66 123/66 123/64 126/62  Pulse: 75 70 72 84  Resp: 18 18 18 18   Temp: 97.8 F (36.6 C) 98.1 F (36.7 C) 98.2 F (36.8 C) 98.3 F (36.8 C)  TempSrc: Oral Oral Oral Oral  SpO2: 98% 97% 97% 100%  Weight:      Height:        Intake/Output Summary (Last 24 hours) at 03/28/16 8119 Last data filed at 03/28/16 1478  Gross per 24 hour  Intake           865.83 ml  Output             2025 ml  Net         -1159.17 ml   Filed Weights   03/27/16 0131  Weight: 49.9 kg (110 lb)    General appearance: alert, cooperative, appears stated age and no distress Head: Normocephalic, without obvious abnormality, atraumatic Resp: clear to auscultation bilaterally Cardio: regular rate and rhythm, S1, S2 normal, no murmur, click, rub or gallop GI: soft,  non-tender; bowel sounds normal; no masses,  no organomegaly Extremities: No bruising noted over the left thigh. No tenderness with movement of her left wrist. Neurologic: Awake and alert. Oriented to person, place. Cranial nerves II-12 intact. Motor strength 3-4 out of 5 in the left shoulder. 2 out of 5, left elbow. One out of 5 in the left wrist. Biceps reflex is present.  Lab Results:  Data Reviewed: I have personally reviewed following labs and imaging studies  CBC:  Recent Labs Lab 03/27/16 0223 03/27/16 0759 03/28/16 0559  WBC 8.0 10.0 9.9  NEUTROABS 5.4 6.9  --   HGB 13.5 13.8 11.7*  HCT 39.4 40.4 35.7*  MCV 93.4 94.0 96.0  PLT 222 216 187    Basic Metabolic Panel:  Recent Labs Lab 03/27/16 0223 03/27/16 0759 03/28/16 0559  NA 141 142 140  K 3.9 4.4 4.0  CL 106 107 105  CO2 22 22 21*  GLUCOSE 136* 103* 111*  BUN 16 14 14   CREATININE 1.12* 1.07* 1.10*  CALCIUM 9.3 9.3 8.2*    GFR: Estimated Creatinine Clearance: 26.2 mL/min (A) (by C-G formula based on SCr of 1.1 mg/dL (H)).  Liver Function Tests:  Recent Labs Lab 03/27/16 0223 03/27/16 0759  AST 52* 49*  ALT 47 46  ALKPHOS 44 42  BILITOT 0.9 1.1  PROT 6.6 6.8  ALBUMIN 4.2 4.2  Radiology Studies: Dg Chest 1 View  Result Date: 03/27/2016 CLINICAL DATA:  Chronic dizziness. Status post fall onto left side. Concern for chest injury. Initial encounter. EXAM: CHEST 1 VIEW COMPARISON:  Chest radiograph performed 03/21/2014 FINDINGS: The lungs are well-aerated. Scarring is noted at the right lung apex. There is no evidence of pleural effusion or pneumothorax. The cardiomediastinal silhouette is borderline normal in size. No acute osseous abnormalities are seen. IMPRESSION: Scarring at the right lung apex. No acute cardiopulmonary process seen. Electronically Signed   By: Roanna Raider M.D.   On: 03/27/2016 02:48   Ct Head Wo Contrast  Result Date: 03/27/2016 CLINICAL DATA:  Status post fall on left  side in bathroom. Concern for head or cervical spine injury. Initial encounter. EXAM: CT HEAD WITHOUT CONTRAST CT CERVICAL SPINE WITHOUT CONTRAST TECHNIQUE: Multidetector CT imaging of the head and cervical spine was performed following the standard protocol without intravenous contrast. Multiplanar CT image reconstructions of the cervical spine were also generated. COMPARISON:  None. FINDINGS: CT HEAD FINDINGS Brain: No evidence of acute infarction, hemorrhage, hydrocephalus, extra-axial collection or mass lesion/mass effect. Prominence of the ventricles and sulci reflects moderately severe cortical volume loss. Cerebellar atrophy is noted. Scattered periventricular and subcortical matter change likely reflects small vessel ischemic microangiopathy. The brainstem and fourth ventricle are within normal limits. The basal ganglia are unremarkable in appearance. The cerebral hemispheres demonstrate grossly normal Stirewalt-white differentiation. No mass effect or midline shift is seen. Vascular: No hyperdense vessel or unexpected calcification. Skull: There is no evidence of fracture; visualized osseous structures are unremarkable in appearance. Sinuses/Orbits: The orbits are within normal limits. The paranasal sinuses and mastoid air cells are well-aerated. Other: Soft tissue swelling is noted overlying the left parietal calvarium. CT CERVICAL SPINE FINDINGS Alignment: There is mild grade 1 retrolisthesis of C4 on C5 and of C5 on C6, with associated disc space narrowing. Skull base and vertebrae: No acute fracture. No primary bone lesion or focal pathologic process. Soft tissues and spinal canal: No prevertebral fluid or swelling. No visible canal hematoma. Disc levels: Multilevel disc space narrowing along the cervical spine, with scattered anterior and posterior disc osteophyte complexes. Degenerative change is noted about the dens. Upper chest: Scarring is noted at the lung apices, with associated calcification at the  right lung apex. Calcification is noted at the carotid bifurcations bilaterally. A 1.3 cm hypodensity at the left thyroid lobe is likely benign, given its size. Other: No additional soft tissue abnormalities are seen. IMPRESSION: 1. No evidence of traumatic intracranial injury or fracture. 2. No evidence of fracture or subluxation along the cervical spine. 3. Soft tissue swelling overlying the left parietal calvarium. 4. Moderately severe cortical volume loss and scattered small vessel ischemic microangiopathy. 5. Mild degenerative change along the cervical spine. 6. Scarring at the lung apices, with associated calcification at the right lung apex. 7. Calcification at the carotid bifurcations bilaterally. Carotid ultrasound would be helpful for further evaluation, when and as deemed clinically appropriate. Electronically Signed   By: Roanna Raider M.D.   On: 03/27/2016 02:26   Ct Cervical Spine Wo Contrast  Result Date: 03/27/2016 CLINICAL DATA:  Status post fall on left side in bathroom. Concern for head or cervical spine injury. Initial encounter. EXAM: CT HEAD WITHOUT CONTRAST CT CERVICAL SPINE WITHOUT CONTRAST TECHNIQUE: Multidetector CT imaging of the head and cervical spine was performed following the standard protocol without intravenous contrast. Multiplanar CT image reconstructions of the cervical spine were also generated. COMPARISON:  None.  FINDINGS: CT HEAD FINDINGS Brain: No evidence of acute infarction, hemorrhage, hydrocephalus, extra-axial collection or mass lesion/mass effect. Prominence of the ventricles and sulci reflects moderately severe cortical volume loss. Cerebellar atrophy is noted. Scattered periventricular and subcortical matter change likely reflects small vessel ischemic microangiopathy. The brainstem and fourth ventricle are within normal limits. The basal ganglia are unremarkable in appearance. The cerebral hemispheres demonstrate grossly normal Seelman-white differentiation. No  mass effect or midline shift is seen. Vascular: No hyperdense vessel or unexpected calcification. Skull: There is no evidence of fracture; visualized osseous structures are unremarkable in appearance. Sinuses/Orbits: The orbits are within normal limits. The paranasal sinuses and mastoid air cells are well-aerated. Other: Soft tissue swelling is noted overlying the left parietal calvarium. CT CERVICAL SPINE FINDINGS Alignment: There is mild grade 1 retrolisthesis of C4 on C5 and of C5 on C6, with associated disc space narrowing. Skull base and vertebrae: No acute fracture. No primary bone lesion or focal pathologic process. Soft tissues and spinal canal: No prevertebral fluid or swelling. No visible canal hematoma. Disc levels: Multilevel disc space narrowing along the cervical spine, with scattered anterior and posterior disc osteophyte complexes. Degenerative change is noted about the dens. Upper chest: Scarring is noted at the lung apices, with associated calcification at the right lung apex. Calcification is noted at the carotid bifurcations bilaterally. A 1.3 cm hypodensity at the left thyroid lobe is likely benign, given its size. Other: No additional soft tissue abnormalities are seen. IMPRESSION: 1. No evidence of traumatic intracranial injury or fracture. 2. No evidence of fracture or subluxation along the cervical spine. 3. Soft tissue swelling overlying the left parietal calvarium. 4. Moderately severe cortical volume loss and scattered small vessel ischemic microangiopathy. 5. Mild degenerative change along the cervical spine. 6. Scarring at the lung apices, with associated calcification at the right lung apex. 7. Calcification at the carotid bifurcations bilaterally. Carotid ultrasound would be helpful for further evaluation, when and as deemed clinically appropriate. Electronically Signed   By: Roanna RaiderJeffery  Chang M.D.   On: 03/27/2016 02:26   Pelvis Portable  Result Date: 03/27/2016 CLINICAL DATA:  Post  LEFT hip replacement EXAM: PORTABLE PELVIS 1-2 VIEWS COMPARISON:  Portable exam 1222 hours compared to preoperative images of 03/27/2016 FINDINGS: LEFT hip prosthesis in expected position. Bones demineralized. No acute fracture, dislocation, or bone destruction. IM nail with compression screw proximal RIGHT femur post ORIF of an intertrochanteric fracture. IMPRESSION: Interval resection of the LEFT femoral head and placement of a LEFT hip prosthesis. No acute abnormalities. Electronically Signed   By: Ulyses SouthwardMark  Boles M.D.   On: 03/27/2016 13:08   Dg Hip Unilat W Or Wo Pelvis 2-3 Views Left  Result Date: 03/27/2016 CLINICAL DATA:  Status post fall, with left hip pain. Initial encounter. EXAM: DG HIP (WITH OR WITHOUT PELVIS) 2-3V LEFT COMPARISON:  None. FINDINGS: There appears to be a transcervical fracture through the left femoral neck, with superior displacement of the distal femur. The right femoral head remains seated at the acetabulum. Right femoral hardware is grossly unremarkable, though incompletely imaged. Mild degenerative change is noted along the lower lumbar spine. The sacroiliac joints are grossly unremarkable. The visualized bowel gas pattern is within normal limits. IMPRESSION: Apparent transcervical fracture through the left femoral neck, with superior displacement of the distal femur. Electronically Signed   By: Roanna RaiderJeffery  Chang M.D.   On: 03/27/2016 02:49     Medications:  Scheduled: . aspirin EC  325 mg Oral Q breakfast  . feeding  supplement (ENSURE ENLIVE)  237 mL Oral BID BM  . metoprolol  2.5 mg Intravenous Q8H  . tranexamic acid (CYKLOKAPRON) topical -INTRAOP  2,000 mg Topical Once   Continuous: . sodium chloride 50 mL/hr at 03/27/16 2111  . lactated ringers Stopped (03/27/16 1412)   QMV:[id number] **OR** acetaminophen, hydrALAZINE, HYDROcodone-acetaminophen, meclizine, menthol-cetylpyridinium **OR** phenol, metoCLOPramide **OR** metoCLOPramide (REGLAN) injection, morphine  injection, ondansetron **OR** ondansetron (ZOFRAN) IV  Assessment/Plan:  Active Problems:   LEFT Femoral neck Hip fracture (HCC)   Essential hypertension   CAD (coronary artery disease)   Transcervical fracture of femur, left, closed, initial encounter (HCC)    Left arm weakness Unclear when this occurred. However, the findings are concerning for a neurological process. She does complain of some neck pain. CT scan of the head and neck, done at the time of admission did not show any acute findings. Proceed with MRI brain and MRI cervical spine.  Left femoral fracture status post fall Patient is status post surgical intervention and left hip hemiarthroplasty. Seems to be stable. Pain control.   Syncope, likely secondary to vertigo EKG did not show any ischemic changes. Syncope occurred in the setting of "dizziness". This she describes as a sensation of room spinning around her. She has long-standing ear problems. Most likely she had vertigo resulting in a fall. Continue to monitor on telemetry. There could also be an element of orthostatic hypotension. See above. MRA brain pending.  History of essential hypertension According to the patient's granddaughter, patient has high blood pressure and has not been taking her medications and she started taking her medications about 2 weeks ago. This could've dropped her blood pressure resulting in syncope. Continue to hold hydrochlorothiazide. We will also stop the beta blocker considering the left arm weakness and concern for stroke. Stop hydralazine as well.   History of CAD Appears to be stable from a cardiac standpoint. Denies any chest pains.  ADDENDUM MRI brain did show an acute stroke in the right parietal area. This would explain her left arm weakness. Neurology consulted. Proceed with MRA Heard, Carotid Dopplers, echocardiogram. She is already on aspirin. PT, OT. Check HbA1c and lipid panel. Swallow evaluation, although is not to have any  dysphagia. Discussed with the patient's granddaughter.  DVT Prophylaxis: On aspirin per orthopedics   Code Status: DO NOT RESUSCITATE  Family Communication: Discussed with the patient and her granddaughter  Disposition Plan: Management as above. Await MRI.    LOS: 1 day   [facility]  [facility] Pager [phone] 03/28/2016, 8:11 AM  If 7PM-7AM, please contact night-coverage at [email or web address], password [facility]

## 2016-03-28 NOTE — Progress Notes (Addendum)
Patient ID: Lucia Gaskinsorothy R. Russom, female   DOB: [birth year], 83 y.o.   MRN: [id number] Postoperative day 1 left hip hemiarthroplasty. Patient is resting comfortably this morning. Patient is stable for discharge to skilled nursing at this time. Orders are written for weightbearing as tolerated at discharge with Tylenol for pain and aspirin for DVT prophylaxis I will follow-up in the office in 2 weeks  Radiographs show stable left hip hemiarthroplasty.

## 2016-03-28 NOTE — Consult Note (Signed)
Requesting Physician: Dr. Rito Ehrlich    Chief Complaint: stroke  History obtained from:  Chart   HPI:                                                                                                                                         Jessica Booth is an 83 y.o. female with a past medical history of coronary artery disease status post stenting, hypertension, presented after a fall at home. This resulted in left hip fracture. Patient apparently had been feeling dizzy on and off and had a syncopal episode resulting in the fall. Patient went under a left hip hemiarthroplasty yesterday on 03/28/2010. Surgery was noted the patient was unable to lift her left arm since surgery. Right brain was obtained and showed an acute stroke in the right parietal area.  Patient is very hard of hearing and no history was able to be obtained from her.   Date last known well: Date: 03/27/2016 Time last known well: Unable to determine tPA Given: No: Recent surgery   Past Medical History:  Diagnosis Date  . Abnormal liver function test   . Coronary artery disease 2004   stent RCA  . Hyperlipidemia   . Hypertension   . Irritable bowel syndrome     Past Surgical History:  Procedure Laterality Date  . FEMUR FRACTURE SURGERY    . HIP ARTHROPLASTY Left 03/27/2016   Procedure: ARTHROPLASTY BIPOLAR HIP (HEMIARTHROPLASTY);  Surgeon: Nadara Mustard, MD;  Location: [facility] OR;  Service: Orthopedics;  Laterality: Left;    Family History  Problem Relation Age of Onset  . Hypertension Sister    Social History:  reports that she has never smoked. She has never used smokeless tobacco. She reports that she drinks alcohol. Her drug history is not on file.  Allergies:  Allergies  Allergen Reactions  . Penicillins Hives and Rash    Medications:                                                                                                                           Scheduled: . aspirin EC  325 mg Oral Q breakfast   . feeding supplement (ENSURE ENLIVE)  237 mL Oral BID BM  . LORazepam      . LORazepam  0.5 mg Intravenous Once  . tranexamic acid (CYKLOKAPRON) topical -INTRAOP  2,000 mg Topical  Once    ROS:                                                                                                                                       Unable to be obtained due to hearing difficulty    Neurologic Examination:                                                                                                      Blood pressure 123/60, pulse 74, temperature 98.2 F (36.8 C), temperature source Oral, resp. rate 17, height 5\' 4"  (1.626 m), weight 49.9 kg (110 lb), SpO2 99 %.  HEENT-  Normocephalic, no lesions, without obvious abnormality.  Normal external eye and conjunctiva.  Normal TM's bilaterally.  Normal auditory canals and external ears. Normal external nose, mucus membranes and septum.  Normal pharynx. Cardiovascular- S1, S2 normal, pulses palpable throughout   Lungs- chest clear, no wheezing, rales, normal symmetric air entry Abdomen- normal findings: bowel sounds normal Extremities- no edema Lymph-no adenopathy palpable Musculoskeletal-no joint tenderness, deformity or swelling Skin-warm and dry, no hyperpigmentation, vitiligo, or suspicious lesions  Neurological Examination Mental Status: Alert, oriented,.  Speech fluent without evidence of aphasia.  Able to follow 3 step commands with difficulty due to her hearing difficulty Cranial Nerves: II:  Visual fields grossly normal,  III,IV, VI: ptosis not present, extra-ocular motions intact bilaterally, pupils equal, round, reactive to light and accommodation V,VII: smile symmetric with left NL fold decrease, facial light touch sensation normal bilaterally VIII: hearing very impaired IX,X: uvula rises symmetrically XI: bilateral shoulder shrug XII: midline tongue extension Motor: Right : Upper extremity   5/5    Left:     Upper extremity   --see note  --in abdication pillow due to surgery --left arm has 0/5 strength in wrist, hand, triceps, 4/5 strength in triceps and 4/5 strength in abduction.  Tone and bulk:normal tone throughout; no atrophy noted Sensory: Pinprick and light touch intact throughout, bilaterally--neglecting left side Deep Tendon Reflexes: 2+ and symmetric throughout Plantars: Right: downgoing   Left: downgoing Cerebellar: normal finger-to-nose on the right,  Gait: not tested       Lab Results: Basic Metabolic Panel:  Recent Labs Lab 03/27/16 0223 03/27/16 0759 03/28/16 0559  NA 141 142 140  K 3.9 4.4 4.0  CL 106 107 105  CO2 22 22 21*  GLUCOSE 136* 103* 111*  BUN 16 14 14   CREATININE 1.12* 1.07* 1.10*  CALCIUM 9.3 9.3 8.2*  Liver Function Tests:  Recent Labs Lab 03/27/16 0223 03/27/16 0759  AST 52* 49*  ALT 47 46  ALKPHOS 44 42  BILITOT 0.9 1.1  PROT 6.6 6.8  ALBUMIN 4.2 4.2   No results for input(s): LIPASE, AMYLASE in the last 168 hours. No results for input(s): AMMONIA in the last 168 hours.  CBC:  Recent Labs Lab 03/27/16 0223 03/27/16 0759 03/28/16 0559  WBC 8.0 10.0 9.9  NEUTROABS 5.4 6.9  --   HGB 13.5 13.8 11.7*  HCT 39.4 40.4 35.7*  MCV 93.4 94.0 96.0  PLT 222 216 187    Cardiac Enzymes: No results for input(s): CKTOTAL, CKMB, CKMBINDEX, TROPONINI in the last 168 hours.  Lipid Panel: No results for input(s): CHOL, TRIG, HDL, CHOLHDL, VLDL, LDLCALC in the last 168 hours.  CBG: No results for input(s): GLUCAP in the last 168 hours.  Microbiology: Results for orders placed or performed during the hospital encounter of 03/21/14  Urine culture     Status: None   Collection Time: 03/21/14 10:57 PM  Result Value Ref Range Status   Specimen Description URINE, CLEAN CATCH  Final   Special Requests NONE  Final   Colony Count   Final    >=100,000 COLONIES/ML Performed at [facility]    Culture   Final    KLEBSIELLA PNEUMONIAE Performed at  [facility]    Report Status 03/24/2014 FINAL  Final   Organism ID, Bacteria KLEBSIELLA PNEUMONIAE  Final      Susceptibility   Klebsiella pneumoniae - MIC*    AMPICILLIN RESISTANT      CEFAZOLIN <=4 SENSITIVE Sensitive     CEFTRIAXONE <=1 SENSITIVE Sensitive     CIPROFLOXACIN <=0.25 SENSITIVE Sensitive     GENTAMICIN <=1 SENSITIVE Sensitive     LEVOFLOXACIN <=0.12 SENSITIVE Sensitive     NITROFURANTOIN 32 SENSITIVE Sensitive     TOBRAMYCIN <=1 SENSITIVE Sensitive     TRIMETH/SULFA <=20 SENSITIVE Sensitive     PIP/TAZO <=4 SENSITIVE Sensitive     * KLEBSIELLA PNEUMONIAE    Coagulation Studies: No results for input(s): LABPROT, INR in the last 72 hours.  Imaging: Dg Chest 1 View  Result Date: 03/27/2016 CLINICAL DATA:  Chronic dizziness. Status post fall onto left side. Concern for chest injury. Initial encounter. EXAM: CHEST 1 VIEW COMPARISON:  Chest radiograph performed 03/21/2014 FINDINGS: The lungs are well-aerated. Scarring is noted at the right lung apex. There is no evidence of pleural effusion or pneumothorax. The cardiomediastinal silhouette is borderline normal in size. No acute osseous abnormalities are seen. IMPRESSION: Scarring at the right lung apex. No acute cardiopulmonary process seen. Electronically Signed   By: Roanna Raider M.D.   On: 03/27/2016 02:48   Ct Head Wo Contrast  Result Date: 03/27/2016 CLINICAL DATA:  Status post fall on left side in bathroom. Concern for head or cervical spine injury. Initial encounter. EXAM: CT HEAD WITHOUT CONTRAST CT CERVICAL SPINE WITHOUT CONTRAST TECHNIQUE: Multidetector CT imaging of the head and cervical spine was performed following the standard protocol without intravenous contrast. Multiplanar CT image reconstructions of the cervical spine were also generated. COMPARISON:  None. FINDINGS: CT HEAD FINDINGS Brain: No evidence of acute infarction, hemorrhage, hydrocephalus, extra-axial collection or mass lesion/mass  effect. Prominence of the ventricles and sulci reflects moderately severe cortical volume loss. Cerebellar atrophy is noted. Scattered periventricular and subcortical matter change likely reflects small vessel ischemic microangiopathy. The brainstem and fourth ventricle are within normal limits. The basal ganglia are  unremarkable in appearance. The cerebral hemispheres demonstrate grossly normal Pinkham-white differentiation. No mass effect or midline shift is seen. Vascular: No hyperdense vessel or unexpected calcification. Skull: There is no evidence of fracture; visualized osseous structures are unremarkable in appearance. Sinuses/Orbits: The orbits are within normal limits. The paranasal sinuses and mastoid air cells are well-aerated. Other: Soft tissue swelling is noted overlying the left parietal calvarium. CT CERVICAL SPINE FINDINGS Alignment: There is mild grade 1 retrolisthesis of C4 on C5 and of C5 on C6, with associated disc space narrowing. Skull base and vertebrae: No acute fracture. No primary bone lesion or focal pathologic process. Soft tissues and spinal canal: No prevertebral fluid or swelling. No visible canal hematoma. Disc levels: Multilevel disc space narrowing along the cervical spine, with scattered anterior and posterior disc osteophyte complexes. Degenerative change is noted about the dens. Upper chest: Scarring is noted at the lung apices, with associated calcification at the right lung apex. Calcification is noted at the carotid bifurcations bilaterally. A 1.3 cm hypodensity at the left thyroid lobe is likely benign, given its size. Other: No additional soft tissue abnormalities are seen. IMPRESSION: 1. No evidence of traumatic intracranial injury or fracture. 2. No evidence of fracture or subluxation along the cervical spine. 3. Soft tissue swelling overlying the left parietal calvarium. 4. Moderately severe cortical volume loss and scattered small vessel ischemic microangiopathy. 5. Mild  degenerative change along the cervical spine. 6. Scarring at the lung apices, with associated calcification at the right lung apex. 7. Calcification at the carotid bifurcations bilaterally. Carotid ultrasound would be helpful for further evaluation, when and as deemed clinically appropriate. Electronically Signed   By: Roanna Raider M.D.   On: 03/27/2016 02:26   Ct Cervical Spine Wo Contrast  Result Date: 03/27/2016 CLINICAL DATA:  Status post fall on left side in bathroom. Concern for head or cervical spine injury. Initial encounter. EXAM: CT HEAD WITHOUT CONTRAST CT CERVICAL SPINE WITHOUT CONTRAST TECHNIQUE: Multidetector CT imaging of the head and cervical spine was performed following the standard protocol without intravenous contrast. Multiplanar CT image reconstructions of the cervical spine were also generated. COMPARISON:  None. FINDINGS: CT HEAD FINDINGS Brain: No evidence of acute infarction, hemorrhage, hydrocephalus, extra-axial collection or mass lesion/mass effect. Prominence of the ventricles and sulci reflects moderately severe cortical volume loss. Cerebellar atrophy is noted. Scattered periventricular and subcortical matter change likely reflects small vessel ischemic microangiopathy. The brainstem and fourth ventricle are within normal limits. The basal ganglia are unremarkable in appearance. The cerebral hemispheres demonstrate grossly normal Stech-white differentiation. No mass effect or midline shift is seen. Vascular: No hyperdense vessel or unexpected calcification. Skull: There is no evidence of fracture; visualized osseous structures are unremarkable in appearance. Sinuses/Orbits: The orbits are within normal limits. The paranasal sinuses and mastoid air cells are well-aerated. Other: Soft tissue swelling is noted overlying the left parietal calvarium. CT CERVICAL SPINE FINDINGS Alignment: There is mild grade 1 retrolisthesis of C4 on C5 and of C5 on C6, with associated disc space  narrowing. Skull base and vertebrae: No acute fracture. No primary bone lesion or focal pathologic process. Soft tissues and spinal canal: No prevertebral fluid or swelling. No visible canal hematoma. Disc levels: Multilevel disc space narrowing along the cervical spine, with scattered anterior and posterior disc osteophyte complexes. Degenerative change is noted about the dens. Upper chest: Scarring is noted at the lung apices, with associated calcification at the right lung apex. Calcification is noted at the carotid bifurcations bilaterally. A  1.3 cm hypodensity at the left thyroid lobe is likely benign, given its size. Other: No additional soft tissue abnormalities are seen. IMPRESSION: 1. No evidence of traumatic intracranial injury or fracture. 2. No evidence of fracture or subluxation along the cervical spine. 3. Soft tissue swelling overlying the left parietal calvarium. 4. Moderately severe cortical volume loss and scattered small vessel ischemic microangiopathy. 5. Mild degenerative change along the cervical spine. 6. Scarring at the lung apices, with associated calcification at the right lung apex. 7. Calcification at the carotid bifurcations bilaterally. Carotid ultrasound would be helpful for further evaluation, when and as deemed clinically appropriate. Electronically Signed   By: Roanna Raider M.D.   On: 03/27/2016 02:26   Mr Brain Wo Contrast  Result Date: 03/28/2016 CLINICAL DATA:  Acute onset of dizziness with fall and hip fracture. EXAM: MRI HEAD WITHOUT CONTRAST MRI CERVICAL SPINE WITHOUT CONTRAST TECHNIQUE: Multiplanar, multiecho pulse sequences of the brain and surrounding structures, and cervical spine, to include the craniocervical junction and cervicothoracic junction, were obtained without intravenous contrast. COMPARISON:  CT 03/27/2017 FINDINGS: MRI HEAD FINDINGS Brain: 1 x 2 cm acute infarction in the right parietal deep white matter. No other acute infarction. The brainstem and  cerebellum are unremarkable. Cerebral hemispheres elsewhere show generalized atrophy with confluent chronic small vessel disease of the white matter. No mass lesion, hemorrhage, hydrocephalus or extra-axial collection. Vascular: Major vessels at the base of the brain show flow. Skull and upper cervical spine: Negative Sinuses/Orbits: Clear/normal Other: None MRI CERVICAL SPINE FINDINGS Alignment: Normal Vertebrae: No fracture or primary bone lesion. Cord: No cord compression or primary cord lesion. Posterior Fossa, vertebral arteries, paraspinal tissues: Negative Disc levels: Foramen magnum, C1-2 and C2-3:  Normal. C3-4:  Disc bulge.  No stenosis or neural compression. C4-5: Endplate osteophytes and bulging of the disc. Narrowing of the subarachnoid space. AP diameter of the canal measures 8 mm. Bilateral neural foraminal stenosis. C5-6: Endplate osteophytes and bulging of the disc. Effacement of the subarachnoid space. AP diameter of the canal measures 8 mm. Bilateral foraminal stenosis. C6-7: Spondylosis with endplate osteophytes and bulging of the disc. Narrowing the ventral subarachnoid space. Mild foraminal stenosis bilaterally left more than right. C7-T1:  Normal interspace. Upper thoracic region unremarkable. IMPRESSION: MRI head: 1 x 2 cm acute infarction in the right parietal deep white matter. Atrophy and extensive chronic small vessel ischemic changes elsewhere. MRI cervical spine: No acute or traumatic finding. Mid cervical spondylosis. Spinal stenosis at C4-5, C5-6 and C6-7 with effacement of the subarachnoid space but no actual cord compression. Foraminal stenosis bilaterally at those levels that could affect the C5, C6 or C7 nerve roots. Electronically Signed   By: Paulina Fusi M.D.   On: 03/28/2016 12:53   Mr Cervical Spine Wo Contrast  Result Date: 03/28/2016 CLINICAL DATA:  Acute onset of dizziness with fall and hip fracture. EXAM: MRI HEAD WITHOUT CONTRAST MRI CERVICAL SPINE WITHOUT CONTRAST  TECHNIQUE: Multiplanar, multiecho pulse sequences of the brain and surrounding structures, and cervical spine, to include the craniocervical junction and cervicothoracic junction, were obtained without intravenous contrast. COMPARISON:  CT 03/27/2017 FINDINGS: MRI HEAD FINDINGS Brain: 1 x 2 cm acute infarction in the right parietal deep white matter. No other acute infarction. The brainstem and cerebellum are unremarkable. Cerebral hemispheres elsewhere show generalized atrophy with confluent chronic small vessel disease of the white matter. No mass lesion, hemorrhage, hydrocephalus or extra-axial collection. Vascular: Major vessels at the base of the brain show flow. Skull and upper  cervical spine: Negative Sinuses/Orbits: Clear/normal Other: None MRI CERVICAL SPINE FINDINGS Alignment: Normal Vertebrae: No fracture or primary bone lesion. Cord: No cord compression or primary cord lesion. Posterior Fossa, vertebral arteries, paraspinal tissues: Negative Disc levels: Foramen magnum, C1-2 and C2-3:  Normal. C3-4:  Disc bulge.  No stenosis or neural compression. C4-5: Endplate osteophytes and bulging of the disc. Narrowing of the subarachnoid space. AP diameter of the canal measures 8 mm. Bilateral neural foraminal stenosis. C5-6: Endplate osteophytes and bulging of the disc. Effacement of the subarachnoid space. AP diameter of the canal measures 8 mm. Bilateral foraminal stenosis. C6-7: Spondylosis with endplate osteophytes and bulging of the disc. Narrowing the ventral subarachnoid space. Mild foraminal stenosis bilaterally left more than right. C7-T1:  Normal interspace. Upper thoracic region unremarkable. IMPRESSION: MRI head: 1 x 2 cm acute infarction in the right parietal deep white matter. Atrophy and extensive chronic small vessel ischemic changes elsewhere. MRI cervical spine: No acute or traumatic finding. Mid cervical spondylosis. Spinal stenosis at C4-5, C5-6 and C6-7 with effacement of the subarachnoid  space but no actual cord compression. Foraminal stenosis bilaterally at those levels that could affect the C5, C6 or C7 nerve roots. Electronically Signed   By: Paulina Fusi M.D.   On: 03/28/2016 12:53   Pelvis Portable  Result Date: 03/27/2016 CLINICAL DATA:  Post LEFT hip replacement EXAM: PORTABLE PELVIS 1-2 VIEWS COMPARISON:  Portable exam 1222 hours compared to preoperative images of 03/27/2016 FINDINGS: LEFT hip prosthesis in expected position. Bones demineralized. No acute fracture, dislocation, or bone destruction. IM nail with compression screw proximal RIGHT femur post ORIF of an intertrochanteric fracture. IMPRESSION: Interval resection of the LEFT femoral head and placement of a LEFT hip prosthesis. No acute abnormalities. Electronically Signed   By: Ulyses Southward M.D.   On: 03/27/2016 13:08   Dg Hip Unilat W Or Wo Pelvis 2-3 Views Left  Result Date: 03/27/2016 CLINICAL DATA:  Status post fall, with left hip pain. Initial encounter. EXAM: DG HIP (WITH OR WITHOUT PELVIS) 2-3V LEFT COMPARISON:  None. FINDINGS: There appears to be a transcervical fracture through the left femoral neck, with superior displacement of the distal femur. The right femoral head remains seated at the acetabulum. Right femoral hardware is grossly unremarkable, though incompletely imaged. Mild degenerative change is noted along the lower lumbar spine. The sacroiliac joints are grossly unremarkable. The visualized bowel gas pattern is within normal limits. IMPRESSION: Apparent transcervical fracture through the left femoral neck, with superior displacement of the distal femur. Electronically Signed   By: Roanna Raider M.D.   On: 03/27/2016 02:49       Assessment and plan discussed with with attending physician and they are in agreement.    Felicie Morn PA-C [facility] [phone]  03/28/2016, 1:34 PM   Assessment: 83 y.o. female presenting with right parietal stroke that is post surgery. At this time  patient would benefit from full stroke workup.  Stroke Risk Factors - hyperlipidemia and hypertension  Commend: 1. HgbA1c, fasting lipid panel 2. MRI, MRA  of the brain without contrast 3. PT consult, OT consult, Speech consult 4. Echocardiogram 5. Carotid dopplers 6. Prophylactic therapy-Antiplatelet med: Aspirin - dose 325 mg daily 7. Risk factor modification 8. Telemetry monitoring 9. Frequent neuro checks 10 NPO until passes stroke swallow screen 11 please page stroke NP  Or  PA  Or MD from 8am -4 pm  as this patient from this time will be  followed by the stroke.  You can look them up on [email or web address]  Password [email or web address]

## 2016-03-28 NOTE — Evaluation (Addendum)
Physical Therapy Evaluation Patient Details Name: Jessica Booth MRN: [id number] DOB: 1922/01/16 Today's Date: 03/28/2016   History of Present Illness  83 y.o. female with a past medical history of coronary artery disease status post stenting, hypertension, presented after a fall at home. This resulted in left hip fracture. Patient apparently had been feeling dizzy on and off and had a syncopal episode resulting in the fall. Patient went under a left hip hemiarthroplasty yesterday on 03/28/2010. Surgery was noted the patient was unable to lift her left arm since surgery. Right brain was obtained and showed an acute stroke in the right parietal area.  Clinical Impression  Pt is POD 1 following the above procedure and diagnosis. Prior to admission, pt was living alone and was able to fully care for herself. Following recent fall and onset of left sided weakness, pt requires Max to total assist for mobility this session. Pt continues to question what is going on with her as she is unsure of her diagnosis. Pt was having dizziness prior to fall and hospitalization and continues to have increased c/o dizziness which improves sitting EOB and increases again once she is laid supine. Pt may benefit from a screen for possibility of BPVV. Pt will benefit from short term rehab in order to maximize her functional outcomes to have any possibility to return home again. Pt will require continued acute rehab services in order to assist with smooth transition to recommendation listed below.     Follow Up Recommendations SNF;Supervision/Assistance - 24 hour    Equipment Recommendations  None recommended by PT    Recommendations for Other Services       Precautions / Restrictions Precautions Precautions: Posterior Hip Precaution Booklet Issued: No Precaution Comments: verbally educated Required Braces or Orthoses: Other Brace/Splint Other Brace/Splint: Hip abduction pillow in place and maintained throughout  therapy session Restrictions Weight Bearing Restrictions: Yes LLE Weight Bearing: Weight bearing as tolerated      Mobility  Bed Mobility Overal bed mobility: Needs Assistance Bed Mobility: Supine to Sit;Sit to Supine     Supine to sit: Total assist;+2 for physical assistance Sit to supine: Total assist;+2 for physical assistance   General bed mobility comments: come to EOB towards strong side. utilized pad underneath pt. total +2 assist this session  Transfers                 General transfer comment: not attempted this session  Ambulation/Gait                Stairs            Wheelchair Mobility    Modified Rankin (Stroke Patients Only)       Balance Overall balance assessment: History of Falls;Needs assistance Sitting-balance support: Single extremity supported;Feet supported Sitting balance-Leahy Scale: Fair Sitting balance - Comments: sat min guard EOB for about 10 minutes                                     Pertinent Vitals/Pain Pain Assessment: Faces Pain Score: 0-No pain Faces Pain Scale: Hurts whole lot Pain Location: L hip, LUE, neck. increased pain with bed mobility. Pain Descriptors / Indicators: Aching;Grimacing;Guarding;Moaning Pain Intervention(s): Limited activity within patient's tolerance;Monitored during session;Repositioned;Ice applied    Home Living Family/patient expects to be discharged to:: Private residence Living Arrangements: Alone Available Help at Discharge: Family;Available PRN/intermittently Type of Home: House Home Access: Stairs to enter  Entrance Stairs-Rails: Left;Right Entrance Stairs-Number of Steps: 4-5 Home Layout: One level Home Equipment: Walker - 2 wheels;Cane - single point;Bedside commode;Shower seat      Prior Function Level of Independence: Independent with assistive device(s)         Comments: was mod independent, used cane and sometimes walker. washing up and doing own  housework.      Hand Dominance   Dominant Hand: Right    Extremity/Trunk Assessment   Upper Extremity Assessment Upper Extremity Assessment: Defer to OT evaluation LUE Deficits / Details: LUE gross weakness noted. Spasticity vs. pain with L elbow flexion ROM in supine; pt reporting pain with elbow flexion.  LUE: Unable to fully assess due to pain LUE Coordination: decreased fine motor;decreased gross motor    Lower Extremity Assessment Lower Extremity Assessment: LLE deficits/detail LLE Deficits / Details: LLE grossly 2/5, unable to achieve full knee extension. Difficult to completely assess due to pain.  LLE: Unable to fully assess due to pain       Communication   Communication: HOH  Cognition Arousal/Alertness: Awake/alert Behavior During Therapy: Anxious;Flat affect Overall Cognitive Status: No family/caregiver present to determine baseline cognitive functioning                 General Comments: delayed processing, sustained attention level, verbal perseveration. Oriented to place, time, grossly oriented to situation    General Comments      Exercises     Assessment/Plan    PT Assessment Patient needs continued PT services  PT Problem List Decreased strength;Decreased range of motion;Decreased activity tolerance;Decreased balance;Decreased mobility;Decreased knowledge of use of DME;Pain       PT Treatment Interventions DME instruction;Gait training;Therapeutic exercise;Therapeutic activities;Functional mobility training;Balance training    PT Goals (Current goals can be found in the Care Plan section)  Acute Rehab PT Goals Patient Stated Goal: figure out what's wrong with me PT Goal Formulation: With patient Time For Goal Achievement: 04/04/16 Potential to Achieve Goals: Fair    Frequency Min 3X/week   Barriers to discharge        Co-evaluation PT/OT/SLP Co-Evaluation/Treatment: Yes Reason for Co-Treatment: Complexity of the patient's  impairments (multi-system involvement);Necessary to address cognition/behavior during functional activity;For patient/therapist safety;To address functional/ADL transfers PT goals addressed during session: Mobility/safety with mobility OT goals addressed during session: ADL's and self-care       End of Session   Activity Tolerance: Patient limited by pain Patient left: in bed;with call bell/phone within reach;with chair alarm set;with SCD's reapplied Nurse Communication: Mobility status PT Visit Diagnosis: History of falling (Z91.81);Difficulty in walking, not elsewhere classified (R26.2);Pain Pain - Right/Left: Left Pain - part of body: Hip         Time: [id number] PT Time Calculation (min) (ACUTE ONLY): 39 min   Charges:   PT Evaluation $PT Eval Moderate Complexity: 1 Procedure PT Treatments $Therapeutic Activity: 8-22 mins   PT G Codes:         Colin BroachSabra M. Fianna Snowball PT, DPT  [phone]  03/28/2016, 5:29 PM

## 2016-03-28 NOTE — NC FL2 (Signed)
[place] MEDICAID FL2 LEVEL OF CARE SCREENING TOOL     IDENTIFICATION  Patient Name: Jessica Booth Birthdate: February 17, 1922 Sex: female Admission Date (Current Location): 03/27/2016  [facility] and [place] Number:  Producer, television/film/video and Address:  [facility], [address] [place], [place] [address]      Provider Number: [id number]  Attending Physician Name and Address:  Osvaldo Shipper, MD  Relative Name and Phone Number:       Current Level of Care: Hospital Recommended Level of Care: Skilled Nursing Facility Prior Approval Number:    Date Approved/Denied:   PASRR Number:  [id number]   Discharge Plan: SNF    Current Diagnoses: Patient Active Problem List   Diagnosis Date Noted  . LEFT Femoral neck Hip fracture (HCC) 03/27/2016  . Essential hypertension 03/27/2016  . CAD (coronary artery disease) 03/27/2016  . Transcervical fracture of femur, left, closed, initial encounter (HCC)     Orientation RESPIRATION BLADDER Height & Weight     Self, Situation  Normal Continent Weight: 110 lb (49.9 kg) Height:  5\' 4"  (162.6 cm)  BEHAVIORAL SYMPTOMS/MOOD NEUROLOGICAL BOWEL NUTRITION STATUS      Continent  (Please see d/c summary)  AMBULATORY STATUS COMMUNICATION OF NEEDS Skin   Extensive Assist Verbally Surgical wounds (Closed incision left hip,  mepilex dressing)                       Personal Care Assistance Level of Assistance  Bathing, Feeding, Dressing Bathing Assistance: Maximum assistance Feeding assistance: Limited assistance Dressing Assistance: Maximum assistance     Functional Limitations Info  Sight, Hearing, Speech Sight Info: Adequate Hearing Info: Adequate Speech Info: Adequate    SPECIAL CARE FACTORS FREQUENCY  PT (By licensed PT), OT (By licensed OT)     PT Frequency: 5x OT Frequency: 5x            Contractures Contractures Info: Not present    Additional Factors Info  Code Status, Allergies Code  Status Info: DNR Allergies Info: Penicillins           Current Medications (03/28/2016):  This is the current hospital active medication list Current Facility-Administered Medications  Medication Dose Route Frequency Provider Last Rate Last Dose  . 0.9 %  sodium chloride infusion   Intravenous Continuous Nadara Mustard, MD 50 mL/hr at 03/27/16 2111    . acetaminophen (TYLENOL) tablet 650 mg  650 mg Oral Q6H PRN Nadara Mustard, MD       Or  . acetaminophen (TYLENOL) suppository 650 mg  650 mg Rectal Q6H PRN Nadara Mustard, MD      . aspirin EC tablet 325 mg  325 mg Oral Q breakfast Nadara Mustard, MD   325 mg at 03/28/16 0800  . feeding supplement (ENSURE ENLIVE) (ENSURE ENLIVE) liquid 237 mL  237 mL Oral BID BM Osvaldo Shipper, MD      . HYDROcodone-acetaminophen (NORCO/VICODIN) 5-325 MG per tablet 1-2 tablet  1-2 tablet Oral Q6H PRN Nadara Mustard, MD   2 tablet at 03/28/16 0527  . lactated ringers infusion   Intravenous Continuous Dorris Singh, MD   Stopped at 03/27/16 1412  . LORazepam (ATIVAN) 2 MG/ML injection           . LORazepam (ATIVAN) injection 0.5 mg  0.5 mg Intravenous Once Osvaldo Shipper, MD      . LORazepam (ATIVAN) injection 0.5 mg  0.5 mg Intravenous Once PRN Gokul  Rito EhrlichKrishnan, MD      . meclizine (ANTIVERT) tablet 12.5 mg  12.5 mg Oral BID PRN Osvaldo ShipperGokul Krishnan, MD      . menthol-cetylpyridinium (CEPACOL) lozenge 3 mg  1 lozenge Oral PRN Nadara MustardMarcus Duda V, MD       Or  . phenol (CHLORASEPTIC) mouth spray 1 spray  1 spray Mouth/Throat PRN Nadara MustardMarcus Duda V, MD      . metoCLOPramide (REGLAN) tablet 5-10 mg  5-10 mg Oral Q8H PRN Nadara MustardMarcus Duda V, MD       Or  . metoCLOPramide (REGLAN) injection 5-10 mg  5-10 mg Intravenous Q8H PRN Aldean BakerMarcus Duda V, MD      . morphine 2 MG/ML injection 0.5 mg  0.5 mg Intravenous Q2H PRN Eduard ClosArshad N Kakrakandy, MD   0.5 mg at 03/27/16 2249  . ondansetron (ZOFRAN) tablet 4 mg  4 mg Oral Q6H PRN Nadara MustardMarcus Duda V, MD       Or  . ondansetron [facility](ZOFRAN) injection 4 mg  4 mg  Intravenous Q6H PRN Nadara MustardMarcus Duda V, MD      . tranexamic acid (CYKLOKAPRON) 2,000 mg in sodium chloride 0.9 % 50 mL Topical Application  2,000 mg Topical Once Albina BilletHenry Calvin Martensen III, PA-C         Discharge Medications: Please see discharge summary for a list of discharge medications.  Relevant Imaging Results:  Relevant Lab Results:   Additional Information SSN: [id number]  Maree KrabbeBridget A Bibiana Gillean, LCSW

## 2016-03-28 NOTE — Evaluation (Addendum)
Occupational Therapy Evaluation Patient Details Name: Jessica Booth MRN: [id number] DOB: 08-Jul-1922 Today's Date: 03/28/2016    History of Present Illness 83 y.o. female with a past medical history of coronary artery disease status post stenting, hypertension, presented after a fall at home. This resulted in left hip fracture. Patient apparently had been feeling dizzy on and off and had a syncopal episode resulting in the fall. Patient went under a left hip hemiarthroplasty yesterday on 03/28/2010. Surgery was noted the patient was unable to lift her left arm since surgery. Right brain was obtained and showed an acute stroke in the right parietal area.   Clinical Impression   Pt admitted with the above diagnoses and presents with below problem list. Pt will benefit from continued acute OT to address the below listed deficits and maximize independence with basic ADLs prior to d/c to next venue. PTA pt was mod I with ADLs. Pt is currently +2 total assist for bed mobility. Once EOB did sit for about 10 min guard. In addition to being s/p L posterior hip replacement, pt presents with L hemiplegia, impaired cognition and right side gaze preference (vs. Mild L side neglect). She does not immediately attend to left side but does track to the left and attended to left side during bed mobility. She did report LUE pain which increased with PROM elbow flexion. She also endorsed dizziness at rest, improved once sitting EOB, worsened during transition back to supine.      Follow Up Recommendations  SNF    Equipment Recommendations  Other (comment) (defer to next venue)    Recommendations for Other Services       Precautions / Restrictions Precautions Precautions: Posterior Hip Precaution Comments: verbally educated Required Braces or Orthoses:  (hip abduction wedge in place and maintained during session) Restrictions Weight Bearing Restrictions: Yes LLE Weight Bearing: Weight bearing as tolerated       Mobility Bed Mobility Overal bed mobility: Needs Assistance Bed Mobility: Supine to Sit;Sit to Supine     Supine to sit: Total assist;+2 for physical assistance Sit to supine: Total assist;+2 for physical assistance   General bed mobility comments: come to EOB towards strong side. utilized pad underneath pt. total +2 assist this session  Transfers                 General transfer comment: not attempted this session    Balance Overall balance assessment: History of Falls;Needs assistance Sitting-balance support: Single extremity supported;Feet supported Sitting balance-Leahy Scale: Fair Sitting balance - Comments: sat min guard EOB for about 10 minutes                                    ADL Overall ADL's : Needs assistance/impaired Eating/Feeding: Set up;Sitting   Grooming: Moderate assistance;Sitting   Upper Body Bathing: Maximal assistance;Sitting   Lower Body Bathing: Total assistance;Bed level   Upper Body Dressing : Maximal assistance;Sitting   Lower Body Dressing: Total assistance;Bed level                 General ADL Comments: Pt completed supine <>EOB total +2 assist. Sat EOB about 10 minutes min guard level, single extremity support.      Vision Baseline Vision/History: Wears glasses Patient Visual Report: No change from baseline Additional Comments: Right side preference vs. mild left neglect? does track to left side.      Perception Perception Perception Tested?: Yes  Perception Deficits: Inattention/neglect Inattention/Neglect: Impaired- to be further tested in functional context   Praxis      Pertinent Vitals/Pain Pain Assessment: Faces Faces Pain Scale: Hurts whole lot Pain Location: L hip, LUE, neck. increased pain with bed mobility. Pain Descriptors / Indicators: Aching;Grimacing;Moaning;Operative site guarding;Sore Pain Intervention(s): Limited activity within patient's tolerance;Monitored during  session;Repositioned     Hand Dominance     Extremity/Trunk Assessment Upper Extremity Assessment Upper Extremity Assessment: LUE deficits/detail;Difficult to assess due to impaired cognition LUE Deficits / Details: LUE gross weakness noted. Spasticity vs. pain with L elbow flexion ROM in supine; pt reporting pain with elbow flexion.  LUE: Unable to fully assess due to pain LUE Coordination: decreased fine motor;decreased gross motor   Lower Extremity Assessment Lower Extremity Assessment: Defer to PT evaluation       Communication Communication Communication: HOH   Cognition Arousal/Alertness: Awake/alert Behavior During Therapy: Anxious;Flat affect Overall Cognitive Status: No family/caregiver present to determine baseline cognitive functioning                 General Comments: delayed processing, sustained attention level, verbal perseveration. Oriented to place, time, grossly oriented to situation   General Comments       Exercises       Shoulder Instructions      Home Living Family/patient expects to be discharged to:: Private residence Living Arrangements: Alone Available Help at Discharge: Family;Available PRN/intermittently Type of Home: House Home Access: Stairs to enter Entergy CorporationEntrance Stairs-Number of Steps: 4-5 Entrance Stairs-Rails: Left;Right Home Layout: One level     Bathroom Shower/Tub: Tub/shower unit         Home Equipment: Environmental consultantWalker - 2 wheels;Cane - single point;Bedside commode;Shower seat          Prior Functioning/Environment Level of Independence: modified Independent: cane, sometimes walker        Comments: was independent, washing up and doing own housework.         OT Problem List: Decreased strength;Decreased range of motion;Decreased activity tolerance;Impaired balance (sitting and/or standing);Impaired vision/perception;Decreased coordination;Decreased cognition;Decreased safety awareness;Decreased knowledge of use of DME or  AE;Decreased knowledge of precautions;Cardiopulmonary status limiting activity;Impaired sensation;Impaired tone;Impaired UE functional use;Pain      OT Treatment/Interventions: Self-care/ADL training;Therapeutic exercise;Neuromuscular education;DME and/or AE instruction;Therapeutic activities;Cognitive remediation/compensation;Patient/family education;Balance training;Visual/perceptual remediation/compensation    OT Goals(Current goals can be found in the care plan section) Acute Rehab OT Goals Patient Stated Goal: figure out what's wrong with me OT Goal Formulation: With patient Time For Goal Achievement: 04/11/16 Potential to Achieve Goals: Good ADL Goals Pt Will Perform Upper Body Bathing: with mod assist;sitting Pt Will Perform Lower Body Bathing: with max assist;sit to/from stand Pt Will Perform Upper Body Dressing: with mod assist;sitting Pt Will Perform Lower Body Dressing: with max assist;sit to/from stand Pt Will Transfer to Toilet: with +2 assist;stand pivot transfer;bedside commode;with min assist Pt Will Perform Toileting - Clothing Manipulation and hygiene: with mod assist;sit to/from stand Additional ADL Goal #1: Pt will complete bed mobility at min +2 assist level to prepare for OOB ADLs.   OT Frequency: Min 2X/week   Barriers to D/C:            Co-evaluation PT/OT/SLP Co-Evaluation/Treatment: Yes Reason for Co-Treatment: Complexity of the patient's impairments (multi-system involvement);Necessary to address cognition/behavior during functional activity;For patient/therapist safety;To address functional/ADL transfers   OT goals addressed during session: ADL's and self-care      End of Session    Activity Tolerance: Patient limited by pain Patient left: in bed;with  call bell/phone within reach;with bed alarm set  OT Visit Diagnosis: History of falling (Z91.81);Muscle weakness (generalized) (M62.81);Other symptoms and signs involving cognitive function;Other  symptoms and signs involving the nervous system (R29.898);Hemiplegia and hemiparesis;Pain;Other (comment) (dizziness/vertigo) Hemiplegia - Right/Left: Left Pain - Right/Left: Left Pain - part of body: Arm;Hip (neck)                ADL either performed or assessed with clinical judgement  Time: 1355-1434 OT Time Calculation (min): 39 min Charges:  OT General Charges $OT Visit: 1 Procedure OT Evaluation $OT Eval Moderate Complexity: 1 Procedure G-Codes:       Pilar Grammes 03/28/2016, 3:05 PM

## 2016-03-28 NOTE — Clinical Social Work Note (Signed)
Clinical Social Work Assessment  Patient Details  Name: Jessica GaskinsDorothy R. Booth MRN: [id number] Date of Birth: 09-15-1922  Date of referral:  03/28/16               Reason for consult:  Facility Placement                Permission sought to share information with:  Family Supports Permission granted to share information::     Name::     Furniture conservator/restorerDorothy  Agency::     Relationship::  LawyerGrandaughter  Contact Information:     Housing/Transportation Living arrangements for the past 2 months:  Single Family Home Source of Information:  Other (Comment Required) Engineer, building services(Grandaughter) Patient Interpreter Needed:  None Criminal Activity/Legal Involvement Pertinent to Current Situation/Hospitalization:  No - Comment as needed Significant Relationships:  Other Family Members Lives with:  Self Do you feel safe going back to the place where you live?    Need for family participation in patient care:     Care giving concerns:  Pt's granddaughter was requesting that CSW contact her via telephone.  Social Worker assessment / plan:  CSW spoke with Information systems managerGrandaughter via telephone. Pt was previously living at home independently. Pt's daughter explained they have determined pt has had a stroke. Pt's Grandaughter is agreeable to pt going to SNF at d/c. Pt's grandaughter wants her to go to [facility] as pt's grandaughter drives by there every day. Pt's grandaughter has contact [facility] and started the process. CSW will reach out to facility and follow up with grandaughter.  Employment status:  Retired Health and safety inspectornsurance information:  Medicare PT Recommendations:  Skilled Nursing Facility Information / Referral to community resources:  Skilled Nursing Facility  Patient/Family's Response to care:  Pt's grandaughter verbalized understanding of CSW role and expressed appreciation for support. Pt's grandaughter denies any concern regarding pt care at this time.   Patient/Family's Understanding of and Emotional Response to Diagnosis,  Current Treatment, and Prognosis:  Pt's grandaughter understanding and realistic regarding physical limitations. Pt's grandaughter understands the need for SNF placement at d/c. Pt's grandaughter agreeable to SNF placement at d/c, at this time. Pt's grandaughter denies any concern regarding treatment plan at this time. CSW will continue to provide support and facilitate d/c needs.   Emotional Assessment Appearance:  Appears stated age Attitude/Demeanor/Rapport:  Unable to Assess Affect (typically observed):  Unable to Assess Orientation:  Oriented to Self, Oriented to Place Alcohol / Substance use:  Not Applicable Psych involvement (Current and /or in the community):  No (Comment)  Discharge Needs  Concerns to be addressed:  Care Coordination Readmission within the last 30 days:  No Current discharge risk:  Dependent with Mobility Barriers to Discharge:  Continued Medical Work up   Pacific MutualBridget A Rodricus Candelaria, LCSW 03/28/2016, 2:25 PM

## 2016-03-29 ENCOUNTER — Encounter (HOSPITAL_COMMUNITY): Payer: Medicare Other

## 2016-03-29 ENCOUNTER — Other Ambulatory Visit (HOSPITAL_COMMUNITY): Payer: Medicare Other

## 2016-03-29 LAB — CBC
HEMATOCRIT: 32.5 % — AB (ref 36.0–46.0)
Hemoglobin: 10.8 g/dL — ABNORMAL LOW (ref 12.0–15.0)
MCH: 31.8 pg (ref 26.0–34.0)
MCHC: 33.2 g/dL (ref 30.0–36.0)
MCV: 95.6 fL (ref 78.0–100.0)
Platelets: 174 10*3/uL (ref 150–400)
RBC: 3.4 MIL/uL — AB (ref 3.87–5.11)
RDW: 12.7 % (ref 11.5–15.5)
WBC: 11.5 10*3/uL — AB (ref 4.0–10.5)

## 2016-03-29 LAB — BASIC METABOLIC PANEL
ANION GAP: 8 (ref 5–15)
BUN: 18 mg/dL (ref 6–20)
CHLORIDE: 105 mmol/L (ref 101–111)
CO2: 23 mmol/L (ref 22–32)
Calcium: 8.6 mg/dL — ABNORMAL LOW (ref 8.9–10.3)
Creatinine, Ser: 1.07 mg/dL — ABNORMAL HIGH (ref 0.44–1.00)
GFR calc Af Amer: 51 mL/min — ABNORMAL LOW (ref >60)
GFR, EST NON AFRICAN AMERICAN: 44 mL/min — AB (ref >60)
GLUCOSE: 128 mg/dL — AB (ref 65–99)
POTASSIUM: 3.9 mmol/L (ref 3.5–5.1)
Sodium: 136 mmol/L (ref 135–145)

## 2016-03-29 LAB — LIPID PANEL
CHOL/HDL RATIO: 4 ratio
Cholesterol: 147 mg/dL (ref 0–200)
HDL: 37 mg/dL — AB (ref >40)
LDL CALC: 86 mg/dL (ref 0–99)
Triglycerides: 122 mg/dL (ref <150)
VLDL: 24 mg/dL (ref 0–40)

## 2016-03-29 MED ORDER — ATORVASTATIN CALCIUM 10 MG PO TABS
10.0000 mg | ORAL_TABLET | Freq: Every day | ORAL | Status: DC
Start: 2016-03-29 — End: 2016-04-01
  Administered 2016-03-29 – 2016-03-31 (×3): 10 mg via ORAL
  Filled 2016-03-29 (×3): qty 1

## 2016-03-29 NOTE — Progress Notes (Signed)
[name], female   DOB: 07/21/1922, 83 y.o.   MRN: [id number] Patient resting comfortably. Stroke workup underway and placement for skilled nursing underway.

## 2016-03-29 NOTE — Progress Notes (Signed)
STROKE TEAM PROGRESS NOTE   SUBJECTIVE (INTERVAL HISTORY) Her granddaughter and greatgranddaughter are the bedside.  Granddaughter states LUE weakness was present before her hip surgery. Patient states "I just went down". States she felt bad all day and in the bed the day prior. Got up and went to the fridge, states "I just blacked out", yet states she knew she was falling and could not stop herself, came to right away. Knew she had broken her hip. She noticed her arm was weak in the ED.    OBJECTIVE Temp:  [98.2 F (36.8 C)-99 F (37.2 C)] 99 F (37.2 C) (03/23 0422) Pulse Rate:  [74-95] 88 (03/23 0422) Resp:  [16-18] 16 (03/23 0422) BP: (123-133)/(60-67) 128/62 (03/23 0422) SpO2:  [94 %-100 %] 94 % (03/23 0422)  CBC:  Recent Labs Lab 03/27/16 0223 03/27/16 0759 03/28/16 0559 03/29/16 0242  WBC 8.0 10.0 9.9 11.5*  NEUTROABS 5.4 6.9  --   --   HGB 13.5 13.8 11.7* 10.8*  HCT 39.4 40.4 35.7* 32.5*  MCV 93.4 94.0 96.0 95.6  PLT 222 216 187 174    Basic Metabolic Panel:  Recent Labs Lab 03/28/16 0559 03/29/16 0242  NA 140 136  K 4.0 3.9  CL 105 105  CO2 21* 23  GLUCOSE 111* 128*  BUN 14 18  CREATININE 1.10* 1.07*  CALCIUM 8.2* 8.6*    HgbA1c: No results found for: HGBA1C   PHYSICAL EXAM Frail elderly lady currently not in distress. . Afebrile. Head is nontraumatic. Neck is supple without bruit.    Cardiac exam no murmur or gallop. Lungs are clear to auscultation. Distal pulses are well felt.  Neurological Exam : Awake alert oriented 3 with normal speech and language for admission. Follows commands well. Extraocular moments are full range without nystagmus face is symmetric tongue is midline. Motor system exam reveals left upper extremity plegia with 2/5 strength at the shoulder and elbows. 0/5 strength in the hand and wrist and fingers. Good strength in the right lower extremity and unable to test in the left lower extremity due to hip surgery. Deep tendon reflexes  are symmetric except absent in the left approximately. Left plantar is equivocal right is downgoing. Sensation is preserved bilaterally. Gait was not tested. ASSESSMENT/PLAN [name] is a 83 y.o. female with history of coronary artery disease status post stenting, hypertension, presented after a fall at home with L Hip fix s/p L hips hemiarthroplasty 03/27/16. Per hx, pt with LUE hemiparesis that worsened post op. She was not considered for IV t-PA due to recent surgery.   Stroke:  right parietal infarct secondary to small vessel disease unclear if   occurred prior to fall with worsening during OR with hypotensive episodes vs initial onset during OR with hypotensive episodes   MRI head  R parietal deep white matter infarct. Atrophy. small vessel disease.   MRI cervical spine  No acute finding. Mid cervical spondylosis and spinal stenosis w/o cord compression.  MRA head no LVO. Moderate atherosclerosis in B PCA (motion degraded exam)  Carotid Doppler  pending   2D Echo  pending   LDL 86  HgbA1c pending  SCDs for VTE prophylaxis  Diet regular Room service appropriate? Yes; Fluid consistency: Thin  aspirin 81 mg daily prior to admission, now on aspirin 325 mg daily. Given stroke on aspirin, recommend change to plavix 75 mg daily. If started on anticoagulation post op, that will be adequate for secondary stroke prevention. Once discontinued, start plavix  75 mg daily.  Therapy recommendations:  SNF  Disposition:  pending   Hypertension  Hypotensive episodes w/ SBP < 70 during OR  Stable now  Permissive hypertension (OK if < 220/120) but gradually normalize in 5-7 days  Long-term BP goal normotensive  Hyperlipidemia  Home meds:  No statin  LDL 86 above goal  Add lipitor  Continue statin at discharge  Other Stroke Risk Factors  Advanced age  ETOH use  UDS not performed  Coronary artery disease  Other Active Problems  L femoral fx s/p fall with L hip  surgery  Syncope, ? Vertigo vs stroke  Hospital day # 2  Jessica MoodyBIBY,Jessica Booth  [facility] See Amion for Pager information 03/29/2016 11:49 AM  I have personally examined this patient, reviewed notes, independently viewed imaging studies, participated in medical decision making and plan of care.ROS completed by me personally and pertinent positives fully documented  I have made any additions or clarifications directly to the above note. Agree with note above. Patient developed left upper extremity weakness in the perioperative. Unclear whether weakness actually began before the procedure after the procedure. Etiology of stroke is small vessel disease possibly worsened by intraoperative hypotension. Recommend aspirin for stroke prevention and this patient needs anticoagulation for hip surgery. Physical occupational therapy and rehabilitation consult. Long discussion with the patient and granddaughter and answered questions. Greater than 50% time during this 35 minute visit was spent on counseling and coordination of care about her lacunar stroke, treatment and prevention plan and answering questions  Jessica HeadyPramod Sethi, MD Medical Director [facility] Pager: [phone] 03/29/2016 3:58 PM  To contact Stroke Continuity provider, please refer to [email or web address]. After hours, contact General Neurology

## 2016-03-29 NOTE — Progress Notes (Signed)
Patient is very sad and tearful. When ask what is the matter she states that she does not know and states that she does not feel right. Nurse will notify MD and continue to monitor

## 2016-03-29 NOTE — Progress Notes (Addendum)
qPhysical Therapy Treatment Patient Details Name: Jessica GaskinsDorothy R. Booth MRN: [id number] DOB: Oct 16, 1922 Today's Date: 03/29/2016    History of Present Illness 83 y.o. female with a past medical history of coronary artery disease status post stenting, hypertension, presented after a fall at home. This resulted in left hip fracture. Patient apparently had been feeling dizzy on and off and had a syncopal episode resulting in the fall. Patient went under a left hip hemiarthroplasty yesterday on 03/28/2010. Surgery was noted the patient was unable to lift her left arm since surgery. Right brain was obtained and showed an acute stroke in the right parietal area.    PT Comments    Pt admitted with above diagnosis. Pt currently with functional limitations due to balance and endurance deficits listed below (see PT Problem List). Pt still need max assist of 2 for most mobility due to left hemibody weakness and right LE buckling with weight bearing as well as pain in left LE. Pt needed cues for hip precautions as she did not recall any of them.  Will need extensive rehab.  Pt will benefit from skilled PT to increase their independence and safety with mobility to allow discharge to the venue listed below.     Follow Up Recommendations  SNF;Supervision/Assistance - 24 hour     Equipment Recommendations  None recommended by PT    Recommendations for Other Services       Precautions / Restrictions Precautions Precautions: Posterior Hip Precaution Booklet Issued: No Precaution Comments: verbally re- educated Required Braces or Orthoses: Other Brace/Splint Other Brace/Splint: Pillow in place for hip precautions and maintained after session Restrictions Weight Bearing Restrictions: Yes LLE Weight Bearing: Weight bearing as tolerated    Mobility  Bed Mobility Overal bed mobility: Needs Assistance Bed Mobility: Supine to Sit;Sit to Supine     Supine to sit: +2 for physical assistance;Max assist Sit to  supine: +2 for physical assistance;Max assist   General bed mobility comments: come to EOB towards strong side. utilized pad underneath pt. max +2 assist this session  Transfers Overall transfer level: Needs assistance Equipment used: Rolling walker (2 wheeled);1 person hand held assist Transfers: Sit to/from [employer];Stand Pivot Transfers Sit to Stand: Mod assist;+2 physical assistance;From elevated surface Stand pivot transfers: Max assist;+2 physical assistance;From elevated surface       General transfer comment: Pt was able to stand with assist and cues to power up.  Attempted to stand to RW however pt unable to grip RW with left UE even with assist.  Also, right knee buckling.  Granddaughter stated that her right LE is not her best as it was broken years ago.  Pt also was not putting much weight on left LE due to pain.  Sat back down and then assisted to stand with 1 UE support on her right side and pivoted to chair with right knee buckling needing assist and assist to move left LE.  Poor postural stability as well with pt flexing at trunk due to poor trunk stability as well.   Ambulation/Gait                 Stairs            Wheelchair Mobility    Modified Rankin (Stroke Patients Only)       Balance Overall balance assessment: History of Falls;Needs assistance Sitting-balance support: Single extremity supported;Feet supported Sitting balance-Leahy Scale: Fair Sitting balance - Comments: sat min guard EOB for about 10 minutes   Standing balance support:  Single extremity supported;During functional activity Standing balance-Leahy Scale: Zero Standing balance comment: Needs max assist of 2 to stand EOB with single UE support with right knee instability and poor trunk stability.                             Cognition Arousal/Alertness: Awake/alert Behavior During Therapy: Anxious;Flat affect Overall Cognitive Status: Impaired/Different from baseline                                  General Comments: delayed processing, sustained attention level, verbal perseveration. Oriented to place, time, grossly oriented to situation      Exercises General Exercises - Lower Extremity Ankle Circles/Pumps: AROM;Both;10 reps;Supine Quad Sets: AROM;Both;15 reps;Supine Heel Slides: AROM;Both;10 reps;Supine    General Comments  Assessed BPPV all canals but negative testing.  Also gaze stability difficult to assess because pt slow to respond to cuing.  Pt also had antivert.  Will continue to assess as able.       Pertinent Vitals/Pain Pain Assessment: Faces Faces Pain Scale: Hurts whole lot Pain Location: L hip, LUE, neck. increased pain with bed mobility. Pain Descriptors / Indicators: Aching;Grimacing;Guarding;Moaning Pain Intervention(s): Limited activity within patient's tolerance;Monitored during session;Premedicated before session;Repositioned  O2 >90% on O2  Home Living                      Prior Function            PT Goals (current goals can now be found in the care plan section) Progress towards PT goals: Progressing toward goals    Frequency    Min 3X/week      PT Plan Current plan remains appropriate    Co-evaluation             End of Session Equipment Utilized During Treatment: Gait belt;Oxygen Activity Tolerance: Patient limited by pain;Patient limited by fatigue Patient left: with call bell/phone within reach;with chair alarm set;in chair;with family/visitor present Nurse Communication: Mobility status;Need for lift equipment PT Visit Diagnosis: History of falling (Z91.81);Difficulty in walking, not elsewhere classified (R26.2);Pain Pain - Right/Left: Left Pain - part of body: Hip     Time: 1610-9604 PT Time Calculation (min) (ACUTE ONLY): 27 min  Charges:  $Therapeutic Exercise: 8-22 mins $Therapeutic Activity: 8-22 mins                    G Codes:          Berline Lopes 04/13/16, 2:49 PM [employer] Acute [facility] [phone] (pager)

## 2016-03-29 NOTE — Progress Notes (Addendum)
[facility] PROGRESS NOTE  Jessica Booth ZOX:[id number] DOB: Sep 12, 1922 DOA: 03/27/2016  PCP: Vivien Presto, MD  Brief History/Interval Summary: 83 year old Caucasian female with a past medical history of coronary artery disease status post stenting, hypertension, presented after a fall at home. This resulted in left hip fracture. Patient apparently had been feeling dizzy on and off and had a syncopal episode resulting in the fall. Patient underwent surgery for her hip fracture. Subsequently, she was having left arm weakness. Workup revealed acute stroke. Neurology was consulted.  Reason for Visit: Left hip fracture. Syncope. Left arm weakness.  Consultants: Orthopedics. Neurology  Procedures:  Left hip hemiarthroplasty 3/21  Carotid Doppler is pending  Echocardiogram is pending  Antibiotics: None  Subjective/Interval History: Patient feels better this morning. States that she is able to move her left arm more than yesterday. Denies any pain. Pain in the left hip is also much better. Overall, she feels improved.   ROS: Denies any nausea, vomiting.  Objective:  Vital Signs  Vitals:   03/28/16 0431 03/28/16 1331 03/28/16 2152 03/29/16 0422  BP: 126/62 123/60 133/67 128/62  Pulse: 84 74 95 88  Resp: 18 17 18 16   Temp: 98.3 F (36.8 C) 98.2 F (36.8 C) 98.2 F (36.8 C) 99 F (37.2 C)  TempSrc: Oral Oral Oral Oral  SpO2: 100% 99% 100% 94%  Weight:      Height:        Intake/Output Summary (Last 24 hours) at 03/29/16 0734 Last data filed at 03/28/16 2300  Gross per 24 hour  Intake              360 ml  Output              100 ml  Net              260 ml   Filed Weights   03/27/16 0131  Weight: 49.9 kg (110 lb)    General appearance: alert, cooperative, appears stated age and no distress Resp: clear to auscultation bilaterally Cardio: regular rate and rhythm, S1, S2 normal, no murmur, click, rub or gallop GI: soft, non-tender; bowel sounds normal;  no masses,  no organomegaly Extremities: No bruising noted over the left thigh. No tenderness with movement of her left wrist. Neurologic: Awake and alert. Oriented to person, place. Cranial nerves II-12 intact. Motor strength has improved in the left arm. 4-5 out of 5 in the left shoulder. 4 out of 5 in the left elbow. Still about 1 out of 5 in the left wrist.   Lab Results:  Data Reviewed: I have personally reviewed following labs and imaging studies  CBC:  Recent Labs Lab 03/27/16 0223 03/27/16 0759 03/28/16 0559 03/29/16 0242  WBC 8.0 10.0 9.9 11.5*  NEUTROABS 5.4 6.9  --   --   HGB 13.5 13.8 11.7* 10.8*  HCT 39.4 40.4 35.7* 32.5*  MCV 93.4 94.0 96.0 95.6  PLT 222 216 187 174    Basic Metabolic Panel:  Recent Labs Lab 03/27/16 0223 03/27/16 0759 03/28/16 0559 03/29/16 0242  NA 141 142 140 136  K 3.9 4.4 4.0 3.9  CL 106 107 105 105  CO2 22 22 21* 23  GLUCOSE 136* 103* 111* 128*  BUN 16 14 14 18   CREATININE 1.12* 1.07* 1.10* 1.07*  CALCIUM 9.3 9.3 8.2* 8.6*    GFR: Estimated Creatinine Clearance: 27 mL/min (A) (by C-G formula based on SCr of 1.07 mg/dL (H)).  Liver Function Tests:  Recent  Labs Lab 03/27/16 0223 03/27/16 0759  AST 52* 49*  ALT 47 46  ALKPHOS 44 42  BILITOT 0.9 1.1  PROT 6.6 6.8  ALBUMIN 4.2 4.2     Radiology Studies: Mr Shirlee LatchMra Head WUWo Contrast  Result Date: 03/28/2016 CLINICAL DATA:  Initial evaluation for acute stroke. EXAM: MRA HEAD WITHOUT CONTRAST TECHNIQUE: Angiographic images of the Circle of Willis were obtained using MRA technique without intravenous contrast. COMPARISON:  Prior brain MRI from earlier the same day. FINDINGS: ANTERIOR CIRCULATION: Study moderately degraded by motion artifact. Distal cervical segments of the internal carotid arteries are patent with antegrade flow. Petrous, cavernous, and supraclinoid segments patent without flow-limiting stenosis. A1 segments demonstrate atheromatous irregularity without high-grade  stenosis. Anterior cerebral arteries grossly patent to their distal aspects without obvious high-grade stenosis. Multifocal atheromatous irregularity suspected. M1 segments patent with probable multifocal atheromatous irregularity without obvious high-grade stenosis on this motion degraded exam. Distal MCA branches grossly symmetric with multifocal atheromatous irregularity. POSTERIOR CIRCULATION: Dominant left vertebral artery patent to the vertebral basilar junction. Probable short-segment mild to moderate stenosis within the right V4 segment (series 3, image 18). Posterior inferior cerebral arteries patent proximally. Basilar artery widely patent without obvious stenosis on this motion degraded exam. Superior cerebral arteries patent bilaterally. Both of the posterior cerebral arteries primarily supplied via the basilar. Moderate to severe multifocal bilateral P2 stenoses present, and more notable on the right. PCAs are patent to their distal aspects. No aneurysm or vascular malformation. IMPRESSION: 1. Moderately motion degraded study. 2. No proximal or large vessel occlusion. No correctable stenosis identified. 3. Moderate atherosclerotic changes involving the intracranial circulation as above, most notable within the PCAs bilaterally. Electronically Signed   By: Rise MuBenjamin  McClintock M.D.   On: 03/28/2016 18:45   Mr Brain Wo Contrast  Result Date: 03/28/2016 CLINICAL DATA:  Acute onset of dizziness with fall and hip fracture. EXAM: MRI HEAD WITHOUT CONTRAST MRI CERVICAL SPINE WITHOUT CONTRAST TECHNIQUE: Multiplanar, multiecho pulse sequences of the brain and surrounding structures, and cervical spine, to include the craniocervical junction and cervicothoracic junction, were obtained without intravenous contrast. COMPARISON:  CT 03/27/2017 FINDINGS: MRI HEAD FINDINGS Brain: 1 x 2 cm acute infarction in the right parietal deep white matter. No other acute infarction. The brainstem and cerebellum are  unremarkable. Cerebral hemispheres elsewhere show generalized atrophy with confluent chronic small vessel disease of the white matter. No mass lesion, hemorrhage, hydrocephalus or extra-axial collection. Vascular: Major vessels at the base of the brain show flow. Skull and upper cervical spine: Negative Sinuses/Orbits: Clear/normal Other: None MRI CERVICAL SPINE FINDINGS Alignment: Normal Vertebrae: No fracture or primary bone lesion. Cord: No cord compression or primary cord lesion. Posterior Fossa, vertebral arteries, paraspinal tissues: Negative Disc levels: Foramen magnum, C1-2 and C2-3:  Normal. C3-4:  Disc bulge.  No stenosis or neural compression. C4-5: Endplate osteophytes and bulging of the disc. Narrowing of the subarachnoid space. AP diameter of the canal measures 8 mm. Bilateral neural foraminal stenosis. C5-6: Endplate osteophytes and bulging of the disc. Effacement of the subarachnoid space. AP diameter of the canal measures 8 mm. Bilateral foraminal stenosis. C6-7: Spondylosis with endplate osteophytes and bulging of the disc. Narrowing the ventral subarachnoid space. Mild foraminal stenosis bilaterally left more than right. C7-T1:  Normal interspace. Upper thoracic region unremarkable. IMPRESSION: MRI head: 1 x 2 cm acute infarction in the right parietal deep white matter. Atrophy and extensive chronic small vessel ischemic changes elsewhere. MRI cervical spine: No acute or traumatic finding. Mid cervical  spondylosis. Spinal stenosis at C4-5, C5-6 and C6-7 with effacement of the subarachnoid space but no actual cord compression. Foraminal stenosis bilaterally at those levels that could affect the C5, C6 or C7 nerve roots. Electronically Signed   By: Paulina Fusi M.D.   On: 03/28/2016 12:53   Mr Cervical Spine Wo Contrast  Result Date: 03/28/2016 CLINICAL DATA:  Acute onset of dizziness with fall and hip fracture. EXAM: MRI HEAD WITHOUT CONTRAST MRI CERVICAL SPINE WITHOUT CONTRAST TECHNIQUE:  Multiplanar, multiecho pulse sequences of the brain and surrounding structures, and cervical spine, to include the craniocervical junction and cervicothoracic junction, were obtained without intravenous contrast. COMPARISON:  CT 03/27/2017 FINDINGS: MRI HEAD FINDINGS Brain: 1 x 2 cm acute infarction in the right parietal deep white matter. No other acute infarction. The brainstem and cerebellum are unremarkable. Cerebral hemispheres elsewhere show generalized atrophy with confluent chronic small vessel disease of the white matter. No mass lesion, hemorrhage, hydrocephalus or extra-axial collection. Vascular: Major vessels at the base of the brain show flow. Skull and upper cervical spine: Negative Sinuses/Orbits: Clear/normal Other: None MRI CERVICAL SPINE FINDINGS Alignment: Normal Vertebrae: No fracture or primary bone lesion. Cord: No cord compression or primary cord lesion. Posterior Fossa, vertebral arteries, paraspinal tissues: Negative Disc levels: Foramen magnum, C1-2 and C2-3:  Normal. C3-4:  Disc bulge.  No stenosis or neural compression. C4-5: Endplate osteophytes and bulging of the disc. Narrowing of the subarachnoid space. AP diameter of the canal measures 8 mm. Bilateral neural foraminal stenosis. C5-6: Endplate osteophytes and bulging of the disc. Effacement of the subarachnoid space. AP diameter of the canal measures 8 mm. Bilateral foraminal stenosis. C6-7: Spondylosis with endplate osteophytes and bulging of the disc. Narrowing the ventral subarachnoid space. Mild foraminal stenosis bilaterally left more than right. C7-T1:  Normal interspace. Upper thoracic region unremarkable. IMPRESSION: MRI head: 1 x 2 cm acute infarction in the right parietal deep white matter. Atrophy and extensive chronic small vessel ischemic changes elsewhere. MRI cervical spine: No acute or traumatic finding. Mid cervical spondylosis. Spinal stenosis at C4-5, C5-6 and C6-7 with effacement of the subarachnoid space but no  actual cord compression. Foraminal stenosis bilaterally at those levels that could affect the C5, C6 or C7 nerve roots. Electronically Signed   By: Paulina Fusi M.D.   On: 03/28/2016 12:53   Pelvis Portable  Result Date: 03/27/2016 CLINICAL DATA:  Post LEFT hip replacement EXAM: PORTABLE PELVIS 1-2 VIEWS COMPARISON:  Portable exam 1222 hours compared to preoperative images of 03/27/2016 FINDINGS: LEFT hip prosthesis in expected position. Bones demineralized. No acute fracture, dislocation, or bone destruction. IM nail with compression screw proximal RIGHT femur post ORIF of an intertrochanteric fracture. IMPRESSION: Interval resection of the LEFT femoral head and placement of a LEFT hip prosthesis. No acute abnormalities. Electronically Signed   By: Ulyses Southward M.D.   On: 03/27/2016 13:08     Medications:  Scheduled: . aspirin EC  325 mg Oral Q breakfast  . feeding supplement (ENSURE ENLIVE)  237 mL Oral BID BM  . LORazepam  0.5 mg Intravenous Once  . tranexamic acid (CYKLOKAPRON) topical -INTRAOP  2,000 mg Topical Once   Continuous: . sodium chloride 50 mL/hr at 03/27/16 2111  . lactated ringers Stopped (03/27/16 1412)   ZOX:[id number] **OR** acetaminophen, HYDROcodone-acetaminophen, LORazepam, meclizine, menthol-cetylpyridinium **OR** phenol, metoCLOPramide **OR** metoCLOPramide (REGLAN) injection, morphine injection, ondansetron **OR** ondansetron (ZOFRAN) IV  Assessment/Plan:  Active Problems:   LEFT Femoral neck Hip fracture (HCC)   Essential hypertension  CAD (coronary artery disease)   Transcervical fracture of femur, left, closed, initial encounter (HCC)   Acute ischemic stroke (HCC)    Acute stroke with Left arm weakness Unclear when this occurred. Due to unknown time of onset. She was not a candidate for TPA. Patient's MRI did show acute stroke. Neurology is following. Stroke workup was initiated. Echocardiogram, carotid Doppler, HbA1c, lipid panel. LDL is 86. No  significant process noted in the MRI of the cervical spine.   Left femoral fracture status post fall Patient is status post surgical intervention and left hip hemiarthroplasty. Seems to be stable. Pain control.   Syncope, likely secondary to vertigo EKG did not show any ischemic changes. Syncope occurred in the setting of "dizziness". This she describes as a sensation of room spinning around her. She has long-standing ear problems. Most likely she had vertigo resulting in a fall. Continue to monitor on telemetry. Stroke could've contra. As well.  History of essential hypertension According to the patient's granddaughter, patient has high blood pressure and has not been taking her medications and she started taking her medications about 2 weeks ago. This could've dropped her blood pressure resulting in syncope. Continue to hold her antihypertensives due to acute stroke to allow permissive hypertension.   History of CAD Appears to be stable from a cardiac standpoint. Denies any chest pains.  DVT Prophylaxis: On aspirin per orthopedics   Code Status: DO NOT RESUSCITATE  Family Communication: Discussed with the patient and her granddaughter  Disposition Plan: Management as above. Await stroke workup to be completed. Anticipate discharge to skilled nursing facility in the next 24-48 hours.    LOS: 2 days   [facility]  [facility] Pager [phone] 03/29/2016, 7:34 AM  If 7PM-7AM, please contact night-coverage at [email or web address], password [email or web address]

## 2016-03-30 ENCOUNTER — Inpatient Hospital Stay (HOSPITAL_COMMUNITY): Payer: Medicare Other

## 2016-03-30 DIAGNOSIS — I639 Cerebral infarction, unspecified: Secondary | ICD-10-CM

## 2016-03-30 DIAGNOSIS — R29898 Other symptoms and signs involving the musculoskeletal system: Secondary | ICD-10-CM

## 2016-03-30 DIAGNOSIS — Z96649 Presence of unspecified artificial hip joint: Secondary | ICD-10-CM

## 2016-03-30 DIAGNOSIS — I6789 Other cerebrovascular disease: Secondary | ICD-10-CM

## 2016-03-30 LAB — CBC
HEMATOCRIT: 33.8 % — AB (ref 36.0–46.0)
HEMOGLOBIN: 11.3 g/dL — AB (ref 12.0–15.0)
MCH: 31.8 pg (ref 26.0–34.0)
MCHC: 33.4 g/dL (ref 30.0–36.0)
MCV: 95.2 fL (ref 78.0–100.0)
Platelets: 162 10*3/uL (ref 150–400)
RBC: 3.55 MIL/uL — AB (ref 3.87–5.11)
RDW: 12.4 % (ref 11.5–15.5)
WBC: 9.3 10*3/uL (ref 4.0–10.5)

## 2016-03-30 LAB — ECHOCARDIOGRAM COMPLETE
Height: 64 in
Weight: 1760 oz

## 2016-03-30 LAB — BASIC METABOLIC PANEL
ANION GAP: 11 (ref 5–15)
BUN: 20 mg/dL (ref 6–20)
CALCIUM: 8.7 mg/dL — AB (ref 8.9–10.3)
CHLORIDE: 106 mmol/L (ref 101–111)
CO2: 24 mmol/L (ref 22–32)
Creatinine, Ser: 0.98 mg/dL (ref 0.44–1.00)
GFR calc non Af Amer: 49 mL/min — ABNORMAL LOW (ref >60)
GFR, EST AFRICAN AMERICAN: 57 mL/min — AB (ref >60)
Glucose, Bld: 98 mg/dL (ref 65–99)
POTASSIUM: 4 mmol/L (ref 3.5–5.1)
Sodium: 141 mmol/L (ref 135–145)

## 2016-03-30 LAB — HEMOGLOBIN A1C
Hgb A1c MFr Bld: 5.1 % (ref 4.8–5.6)
MEAN PLASMA GLUCOSE: 100 mg/dL

## 2016-03-30 MED ORDER — ENSURE ENLIVE PO LIQD: 237.0000 mL | Freq: Two times a day (BID) | ORAL | 12 refills | Status: AC

## 2016-03-30 MED ORDER — ACETAMINOPHEN 325 MG PO TABS: 650.0000 mg | ORAL_TABLET | Freq: Four times a day (QID) | ORAL | Status: DC | PRN

## 2016-03-30 MED ORDER — CLOPIDOGREL BISULFATE 75 MG PO TABS: 75.0000 mg | ORAL_TABLET | Freq: Every day | ORAL | Status: DC

## 2016-03-30 MED ORDER — MECLIZINE HCL 12.5 MG PO TABS: 12.5000 mg | ORAL_TABLET | Freq: Two times a day (BID) | ORAL | 0 refills | Status: DC | PRN

## 2016-03-30 MED ORDER — ENOXAPARIN SODIUM 30 MG/0.3ML ~~LOC~~ SOLN: 30.0000 mg | SUBCUTANEOUS | Status: DC

## 2016-03-30 MED ORDER — ATORVASTATIN CALCIUM 10 MG PO TABS: 10.0000 mg | ORAL_TABLET | Freq: Every day | ORAL | Status: DC

## 2016-03-30 MED ORDER — CLOPIDOGREL BISULFATE 75 MG PO TABS
75.0000 mg | ORAL_TABLET | Freq: Every day | ORAL | Status: DC
  Administered 2016-03-30 – 2016-04-01 (×3): 75 mg via ORAL
  Filled 2016-03-30 (×2): qty 1

## 2016-03-30 MED ORDER — ENOXAPARIN SODIUM 30 MG/0.3ML ~~LOC~~ SOLN
30.0000 mg | SUBCUTANEOUS | Status: DC
  Administered 2016-03-30 – 2016-04-01 (×3): 30 mg via SUBCUTANEOUS
  Filled 2016-03-30 (×3): qty 0.3

## 2016-03-30 MED ORDER — HYDROCODONE-ACETAMINOPHEN 5-325 MG PO TABS: 1.0000 | ORAL_TABLET | Freq: Four times a day (QID) | ORAL | 0 refills | Status: DC | PRN

## 2016-03-30 NOTE — Progress Notes (Signed)
Clinical Social Worker [facility] Morrie Sheldonshley stated she spoke to the admission coordinator and the facility will be unable to take patient this weekend. Facility stated that no paper work was done for patient because they were unaware patient would discharge this weekend.[facility] CSW remains available for support and discharge needs.  Marrianne MoodAshley Auda Finfrock, MSW,  [employer]WA [phone]

## 2016-03-30 NOTE — Progress Notes (Signed)
Echocardiogram 2D Echocardiogram has been performed.  Marisue Humblelexis N Jessica Booth 03/30/2016, 10:57 AM

## 2016-03-30 NOTE — Progress Notes (Signed)
STROKE TEAM PROGRESS NOTE   SUBJECTIVE (INTERVAL HISTORY) No family is at bedside. She still has left UE weakness. Left hip pain much improved and she can move left leg well. Dicussed about changing from ASA to plavix.     OBJECTIVE Temp:  [98.1 F (36.7 C)-98.7 F (37.1 C)] 98.7 F (37.1 C) (03/24 1500) Pulse Rate:  [73-102] 102 (03/24 1500) Resp:  [17-18] 17 (03/24 1500) BP: (131-140)/(61-67) 140/65 (03/24 1500) SpO2:  [97 %-98 %] 98 % (03/24 1500)  CBC:  Recent Labs Lab 03/27/16 0223 03/27/16 0759  03/29/16 0242 03/30/16 0516  WBC 8.0 10.0  < > 11.5* 9.3  NEUTROABS 5.4 6.9  --   --   --   HGB 13.5 13.8  < > 10.8* 11.3*  HCT 39.4 40.4  < > 32.5* 33.8*  MCV 93.4 94.0  < > 95.6 95.2  PLT 222 216  < > 174 162  < > = values in this interval not displayed.  Basic Metabolic Panel:   Recent Labs Lab 03/29/16 0242 03/30/16 0516  NA 136 141  K 3.9 4.0  CL 105 106  CO2 23 24  GLUCOSE 128* 98  BUN 18 20  CREATININE 1.07* 0.98  CALCIUM 8.6* 8.7*    HgbA1c:  Lab Results  Component Value Date   HGBA1C 5.1 03/29/2016   Ct Head Wo Contrast 03/27/2016 IMPRESSION: 1. No evidence of traumatic intracranial injury or fracture. 2. No evidence of fracture or subluxation along the cervical spine. 3. Soft tissue swelling overlying the left parietal calvarium. 4. Moderately severe cortical volume loss and scattered small vessel ischemic microangiopathy. 5. Mild degenerative change along the cervical spine. 6. Scarring at the lung apices, with associated calcification at the right lung apex. 7. Calcification at the carotid bifurcations bilaterally. Carotid ultrasound would be helpful for further evaluation, when and as deemed clinically appropriate.   Mra Head Wo Contrast 03/28/2016 IMPRESSION: 1. Moderately motion degraded study. 2. No proximal or large vessel occlusion. No correctable stenosis identified. 3. Moderate atherosclerotic changes involving the intracranial circulation as  above, most notable within the PCAs bilaterally.   Mr Brain and c-spine Wo Contrast 03/28/2016 IMPRESSION: MRI head: 1 x 2 cm acute infarction in the right parietal deep white matter. Atrophy and extensive chronic small vessel ischemic changes elsewhere. MRI cervical spine: No acute or traumatic finding. Mid cervical spondylosis. Spinal stenosis at C4-5, C5-6 and C6-7 with effacement of the subarachnoid space but no actual cord compression. Foraminal stenosis bilaterally at those levels that could affect the C5, C6 or C7 nerve roots.  CUS - Bilateral: 1-39% ICA stenosis. Vertebral artery flow is antegrade.  TTE - Left ventricle: The cavity size was normal. There was mild   concentric hypertrophy. Systolic function was vigorous. The   estimated ejection fraction was in the range of 65% to 70%. Wall   motion was normal; there were no regional wall motion   abnormalities. Doppler parameters are consistent with abnormal   left ventricular relaxation (grade 1 diastolic dysfunction).   There was no evidence of elevated ventricular filling pressure by   Doppler parameters. - Aortic valve: Trileaflet; normal thickness leaflets. There was no   regurgitation. - Aortic root: The aortic root was normal in size. - Mitral valve: Calcified annulus. Mildly thickened leaflets .   There was no regurgitation. - Left atrium: The atrium was normal in size. - Right ventricle: The cavity size was normal. Wall thickness was   normal. Systolic function was normal. -  Tricuspid valve: There was trivial regurgitation. - Pulmonary arteries: Systolic pressure was within the normal   range. - Inferior vena cava: The vessel was normal in size. - Pericardium, extracardiac: There was no pericardial effusion. Impressions: - No cardiac source of emboli was indentified.   PHYSICAL EXAM Frail elderly lady currently not in distress. . Afebrile. Head is nontraumatic. Neck is supple without bruit.    Cardiac exam no murmur  or gallop. Lungs are clear to auscultation. Distal pulses are well felt.  Neurological Exam : Awake alert oriented 3 with normal speech and language for admission. Follows commands well. Extraocular moments are full range without nystagmus left facial droop tongue is midline. Motor system exam reveals left upper extremity plegia with 3/5 strength at the shoulder and elbows, 0/5 strength in the hand and wrist and fingers. Good strength in the right lower extremity and unable to test in the left lower extremity due to hip surgery, but DF bilateral symmetrical 5/5. Deep tendon reflexes are symmetric except absent in the left approximately. Left plantar is equivocal right is downgoing. Sensation is preserved bilaterally. Gait was not tested.   ASSESSMENT/PLAN [name] is a 83 y.o. female with history of coronary artery disease status post stenting, hypertension, presented after a fall at home with L Hip fix s/p L hips hemiarthroplasty 03/27/16. Per hx, pt with LUE hemiparesis that worsened post op. She was not considered for IV t-PA due to recent surgery.   Stroke:  right CR small infarct secondary to small vessel disease. Also possible due to hypotensive episode with SBP < 70 during OR  Resultant - left facial droop, left UE weakness  MRI head  R CR small infarct   MRA head no LVO. Moderate atherosclerosis in B PCA (motion degraded exam)  Carotid Doppler - 1-39% ICA plaquing. Vertebral artery flow is antegrade.   2D Echo - EF 65-70%. No cardiac source of emboli was identified.  LDL - 86  HgbA1c - 5.1  SCDs for VTE prophylaxis Diet regular Room service appropriate? Yes; Fluid consistency: Thin Diet - low sodium heart healthy  aspirin 81 mg daily prior to admission, now on aspirin 325 mg daily. Given stroke on aspirin, recommend change to plavix 75 mg daily.  Therapy recommendations:  SNF  Disposition:  pending   Hypertension  Hypotensive episodes w/ SBP < 70 during  OR  Stable now  Permissive hypertension (OK if < 220/120) but gradually normalize in 5-7 days  Long-term BP goal normotensive  Hyperlipidemia  Home meds:  No statin  LDL 86, goal < 70  Add lipitor 10  Continue statin at discharge  Other Stroke Risk Factors  Advanced age  ETOH use, recommended less than one drink per day  Coronary artery disease  Other Active Problems  L femoral fx s/p fall with L hip surgery    Hospital day # 3  Neurology will sign off. Please call with questions. Pt will follow up with Dr. Pearlean Brownie at [facility] in about 6 weeks. Thanks for the consult.  Marvel Plan, MD PhD Stroke Neurology 03/30/2016 4:50 PM   To contact Stroke Continuity provider, please refer to [email or web address]. After hours, contact General Neurology

## 2016-03-30 NOTE — Progress Notes (Signed)
VASCULAR LAB PRELIMINARY  PRELIMINARY  PRELIMINARY  PRELIMINARY  Carotid duplex completed.    Preliminary report:  1-39% ICA plaquing. Vertebral artery flow is antegrade.   Abrea Henle, RVT 03/30/2016, 11:15 AM

## 2016-03-30 NOTE — Discharge Instructions (Signed)
Hip Fracture A hip fracture is a fracture of the upper part of your thigh bone (femur). What are the causes? A hip fracture is caused by a direct blow to the side of your hip. This is usually the result of a fall but can occur in other circumstances, such as an automobile accident. What increases the risk? There is an increased risk of hip fractures in people with:  An unsteady walking pattern (gait) and those with conditions that contribute to poor balance, such as Parkinson's disease or dementia.  Osteopenia and osteoporosis.  Cancer that spreads to the leg bones.  Certain metabolic diseases. What are the signs or symptoms? Symptoms of hip fracture include:  Pain over the injured hip.  Inability to put weight on the leg in which the fracture occurred (although, some patients are able to walk after a hip fracture).  Toes and foot of the affected leg point outward when you lie down. How is this diagnosed? A physical exam can determine if a hip fracture is likely to have occurred. X-ray exams are needed to confirm the fracture and to look for other injuries. The X-ray exam can help to determine the type of hip fracture. Rarely, the fracture is not visible on an X-ray image and a CT scan or MRI will have to be done. How is this treated? The treatment for a fracture is usually surgery. This means using a screw, nail, or rod to hold the bones in place. Follow these instructions at home: Take all medicines as directed by your health care provider. Contact a health care provider if: Pain continues, even after taking pain medicine. This information is not intended to replace advice given to you by your health care provider. Make sure you discuss any questions you have with your health care provider. Document Released: 12/24/2004 Document Revised: 06/01/2015 Document Reviewed: 08/05/2012 Elsevier Interactive Patient Education  2017 [employer].   Ischemic Stroke An ischemic stroke  (cerebrovascular accident, or CVA) is the sudden death of brain tissue that occurs when an area of the brain does not get enough oxygen. It is a medical emergency that must be treated right away. An ischemic stroke can cause permanent loss of brain function. This can cause problems with how different parts of your body function. What are the causes? This condition is caused by a decrease of oxygen supply to an area of the brain, which may be the result of:  A small blood clot (embolus) or a buildup of plaque in the blood vessels (atherosclerosis) that blocks blood flow in the brain.  An abnormal heart rhythm (atrial fibrillation).  A blocked or damaged artery in the head or neck. What increases the risk? Certain factors may make you more likely to develop this condition. Some of these factors are things that you can change, such as:  Obesity.  Smoking cigarettes.  Taking oral birth control, especially if you also use tobacco.  Physical inactivity.  Excessive alcohol use.  Use of illegal drugs, especially cocaine and methamphetamine. Other risk factors include:  High blood pressure (hypertension).  High cholesterol.  Diabetes mellitus.  Heart disease.  Being [place] American, Native [address], Hispanic, or [place] Native.  Being over age 20.  Family history of stroke.  Previous history of blood clots, stroke, or transient ischemic attack (TIA).  Sickle cell disease.  Being a woman with a history of preeclampsia.  Migraine headache.  Sleep apnea.  Irregular heartbeats, such as atrial fibrillation.  Chronic inflammatory diseases, such as  rheumatoid arthritis or lupus.  Blood clotting disorders (hypercoagulable state). What are the signs or symptoms? Symptoms of this condition usually develop suddenly, or you may notice them after waking up from sleep. Symptoms may include sudden:  Weakness or numbness in your face, arm, or leg, especially on one side of your  body.  Trouble walking or difficulty moving your arms or legs.  Loss of balance or coordination.  Confusion.  Slurred speech (dysarthria).  Trouble speaking, understanding speech, or both (aphasia).  Vision changes--such as double vision, blurred vision, or loss of vision--inone or both eyes.  Dizziness.  Nausea and vomiting.  Severe headache with no known cause. The headache is often described as the worst headache ever experienced. If possible, make note of the exact time that you last felt like your normal self and what time your symptoms started. Tell your health care provider. If symptoms come and go, this could be a sign of a warning stroke, or TIA. Get help right away, even if you feel better. How is this diagnosed? This condition may be diagnosed based on:  Your symptoms, your medical history, and a physical exam.  CT scan of the brain.  MRI.  CT angiogram. This test uses a computer to take X-rays of your arteries. A dye may be injected into your blood to show the inside of your blood vessels more clearly.  MRI angiogram. This is a type of MRI that is used to evaluate the blood vessels.  Cerebral angiogram. This test uses X-rays and a dye to show the blood vessels in the brain and neck. You may need to see a health care provider who specializes in stroke care. A stroke specialist can be seen in person or through communication using telephone or television technology (telemedicine). Other tests may also be done to find the cause of the stroke, such as:  Electrocardiogram (ECG).  Continuous heart monitoring.  Echocardiogram.  Carotid ultrasound.  A scan of the brain circulation.  Blood tests.  Sleep study to check for sleep apnea. How is this treated? Treatment for this condition will depend on the duration, severity, and cause of your symptoms and on the area of the brain affected. It is very important to get treatment at the first sign of stroke symptoms.  Some treatments work better if they are done within 3-6 hours of the onset of stroke symptoms. These initial treatments may include:  Aspirin.  Medicines to control blood pressure.  Medicine given by injection to dissolve the blood clot (thrombolytic).  Treatments given directly to the affected artery to remove or dissolve the blood clot. Other treatment options may include:  Oxygen.  IV fluids.  Medicines to thin the blood (anticoagulants or antiplatelets).  Procedures to increase blood flow. Medicines and changes to your diet may be used to help treat and manage risk factors for stroke, such as diabetes, high cholesterol, and high blood pressure. After a stroke, you may work with physical, speech, mental health, or occupational therapists to help you recover. Follow these instructions at home: Medicines   Take over-the-counter and prescription medicines only as told by your health care provider.  If you were told to take a medicine to thin your blood, such as aspirin or an anticoagulant, take it exactly as told by your health care provider.  Taking too much blood-thinning medicine can cause bleeding.  If you do not take enough blood-thinning medicine, you will not have the protection that you need against another stroke and other  problems.  Understand the side effects of taking anticoagulant medicine. When taking this type of medicine, make sure you:  Hold pressure over any cuts for longer than usual.  Tell your dentist and other health care providers that you are taking anticoagulants before you have any procedures that may cause bleeding.  Avoid activities that may cause trauma or injury. Eating and drinking   Follow instructions from your health care provider about diet.  Eat healthy foods.  If your ability to swallow was affected by the stroke, you may need to take steps to avoid choking, such as:  Taking small bites when eating.  Eating foods that are soft or  pureed. Safety   Follow instructions from your health care team about physical activity.  Use a walker or cane as told by your health care provider.  Take steps to create a safe home environment in order to reduce the risk of falls. This may include:  Having your home looked at by specialists.  Installing grab bars in the bedroom and bathroom.  Using safety equipment, such as raised toilets and a seat in the shower. General instructions   Do not use any tobacco products, such as cigarettes, chewing tobacco, and e-cigarettes. If you need help quitting, ask your health care provider.  Limit alcohol intake to no more than 1 drink a day for nonpregnant women and 2 drinks a day for men. One drink equals 12 oz of beer, 5 oz of wine, or 1 oz of hard liquor.  If you need help to stop using drugs or alcohol, ask your health care provider about a referral to a program or specialist.  Maintain an active and healthy lifestyle. Get regular exercise as told by your health care provider.  Keep all follow-up visits as told by your health care provider, including visits with all specialists on your health care team. This is important. How is this prevented? Your risk of another stroke can be decreased by managing high blood pressure, high cholesterol, diabetes, heart disease, sleep apnea, and obesity. It can also be decreased by quitting smoking, limiting alcohol, and staying physically active. Your health care provider will continue to work with you on measures to prevent short-term and long-term complications of stroke. Get help right away if: You have:  Sudden weakness or numbness in your face, arm, or leg, especially on one side of your body.  Sudden confusion.  Sudden trouble speaking, understanding, or both (aphasia).  Sudden trouble seeing with one or both eyes.  Sudden trouble walking or difficulty moving your arms or legs.  Sudden dizziness.  Sudden loss of balance or  coordination.  Sudden, severe headache with no known cause.  A partial or total loss of consciousness.  A seizure. Any of these symptoms may represent a serious problem that is an emergency. Do not wait to see if the symptoms will go away. Get medical help right away. Call your local emergency services (911 in [place] Do not drive yourself to the hospital. This information is not intended to replace advice given to you by your health care provider. Make sure you discuss any questions you have with your health care provider. Document Released: 12/24/2004 Document Revised: 06/06/2015 Document Reviewed: 03/22/2015 Elsevier Interactive Patient Education  2017 [employer]

## 2016-03-30 NOTE — Discharge Summary (Addendum)
[facility]  Physician Discharge Summary   [name] MRN: [id number] DOB/AGE: [birth year] 83 y.o.  Admit date: 03/27/2016 Discharge date: 03/30/2016  PCP: Vivien Presto, MD  DISCHARGE DIAGNOSES:  Active Problems:   LEFT Femoral neck Hip fracture (HCC)   Essential hypertension   CAD (coronary artery disease)   Transcervical fracture of femur, left, closed, initial encounter (HCC)   Acute ischemic stroke (HCC)   RECOMMENDATIONS FOR OUTPATIENT FOLLOW UP: 1. Needs outpatient follow-up with orthopedics and neurology. 2. CBC and basic metabolic panel 1 week   DISCHARGE CONDITION: fair  Diet recommendation: Heart healthy  Filed Weights   03/27/16 0131  Weight: 49.9 kg (110 lb)    INITIAL HISTORY: 83 year old Caucasian female with a past medical history of coronary artery disease status post stenting, hypertension, presented after a fall at home. This resulted in left hip fracture. Patient apparently had been feeling dizzy on and off and had a syncopal episode resulting in the fall. Patient underwent surgery for her hip fracture. Subsequently, she was having left arm weakness. Workup revealed acute stroke. Neurology was consulted.  Consultants: Orthopedics. Neurology  Procedures:  Left hip hemiarthroplasty 3/21  Carotid Doppler Preliminary report:  1-39% ICA plaquing. Vertebral artery flow is antegrade.    Echocardiogram Study Conclusions  - Left ventricle: The cavity size was normal. There was mild   concentric hypertrophy. Systolic function was vigorous. The   estimated ejection fraction was in the range of 65% to 70%. Wall   motion was normal; there were no regional wall motion   abnormalities. Doppler parameters are consistent with abnormal   left ventricular relaxation (grade 1 diastolic dysfunction).   There was no evidence of elevated ventricular filling pressure by   Doppler parameters. - Aortic valve: Trileaflet; normal  thickness leaflets. There was no   regurgitation. - Aortic root: The aortic root was normal in size. - Mitral valve: Calcified annulus. Mildly thickened leaflets .   There was no regurgitation. - Left atrium: The atrium was normal in size. - Right ventricle: The cavity size was normal. Wall thickness was   normal. Systolic function was normal. - Tricuspid valve: There was trivial regurgitation. - Pulmonary arteries: Systolic pressure was within the normal   range. - Inferior vena cava: The vessel was normal in size. - Pericardium, extracardiac: There was no pericardial effusion.  Impressions:  - No cardiac source of emboli was indentified.   HOSPITAL COURSE:   Acute stroke with Left arm weakness Unclear when this occurred. Due to unknown time of onset she was not a candidate for TPA. Patient's MRI did show acute stroke. Neurology was consulted. Stroke workup was initiated. Echocardiogram, carotid Doppler as above., HbA1c is 5.1. LDL is 86. Started on Lipitor. She has had some improvement in the strength in the left upper extremity. Still unable to move her hand or wrist. Will need rehabilitation at skilled nursing facility. No significant process noted in the MRI of the cervical spine. Plavix recommended for secondary stroke prevention. Discussed with her granddaughter.  Left femoral fracture status post fall Patient is status post surgical intervention and left hip hemiarthroplasty. Seems to be stable. Pain control. Due to hip fracture and stroke, patient will have limited mobility. She will be at risk to develop DVT. Will benefit from Lovenox for 2 weeks.  Syncope, likely secondary to vertigo EKG did not show any ischemic changes. Syncope occurred in the setting of "dizziness". This she describes as a sensation of room spinning around her.  She has long-standing ear problems. Most likely she had vertigo resulting in a fall. Continue to monitor on telemetry. Stroke could've  contributing as well. Meclizine as needed.  History of essential hypertension According to the patient's granddaughter, patient has high blood pressure and has not been taking her medications and she started taking her medications about 2 weeks ago. This could've dropped her blood pressure resulting in syncope. Blood pressure medications were held in the hospital. Could be resumed.  History of CAD Appears to be stable from a cardiac standpoint. Denies any chest pains.  Depressed mood. Patient voices frustration that she has multiple medical problems. She seems to be upset at times. Discussed with her granddaughter. Granddaughter asked me if an antidepressant could be prescribed. Have told her that this could be normal reaction to acute illness. She will need to be reassured and if her symptoms persist in a few weeks and if they affect her daily functioning, then medications can be considered. However, at this time, we will not advice to start her on any medication due to potential for side effects in elderly patients.  Overall, stable. Discussed with neurology today. Okay for discharge to skilled nursing facility.  PERTINENT LABS:  The results of significant diagnostics from this hospitalization (including imaging, microbiology, ancillary and laboratory) are listed below for reference.     Labs: Basic Metabolic Panel:  Recent Labs Lab 03/27/16 0223 03/27/16 0759 03/28/16 0559 03/29/16 0242 03/30/16 0516  NA 141 142 140 136 141  K 3.9 4.4 4.0 3.9 4.0  CL 106 107 105 105 106  CO2 22 22 21* 23 24  GLUCOSE 136* 103* 111* 128* 98  BUN 16 14 14 18 20   CREATININE 1.12* 1.07* 1.10* 1.07* 0.98  CALCIUM 9.3 9.3 8.2* 8.6* 8.7*   Liver Function Tests:  Recent Labs Lab 03/27/16 0223 03/27/16 0759  AST 52* 49*  ALT 47 46  ALKPHOS 44 42  BILITOT 0.9 1.1  PROT 6.6 6.8  ALBUMIN 4.2 4.2   CBC:  Recent Labs Lab 03/27/16 0223 03/27/16 0759 03/28/16 0559 03/29/16 0242  03/30/16 0516  WBC 8.0 10.0 9.9 11.5* 9.3  NEUTROABS 5.4 6.9  --   --   --   HGB 13.5 13.8 11.7* 10.8* 11.3*  HCT 39.4 40.4 35.7* 32.5* 33.8*  MCV 93.4 94.0 96.0 95.6 95.2  PLT 222 216 187 174 162     IMAGING STUDIES Dg Chest 1 View  Result Date: 03/27/2016 CLINICAL DATA:  Chronic dizziness. Status post fall onto left side. Concern for chest injury. Initial encounter. EXAM: CHEST 1 VIEW COMPARISON:  Chest radiograph performed 03/21/2014 FINDINGS: The lungs are well-aerated. Scarring is noted at the right lung apex. There is no evidence of pleural effusion or pneumothorax. The cardiomediastinal silhouette is borderline normal in size. No acute osseous abnormalities are seen. IMPRESSION: Scarring at the right lung apex. No acute cardiopulmonary process seen. Electronically Signed   By: Roanna Raider M.D.   On: 03/27/2016 02:48   Ct Head Wo Contrast  Result Date: 03/27/2016 CLINICAL DATA:  Status post fall on left side in bathroom. Concern for head or cervical spine injury. Initial encounter. EXAM: CT HEAD WITHOUT CONTRAST CT CERVICAL SPINE WITHOUT CONTRAST TECHNIQUE: Multidetector CT imaging of the head and cervical spine was performed following the standard protocol without intravenous contrast. Multiplanar CT image reconstructions of the cervical spine were also generated. COMPARISON:  None. FINDINGS: CT HEAD FINDINGS Brain: No evidence of acute infarction, hemorrhage, hydrocephalus, extra-axial collection or mass  lesion/mass effect. Prominence of the ventricles and sulci reflects moderately severe cortical volume loss. Cerebellar atrophy is noted. Scattered periventricular and subcortical matter change likely reflects small vessel ischemic microangiopathy. The brainstem and fourth ventricle are within normal limits. The basal ganglia are unremarkable in appearance. The cerebral hemispheres demonstrate grossly normal Zhang-white differentiation. No mass effect or midline shift is seen. Vascular: No  hyperdense vessel or unexpected calcification. Skull: There is no evidence of fracture; visualized osseous structures are unremarkable in appearance. Sinuses/Orbits: The orbits are within normal limits. The paranasal sinuses and mastoid air cells are well-aerated. Other: Soft tissue swelling is noted overlying the left parietal calvarium. CT CERVICAL SPINE FINDINGS Alignment: There is mild grade 1 retrolisthesis of C4 on C5 and of C5 on C6, with associated disc space narrowing. Skull base and vertebrae: No acute fracture. No primary bone lesion or focal pathologic process. Soft tissues and spinal canal: No prevertebral fluid or swelling. No visible canal hematoma. Disc levels: Multilevel disc space narrowing along the cervical spine, with scattered anterior and posterior disc osteophyte complexes. Degenerative change is noted about the dens. Upper chest: Scarring is noted at the lung apices, with associated calcification at the right lung apex. Calcification is noted at the carotid bifurcations bilaterally. A 1.3 cm hypodensity at the left thyroid lobe is likely benign, given its size. Other: No additional soft tissue abnormalities are seen. IMPRESSION: 1. No evidence of traumatic intracranial injury or fracture. 2. No evidence of fracture or subluxation along the cervical spine. 3. Soft tissue swelling overlying the left parietal calvarium. 4. Moderately severe cortical volume loss and scattered small vessel ischemic microangiopathy. 5. Mild degenerative change along the cervical spine. 6. Scarring at the lung apices, with associated calcification at the right lung apex. 7. Calcification at the carotid bifurcations bilaterally. Carotid ultrasound would be helpful for further evaluation, when and as deemed clinically appropriate. Electronically Signed   By: Roanna Raider M.D.   On: 03/27/2016 02:26   Ct Cervical Spine Wo Contrast  Result Date: 03/27/2016 CLINICAL DATA:  Status post fall on left side in  bathroom. Concern for head or cervical spine injury. Initial encounter. EXAM: CT HEAD WITHOUT CONTRAST CT CERVICAL SPINE WITHOUT CONTRAST TECHNIQUE: Multidetector CT imaging of the head and cervical spine was performed following the standard protocol without intravenous contrast. Multiplanar CT image reconstructions of the cervical spine were also generated. COMPARISON:  None. FINDINGS: CT HEAD FINDINGS Brain: No evidence of acute infarction, hemorrhage, hydrocephalus, extra-axial collection or mass lesion/mass effect. Prominence of the ventricles and sulci reflects moderately severe cortical volume loss. Cerebellar atrophy is noted. Scattered periventricular and subcortical matter change likely reflects small vessel ischemic microangiopathy. The brainstem and fourth ventricle are within normal limits. The basal ganglia are unremarkable in appearance. The cerebral hemispheres demonstrate grossly normal Meegan-white differentiation. No mass effect or midline shift is seen. Vascular: No hyperdense vessel or unexpected calcification. Skull: There is no evidence of fracture; visualized osseous structures are unremarkable in appearance. Sinuses/Orbits: The orbits are within normal limits. The paranasal sinuses and mastoid air cells are well-aerated. Other: Soft tissue swelling is noted overlying the left parietal calvarium. CT CERVICAL SPINE FINDINGS Alignment: There is mild grade 1 retrolisthesis of C4 on C5 and of C5 on C6, with associated disc space narrowing. Skull base and vertebrae: No acute fracture. No primary bone lesion or focal pathologic process. Soft tissues and spinal canal: No prevertebral fluid or swelling. No visible canal hematoma. Disc levels: Multilevel disc space narrowing along the  cervical spine, with scattered anterior and posterior disc osteophyte complexes. Degenerative change is noted about the dens. Upper chest: Scarring is noted at the lung apices, with associated calcification at the right  lung apex. Calcification is noted at the carotid bifurcations bilaterally. A 1.3 cm hypodensity at the left thyroid lobe is likely benign, given its size. Other: No additional soft tissue abnormalities are seen. IMPRESSION: 1. No evidence of traumatic intracranial injury or fracture. 2. No evidence of fracture or subluxation along the cervical spine. 3. Soft tissue swelling overlying the left parietal calvarium. 4. Moderately severe cortical volume loss and scattered small vessel ischemic microangiopathy. 5. Mild degenerative change along the cervical spine. 6. Scarring at the lung apices, with associated calcification at the right lung apex. 7. Calcification at the carotid bifurcations bilaterally. Carotid ultrasound would be helpful for further evaluation, when and as deemed clinically appropriate. Electronically Signed   By: Roanna RaiderJeffery  Chang M.D.   On: 03/27/2016 02:26   Mr Maxine GlennMra Head Wo Contrast  Result Date: 03/28/2016 CLINICAL DATA:  Initial evaluation for acute stroke. EXAM: MRA HEAD WITHOUT CONTRAST TECHNIQUE: Angiographic images of the Circle of Willis were obtained using MRA technique without intravenous contrast. COMPARISON:  Prior brain MRI from earlier the same day. FINDINGS: ANTERIOR CIRCULATION: Study moderately degraded by motion artifact. Distal cervical segments of the internal carotid arteries are patent with antegrade flow. Petrous, cavernous, and supraclinoid segments patent without flow-limiting stenosis. A1 segments demonstrate atheromatous irregularity without high-grade stenosis. Anterior cerebral arteries grossly patent to their distal aspects without obvious high-grade stenosis. Multifocal atheromatous irregularity suspected. M1 segments patent with probable multifocal atheromatous irregularity without obvious high-grade stenosis on this motion degraded exam. Distal MCA branches grossly symmetric with multifocal atheromatous irregularity. POSTERIOR CIRCULATION: Dominant left vertebral  artery patent to the vertebral basilar junction. Probable short-segment mild to moderate stenosis within the right V4 segment (series 3, image 18). Posterior inferior cerebral arteries patent proximally. Basilar artery widely patent without obvious stenosis on this motion degraded exam. Superior cerebral arteries patent bilaterally. Both of the posterior cerebral arteries primarily supplied via the basilar. Moderate to severe multifocal bilateral P2 stenoses present, and more notable on the right. PCAs are patent to their distal aspects. No aneurysm or vascular malformation. IMPRESSION: 1. Moderately motion degraded study. 2. No proximal or large vessel occlusion. No correctable stenosis identified. 3. Moderate atherosclerotic changes involving the intracranial circulation as above, most notable within the PCAs bilaterally. Electronically Signed   By: Rise MuBenjamin  McClintock M.D.   On: 03/28/2016 18:45   Mr Brain Wo Contrast  Result Date: 03/28/2016 CLINICAL DATA:  Acute onset of dizziness with fall and hip fracture. EXAM: MRI HEAD WITHOUT CONTRAST MRI CERVICAL SPINE WITHOUT CONTRAST TECHNIQUE: Multiplanar, multiecho pulse sequences of the brain and surrounding structures, and cervical spine, to include the craniocervical junction and cervicothoracic junction, were obtained without intravenous contrast. COMPARISON:  CT 03/27/2017 FINDINGS: MRI HEAD FINDINGS Brain: 1 x 2 cm acute infarction in the right parietal deep white matter. No other acute infarction. The brainstem and cerebellum are unremarkable. Cerebral hemispheres elsewhere show generalized atrophy with confluent chronic small vessel disease of the white matter. No mass lesion, hemorrhage, hydrocephalus or extra-axial collection. Vascular: Major vessels at the base of the brain show flow. Skull and upper cervical spine: Negative Sinuses/Orbits: Clear/normal Other: None MRI CERVICAL SPINE FINDINGS Alignment: Normal Vertebrae: No fracture or primary bone  lesion. Cord: No cord compression or primary cord lesion. Posterior Fossa, vertebral arteries, paraspinal tissues: Negative Disc levels: Foramen  magnum, C1-2 and C2-3:  Normal. C3-4:  Disc bulge.  No stenosis or neural compression. C4-5: Endplate osteophytes and bulging of the disc. Narrowing of the subarachnoid space. AP diameter of the canal measures 8 mm. Bilateral neural foraminal stenosis. C5-6: Endplate osteophytes and bulging of the disc. Effacement of the subarachnoid space. AP diameter of the canal measures 8 mm. Bilateral foraminal stenosis. C6-7: Spondylosis with endplate osteophytes and bulging of the disc. Narrowing the ventral subarachnoid space. Mild foraminal stenosis bilaterally left more than right. C7-T1:  Normal interspace. Upper thoracic region unremarkable. IMPRESSION: MRI head: 1 x 2 cm acute infarction in the right parietal deep white matter. Atrophy and extensive chronic small vessel ischemic changes elsewhere. MRI cervical spine: No acute or traumatic finding. Mid cervical spondylosis. Spinal stenosis at C4-5, C5-6 and C6-7 with effacement of the subarachnoid space but no actual cord compression. Foraminal stenosis bilaterally at those levels that could affect the C5, C6 or C7 nerve roots. Electronically Signed   By: Paulina Fusi M.D.   On: 03/28/2016 12:53   Mr Cervical Spine Wo Contrast  Result Date: 03/28/2016 CLINICAL DATA:  Acute onset of dizziness with fall and hip fracture. EXAM: MRI HEAD WITHOUT CONTRAST MRI CERVICAL SPINE WITHOUT CONTRAST TECHNIQUE: Multiplanar, multiecho pulse sequences of the brain and surrounding structures, and cervical spine, to include the craniocervical junction and cervicothoracic junction, were obtained without intravenous contrast. COMPARISON:  CT 03/27/2017 FINDINGS: MRI HEAD FINDINGS Brain: 1 x 2 cm acute infarction in the right parietal deep white matter. No other acute infarction. The brainstem and cerebellum are unremarkable. Cerebral hemispheres  elsewhere show generalized atrophy with confluent chronic small vessel disease of the white matter. No mass lesion, hemorrhage, hydrocephalus or extra-axial collection. Vascular: Major vessels at the base of the brain show flow. Skull and upper cervical spine: Negative Sinuses/Orbits: Clear/normal Other: None MRI CERVICAL SPINE FINDINGS Alignment: Normal Vertebrae: No fracture or primary bone lesion. Cord: No cord compression or primary cord lesion. Posterior Fossa, vertebral arteries, paraspinal tissues: Negative Disc levels: Foramen magnum, C1-2 and C2-3:  Normal. C3-4:  Disc bulge.  No stenosis or neural compression. C4-5: Endplate osteophytes and bulging of the disc. Narrowing of the subarachnoid space. AP diameter of the canal measures 8 mm. Bilateral neural foraminal stenosis. C5-6: Endplate osteophytes and bulging of the disc. Effacement of the subarachnoid space. AP diameter of the canal measures 8 mm. Bilateral foraminal stenosis. C6-7: Spondylosis with endplate osteophytes and bulging of the disc. Narrowing the ventral subarachnoid space. Mild foraminal stenosis bilaterally left more than right. C7-T1:  Normal interspace. Upper thoracic region unremarkable. IMPRESSION: MRI head: 1 x 2 cm acute infarction in the right parietal deep white matter. Atrophy and extensive chronic small vessel ischemic changes elsewhere. MRI cervical spine: No acute or traumatic finding. Mid cervical spondylosis. Spinal stenosis at C4-5, C5-6 and C6-7 with effacement of the subarachnoid space but no actual cord compression. Foraminal stenosis bilaterally at those levels that could affect the C5, C6 or C7 nerve roots. Electronically Signed   By: Paulina Fusi M.D.   On: 03/28/2016 12:53   Pelvis Portable  Result Date: 03/27/2016 CLINICAL DATA:  Post LEFT hip replacement EXAM: PORTABLE PELVIS 1-2 VIEWS COMPARISON:  Portable exam 1222 hours compared to preoperative images of 03/27/2016 FINDINGS: LEFT hip prosthesis in expected  position. Bones demineralized. No acute fracture, dislocation, or bone destruction. IM nail with compression screw proximal RIGHT femur post ORIF of an intertrochanteric fracture. IMPRESSION: Interval resection of the LEFT femoral head  and placement of a LEFT hip prosthesis. No acute abnormalities. Electronically Signed   By: Ulyses Southward M.D.   On: 03/27/2016 13:08   Dg Hip Unilat W Or Wo Pelvis 2-3 Views Left  Result Date: 03/27/2016 CLINICAL DATA:  Status post fall, with left hip pain. Initial encounter. EXAM: DG HIP (WITH OR WITHOUT PELVIS) 2-3V LEFT COMPARISON:  None. FINDINGS: There appears to be a transcervical fracture through the left femoral neck, with superior displacement of the distal femur. The right femoral head remains seated at the acetabulum. Right femoral hardware is grossly unremarkable, though incompletely imaged. Mild degenerative change is noted along the lower lumbar spine. The sacroiliac joints are grossly unremarkable. The visualized bowel gas pattern is within normal limits. IMPRESSION: Apparent transcervical fracture through the left femoral neck, with superior displacement of the distal femur. Electronically Signed   By: Roanna Raider M.D.   On: 03/27/2016 02:49    DISCHARGE EXAMINATION: Vitals:   03/29/16 0422 03/29/16 1405 03/29/16 2300 03/30/16 0607  BP: 128/62 (!) 141/90 131/61 136/67  Pulse: 88 90 73 84  Resp: 16 17 17 18   Temp: 99 F (37.2 C) 98.6 F (37 C) 98.1 F (36.7 C) 98.2 F (36.8 C)  TempSrc: Oral Oral Oral Oral  SpO2: 94% 96% 97% 98%  Weight:      Height:       Resp: clear to auscultation bilaterally Cardio: regular rate and rhythm, S1, S2 normal, no murmur, click, rub or gallop GI: soft, non-tender; bowel sounds normal; no masses,  no organomegaly Extremities: No bruising noted over the left thigh Strength has improved and the left shoulder and elbow, but poor strength noted in the wrist and fingers/hand.  DISPOSITION: SNF  Discharge  Instructions    Ambulatory referral to Neurology    Complete by:  As directed    An appointment is requested in approximately: 4 weeks for stroke follow up   Call MD for:  difficulty breathing, headache or visual disturbances    Complete by:  As directed    Call MD for:  extreme fatigue    Complete by:  As directed    Call MD for:  persistant dizziness or light-headedness    Complete by:  As directed    Call MD for:  persistant nausea and vomiting    Complete by:  As directed    Call MD for:  severe uncontrolled pain    Complete by:  As directed    Call MD for:  temperature >100.4    Complete by:  As directed    Diet - low sodium heart healthy    Complete by:  As directed    Discharge instructions    Complete by:  As directed    Please see instructions on the discharge summary.   You were cared for by a hospitalist during your hospital stay. If you have any questions about your discharge medications or the care you received while you were in the hospital after you are discharged, you can call the unit and asked to speak with the hospitalist on call if the hospitalist that took care of you is not available. Once you are discharged, your primary care physician will handle any further medical issues. Please note that NO REFILLS for any discharge medications will be authorized once you are discharged, as it is imperative that you return to your primary care physician (or establish a relationship with a primary care physician if you do not have one) for your  aftercare needs so that they can reassess your need for medications and monitor your lab values. If you do not have a primary care physician, you can call [phone] for a physician referral.   Increase activity slowly    Complete by:  As directed    Weight bearing as tolerated    Complete by:  As directed    Laterality:  left   Extremity:  Lower      ALLERGIES:  Allergies  Allergen Reactions  . Penicillins Hives and Rash      Current Discharge Medication List    START taking these medications   Details  acetaminophen (TYLENOL) 325 MG tablet Take 2 tablets (650 mg total) by mouth every 6 (six) hours as needed for mild pain (or Fever >/= 101).    atorvastatin (LIPITOR) 10 MG tablet Take 1 tablet (10 mg total) by mouth daily at 6 PM.    clopidogrel (PLAVIX) 75 MG tablet Take 1 tablet (75 mg total) by mouth daily.    enoxaparin (LOVENOX) 30 MG/0.3ML injection Inject 0.3 mLs (30 mg total) into the skin daily. For 2 weeks. Qty: 0 Syringe    feeding supplement, ENSURE ENLIVE, (ENSURE ENLIVE) LIQD Take 237 mLs by mouth 2 (two) times daily between meals. Qty: 237 mL, Refills: 12    HYDROcodone-acetaminophen (NORCO/VICODIN) 5-325 MG tablet Take 1-2 tablets by mouth every 6 (six) hours as needed for moderate pain. Qty: 15 tablet, Refills: 0    meclizine (ANTIVERT) 12.5 MG tablet Take 1 tablet (12.5 mg total) by mouth 2 (two) times daily as needed for dizziness. Qty: 30 tablet, Refills: 0      CONTINUE these medications which have NOT CHANGED   Details  metoprolol tartrate (LOPRESSOR) 25 MG tablet Take 25 mg by mouth 2 (two) times daily.      STOP taking these medications     aspirin EC 81 MG tablet      clotrimazole (MYCELEX) 10 MG troche           Contact information for follow-up providers    Nadara Mustard, MD Follow up in 2 week(s).   Specialty:  Orthopedic Surgery Contact information: [address] [place] [address] [phone]        Delia Heady, MD. Schedule an appointment as soon as possible for a visit in 4 week(s).   Specialties:  Neurology, Radiology Contact information: [address] [place] [address] [phone]            Contact information for after-discharge care    Destination    [facility] Follow up.   Specialty:  Skilled Nursing Facility Contact information: [address] [place]  [address] [phone]                  TOTAL DISCHARGE TIME: 35 minutes  [facility]  [facility] Pager [phone]  03/30/2016, 1:30 PM

## 2016-03-31 LAB — VAS US CAROTID
LCCADDIAS: -26 cm/s
LCCADSYS: -89 cm/s
LCCAPDIAS: 29 cm/s
LCCAPSYS: 101 cm/s
LEFT ECA DIAS: -13 cm/s
LEFT VERTEBRAL DIAS: 23 cm/s
Left ICA dist dias: -32 cm/s
Left ICA dist sys: -129 cm/s
Left ICA prox dias: -20 cm/s
Left ICA prox sys: -101 cm/s
RCCADSYS: 57 cm/s
RIGHT ECA DIAS: -12 cm/s
RIGHT VERTEBRAL DIAS: -16 cm/s
Right CCA prox dias: 16 cm/s
Right CCA prox sys: 96 cm/s

## 2016-03-31 NOTE — Progress Notes (Addendum)
[facility] PROGRESS NOTE  Jessica Booth ZOX:[id number] DOB: Mar 17, 1922 DOA: 03/27/2016  PCP: Vivien Presto, MD  Brief History/Interval Summary: 83 year old Caucasian female with a past medical history of coronary artery disease status post stenting, hypertension, presented after a fall at home. This resulted in left hip fracture. Patient apparently had been feeling dizzy on and off and had a syncopal episode resulting in the fall. Patient underwent surgery for her hip fracture. Subsequently, she was having left arm weakness. Workup revealed acute stroke. Neurology was consulted.  Reason for Visit: Left hip fracture. Syncope. Left arm weakness.  Consultants: Orthopedics. Neurology  Procedures:  Left hip hemiarthroplasty 3/21  Carotid Doppler Preliminary report: 1-39% ICA plaquing. Vertebral artery flow is antegrade.    Echocardiogram Study Conclusions  - Left ventricle: The cavity size was normal. There was mild concentric hypertrophy. Systolic function was vigorous. The estimated ejection fraction was in the range of 65% to 70%. Wall motion was normal; there were no regional wall motion abnormalities. Doppler parameters are consistent with abnormal left ventricular relaxation (grade 1 diastolic dysfunction). There was no evidence of elevated ventricular filling pressure by Doppler parameters. - Aortic valve: Trileaflet; normal thickness leaflets. There was no regurgitation. - Aortic root: The aortic root was normal in size. - Mitral valve: Calcified annulus. Mildly thickened leaflets . There was no regurgitation. - Left atrium: The atrium was normal in size. - Right ventricle: The cavity size was normal. Wall thickness was normal. Systolic function was normal. - Tricuspid valve: There was trivial regurgitation. - Pulmonary arteries: Systolic pressure was within the normal range. - Inferior vena cava: The vessel was normal in size. -  Pericardium, extracardiac: There was no pericardial effusion.  Impressions:  - No cardiac source of emboli was indentified.   Antibiotics: None  Subjective/Interval History: Patient denies any complaints. Continues to have movement in her left shoulder and left elbow. Still unable to move her wrist and fingers. Pain in the left hip is much better controlled. Denies any shortness of breath.   ROS: Denies any nausea, vomiting.  Objective:  Vital Signs  Vitals:   03/30/16 0607 03/30/16 1500 03/30/16 2144 03/31/16 0704  BP: 136/67 140/65 128/66 125/62  Pulse: 84 (!) 102 81 82  Resp: 18 17 16 16   Temp: 98.2 F (36.8 C) 98.7 F (37.1 C) 98.2 F (36.8 C) 98.6 F (37 C)  TempSrc: Oral Oral Oral Oral  SpO2: 98% 98% 97% 98%  Weight:      Height:        Intake/Output Summary (Last 24 hours) at 03/31/16 8119 Last data filed at 03/31/16 0300  Gross per 24 hour  Intake              813 ml  Output              300 ml  Net              513 ml   Filed Weights   03/27/16 0131  Weight: 49.9 kg (110 lb)    General appearance: alert, cooperative, appears stated age and no distress Resp: clear to auscultation bilaterally Cardio: regular rate and rhythm, S1, S2 normal, no murmur, click, rub or gallop GI: soft, non-tender; bowel sounds normal; no masses,  no organomegaly Extremities: No bruising noted over the left thigh. No tenderness with movement of her left wrist. Neurologic: Awake and alert. Oriented to person, place. Cranial nerves II-12 intact. Motor strength in the left arm: 4-5 out of 5  in the left shoulder. 4 out of 5 in the left elbow. Still about 1-2 out of 5 in the left wrist.   Lab Results:  Data Reviewed: I have personally reviewed following labs and imaging studies  CBC:  Recent Labs Lab 03/27/16 0223 03/27/16 0759 03/28/16 0559 03/29/16 0242 03/30/16 0516  WBC 8.0 10.0 9.9 11.5* 9.3  NEUTROABS 5.4 6.9  --   --   --   HGB 13.5 13.8 11.7* 10.8* 11.3*    HCT 39.4 40.4 35.7* 32.5* 33.8*  MCV 93.4 94.0 96.0 95.6 95.2  PLT 222 216 187 174 162    Basic Metabolic Panel:  Recent Labs Lab 03/27/16 0223 03/27/16 0759 03/28/16 0559 03/29/16 0242 03/30/16 0516  NA 141 142 140 136 141  K 3.9 4.4 4.0 3.9 4.0  CL 106 107 105 105 106  CO2 22 22 21* 23 24  GLUCOSE 136* 103* 111* 128* 98  BUN 16 14 14 18 20   CREATININE 1.12* 1.07* 1.10* 1.07* 0.98  CALCIUM 9.3 9.3 8.2* 8.6* 8.7*    GFR: Estimated Creatinine Clearance: 29.5 mL/min (by C-G formula based on SCr of 0.98 mg/dL).  Liver Function Tests:  Recent Labs Lab 03/27/16 0223 03/27/16 0759  AST 52* 49*  ALT 47 46  ALKPHOS 44 42  BILITOT 0.9 1.1  PROT 6.6 6.8  ALBUMIN 4.2 4.2     Radiology Studies: No results found.   Medications:  Scheduled: . atorvastatin  10 mg Oral q1800  . clopidogrel  75 mg Oral Daily  . enoxaparin (LOVENOX) injection  30 mg Subcutaneous Q24H  . feeding supplement (ENSURE ENLIVE)  237 mL Oral BID BM  . LORazepam  0.5 mg Intravenous Once  . tranexamic acid (CYKLOKAPRON) topical -INTRAOP  2,000 mg Topical Once   Continuous: . sodium chloride 50 mL/hr at 03/27/16 2111  . lactated ringers 50 mL/hr (03/30/16 0600)   YQM:VHQIONGEXBMWU **OR** acetaminophen, HYDROcodone-acetaminophen, LORazepam, meclizine, menthol-cetylpyridinium **OR** phenol, metoCLOPramide **OR** metoCLOPramide (REGLAN) injection, morphine injection, ondansetron **OR** ondansetron (ZOFRAN) IV  Assessment/Plan:  Active Problems:   LEFT Femoral neck Hip fracture (HCC)   Essential hypertension   CAD (coronary artery disease)   Transcervical fracture of femur, left, closed, initial encounter (HCC)   Stroke (cerebrum) (HCC)   Left arm weakness   S/P hip hemiarthroplasty    Acute stroke with Left arm weakness Unclear when this occurred. Due to unknown time of onset, she was not a candidate for TPA. Patient's MRI did show acute stroke. Neurology was consulted. Patient underwent  stroke workup. Echocardiogram, carotid Doppler as above. HbA1c is 5.1. LDL is 86. Started on Lipitor. She has had some improvement in the strength in the left upper extremity. Still unable to move her hand or wrist. Will need rehabilitation at skilled nursing facility. No significant process noted in the MRI of the cervical spine. Plavix recommended for secondary stroke prevention.  Left femoral fracture status post fall Patient is status post surgical intervention and left hip hemiarthroplasty. Seems to be stable. Pain control. Due to hip fracture and stroke, patient will have limited mobility. She will be at risk to develop DVT. Will benefit from Lovenox for 2 weeks.  Syncope, likely secondary to vertigo EKG did not show any ischemic changes. Syncope occurred in the setting of "dizziness". This she describes as a sensation of room spinning around her. She has long-standing ear problems. Most likely she had vertigo resulting in a fall. Stroke could've also contributed. Meclizine as needed.  History of essential  hypertension According to the patient's granddaughter, patient has high blood pressure and has not been taking her medications and she started taking her medications about 2 weeks ago. This could've dropped her blood pressure resulting in syncope. Blood pressure medications were held due to acute stroke.   History of CAD Appears to be stable from a cardiac standpoint. Denies any chest pains.  Depressed mood. Patient voices frustration that she has multiple medical problems. She seems to be upset at times. Discussed with her granddaughter. Granddaughter asked me if an antidepressant could be prescribed. Have told her that this could be normal reaction to acute illness. She will need to be reassured and if her symptoms persist in a few weeks and if they affect her daily functioning, then medications can be considered. However, at this time, we will not recommend starting her on any medication  due to potential for side effects in elderly patients.  DVT Prophylaxis: On aspirin per orthopedics   Code Status: DO NOT RESUSCITATE  Family Communication: Discussed with the patient and her granddaughter  Disposition Plan: Await placement to skilled nursing facility.    LOS: 4 days   [facility] LLCKRISHNAN,Galileo Colello  [facility] Pager [phone] 03/31/2016, 8:38 AM  If 7PM-7AM, please contact night-coverage at [email or web address], password [email or web address]

## 2016-03-31 NOTE — Progress Notes (Signed)
Subjective: 4 Days Post-Op Procedure(s) (LRB): ARTHROPLASTY BIPOLAR HIP (HEMIARTHROPLASTY) (Left) Patient reports pain as mild.    Objective: Vital signs in last 24 hours: Temp:  [98.2 F (36.8 C)-98.7 F (37.1 C)] 98.6 F (37 C) (03/25 0704) Pulse Rate:  [81-102] 82 (03/25 0704) Resp:  [16-17] 16 (03/25 0704) BP: (125-140)/(62-66) 125/62 (03/25 0704) SpO2:  [97 %-98 %] 98 % (03/25 0704) Awake, HOH, seems appropriate Intake/Output from previous day: 03/24 0701 - 03/25 0700 In: 1053 [P.O.:653; I.V.:400] Out: 300 [Urine:300] Intake/Output this shift: No intake/output data recorded.   Recent Labs  03/29/16 0242 03/30/16 0516  HGB 10.8* 11.3*    Recent Labs  03/29/16 0242 03/30/16 0516  WBC 11.5* 9.3  RBC 3.40* 3.55*  HCT 32.5* 33.8*  PLT 174 162    Recent Labs  03/29/16 0242 03/30/16 0516  NA 136 141  K 3.9 4.0  CL 105 106  CO2 23 24  BUN 18 20  CREATININE 1.07* 0.98  GLUCOSE 128* 98  CALCIUM 8.6* 8.7*   No results for input(s): LABPT, INR in the last 72 hours.    Assessment/Plan: 4 Days Post-Op Procedure(s) (LRB): ARTHROPLASTY BIPOLAR HIP (HEMIARTHROPLASTY) (Left) Up with therapy Discharge to SNF Neurology has signed off-see note, awaiting NH admission-possibly tomorrow Jessica Booth W Jessica Booth 03/31/2016, 9:14 AM

## 2016-04-01 NOTE — Clinical Social Work Note (Signed)
Clinical Social Worker facilitated patient discharge including contacting patient family and facility to confirm patient discharge plans.  Clinical information faxed to facility and family agreeable with plan.  CSW arranged [facility] .  RN to call [phone] for report prior to discharge.  Clinical Social Worker will sign off for now as social work intervention is no longer needed. Please consult us again if new need arises.  [address]Jessica Booth, [place] [phone]

## 2016-04-01 NOTE — Progress Notes (Addendum)
qPhysical Therapy Treatment Patient Details Name: Jessica Booth MRN: [id number] DOB: Jun 22, 1922 Today's Date: 04/01/2016    History of Present Illness 83 y.o. female with a past medical history of coronary artery disease status post stenting, hypertension, presented after a fall at home. This resulted in left hip fracture. Patient apparently had been feeling dizzy on and off and had a syncopal episode resulting in the fall. Patient went under a left hip hemiarthroplasty yesterday on 03/28/2010. Surgery was noted the patient was unable to lift her left arm since surgery. Right brain was obtained and showed an acute stroke in the right parietal area.    PT Comments    Pt admitted with above diagnosis. Pt currently with functional limitations due to the deficits listed below (see PT Problem List). Pt was able to use hemiwalker to pivot to recliner today with cues for postural stability but better stability than in past.  Able to weight bear on bil LEs without LE buckling on right and left LE in less pain per pt.  Did c/o dizziness but this is a chronic problem for pt.  Texted MD regarding scheduling Antivert since it is difficult to treat currently with all pts other issues.  Will continue PT.   Pt will benefit from skilled PT to increase their independence and safety with mobility to allow discharge to the venue listed below.     Follow Up Recommendations  SNF;Supervision/Assistance - 24 hour     Equipment Recommendations  None recommended by PT    Recommendations for Other Services       Precautions / Restrictions Precautions Precautions: Posterior Hip Precaution Booklet Issued: No Precaution Comments: verbally educated Required Braces or Orthoses: Other Brace/Splint Other Brace/Splint: Pillow in place for hip precautions and maintained after session Restrictions Weight Bearing Restrictions: Yes LLE Weight Bearing: Weight bearing as tolerated    Mobility  Bed Mobility Overal bed  mobility: Needs Assistance Bed Mobility: Supine to Sit;Sit to Supine     Supine to sit: +2 for physical assistance;Mod assist     General bed mobility comments: come to EOB towards strong side. utilized pad underneath pt. mod +2 assist this session with pt doing a little more work.  Did need assist to steady once she got upright as she tends to lean posteriorly.   Transfers Overall transfer level: Needs assistance Equipment used: Hemi-walker Transfers: Sit to/from [employer] Sit to Stand: Mod assist;+2 physical assistance;From elevated surface Stand pivot transfers: Max assist;+2 physical assistance;From elevated surface;Mod assist       General transfer comment: Pt was able to stand with assist and cues to power up.  Pt was able to stand upright with incr cues as initially was flexed forward but was able to stand close to fully upright.  Pt needs max assist and cues for postural and  trunk stability.   Ambulation/Gait Ambulation/Gait assistance: Mod assist;Max assist;+2 physical assistance Ambulation Distance (Feet): 2 Feet Assistive device: Hemi-walker Gait Pattern/deviations: Step-to pattern;Decreased step length - right;Decreased step length - left;Decreased stride length;Decreased stance time - left;Decreased weight shift to left;Antalgic;Leaning posteriorly;Staggering left;Staggering right;Trunk flexed   Gait velocity interpretation: Below normal speed for age/gender General Gait Details: Pt was able to take 2 steps forward and then slight pivot around to chair needing cues and assist for upright posture and stability.  When pt stood tall was able to take a few steps and with good technique.  Fatigues quickly.  Pt reported her heart racing after gettiting to chair and  reports dizziness.  BP 143/73 and HR 89 bpm with O2 sat 95%.   Pt wtith hx of vertigo.     Stairs            Wheelchair Mobility    Modified Rankin (Stroke Patients Only)       Balance  Overall balance assessment: History of Falls;Needs assistance Sitting-balance support: Single extremity supported;Feet supported Sitting balance-Leahy Scale: Fair Sitting balance - Comments: sat min guard EOB for about 10 minutes   Standing balance support: Single extremity supported;During functional activity Standing balance-Leahy Scale: Poor Standing balance comment: Needs mod to max assist of 2 and max cues to stand EOB with single UE support poor trunk/postural stability.                             Cognition Arousal/Alertness: Awake/alert Behavior During Therapy: Anxious;Flat affect Overall Cognitive Status: Impaired/Different from baseline                                 General Comments: delayed processing, sustained attention level, verbal perseveration. Oriented to place, time, grossly oriented to situation      Exercises General Exercises - Lower Extremity Ankle Circles/Pumps: AROM;Both;10 reps;Supine Quad Sets: AROM;Both;15 reps;Supine Heel Slides: AROM;Both;10 reps;Supine Hip ABduction/ADduction: AROM;Left;10 reps;Supine    General Comments        Pertinent Vitals/Pain Pain Assessment: Faces Faces Pain Scale: Hurts whole lot Pain Location: L hip, LUE, neck. increased pain with bed mobility. Pain Descriptors / Indicators: Aching;Grimacing;Guarding;Moaning Pain Intervention(s): Limited activity within patient's tolerance;Monitored during session;Repositioned  95% O2, 148/73, 89 bpm.   Home Living                      Prior Function            PT Goals (current goals can now be found in the care plan section) Progress towards PT goals: Progressing toward goals    Frequency    Min 3X/week      PT Plan Current plan remains appropriate    Co-evaluation             End of Session Equipment Utilized During Treatment: Gait belt Activity Tolerance: Patient limited by pain;Patient limited by fatigue Patient left:  with call bell/phone within reach;with chair alarm set;in chair Nurse Communication: Mobility status;Need for lift equipment PT Visit Diagnosis: History of falling (Z91.81);Difficulty in walking, not elsewhere classified (R26.2);Pain Pain - Right/Left: Left Pain - part of body: Hip     Time: [id number] PT Time Calculation (min) (ACUTE ONLY): 25 min  Charges:  $Gait Training: 8-22 mins $Therapeutic Exercise: 8-22 mins                    G Codes:       Jessica Booth,PT Acute Rehabilitation [phone]   Jessica LopesDawn F Annjanette Booth 04/01/2016, 10:26 AM

## 2016-04-01 NOTE — Progress Notes (Signed)
04/01/16  1230  Report called and given to Cala BradfordKimberly [phone] [facility]

## 2016-04-01 NOTE — Progress Notes (Signed)
04/01/16  1115  Reviewed discharge instructions with patient. Patient verbalized understanding of discharge instructions. Copy of discharge given to patient.

## 2016-04-01 NOTE — Discharge Summary (Signed)
[facility]  Physician Discharge Summary   [name] MRN: [id number] DOB/AGE: 1922-11-12 83 y.o.  Admit date: 03/27/2016 Discharge date: 04/01/2016  PCP: Vivien Presto, MD  DISCHARGE DIAGNOSES:  Active Problems:   LEFT Femoral neck Hip fracture (HCC)   Essential hypertension   CAD (coronary artery disease)   Transcervical fracture of femur, left, closed, initial encounter (HCC)   Stroke (cerebrum) (HCC)   Left arm weakness   S/P hip hemiarthroplasty   RECOMMENDATIONS FOR OUTPATIENT FOLLOW UP: 1. Needs outpatient follow-up with orthopedics and neurology. 2. CBC and basic metabolic panel 1 week   DISCHARGE CONDITION: fair  Diet recommendation: Heart healthy  Filed Weights   03/27/16 0131  Weight: 49.9 kg (110 lb)    INITIAL HISTORY: 83 year old Caucasian female with a past medical history of coronary artery disease status post stenting, hypertension, presented after a fall at home. This resulted in left hip fracture. Patient apparently had been feeling dizzy on and off and had a syncopal episode resulting in the fall. Patient underwent surgery for her hip fracture. Subsequently, she was having left arm weakness. Workup revealed acute stroke. Neurology was consulted.  Consultants: Orthopedics. Neurology  Procedures:  Left hip hemiarthroplasty 3/21  Carotid Doppler Preliminary report:  1-39% ICA plaquing. Vertebral artery flow is antegrade.    Echocardiogram Study Conclusions  - Left ventricle: The cavity size was normal. There was mild   concentric hypertrophy. Systolic function was vigorous. The   estimated ejection fraction was in the range of 65% to 70%. Wall   motion was normal; there were no regional wall motion   abnormalities. Doppler parameters are consistent with abnormal   left ventricular relaxation (grade 1 diastolic dysfunction).   There was no evidence of elevated ventricular filling pressure by   Doppler  parameters. - Aortic valve: Trileaflet; normal thickness leaflets. There was no   regurgitation. - Aortic root: The aortic root was normal in size. - Mitral valve: Calcified annulus. Mildly thickened leaflets .   There was no regurgitation. - Left atrium: The atrium was normal in size. - Right ventricle: The cavity size was normal. Wall thickness was   normal. Systolic function was normal. - Tricuspid valve: There was trivial regurgitation. - Pulmonary arteries: Systolic pressure was within the normal   range. - Inferior vena cava: The vessel was normal in size. - Pericardium, extracardiac: There was no pericardial effusion.  Impressions: - No cardiac source of emboli was indentified.   HOSPITAL COURSE:   Acute stroke with Left arm weakness Unclear when this occurred. Due to unknown time of onset she was not a candidate for TPA. Patient's MRI did show acute stroke. Neurology was consulted. Stroke workup was initiated. Echocardiogram, carotid Doppler as above., HbA1c is 5.1. LDL is 86. Started on Lipitor. She has had some improvement in the strength in the left upper extremity. Still unable to move her hand or wrist. Will need rehabilitation at skilled nursing facility. No significant process noted in the MRI of the cervical spine. Plavix recommended for secondary stroke prevention. Discussed with her granddaughter.  Left femoral fracture status post fall Patient is status post surgical intervention and left hip hemiarthroplasty. Seems to be stable. Pain control. Due to hip fracture and stroke, patient will have limited mobility. She will be at risk to develop DVT. Will benefit from Lovenox for 2 weeks.  Syncope, likely secondary to vertigo EKG did not show any ischemic changes. Syncope occurred in the setting of "dizziness". This she describes  as a sensation of room spinning around her. She has long-standing ear problems. Most likely she had vertigo resulting in a fall. Continue to  monitor on telemetry. Stroke could've contributing as well. Meclizine as needed. Could try scheduled doses if symptoms worsen but will watch for sedation.  History of essential hypertension According to the patient's granddaughter, patient has high blood pressure and has not been taking her medications and she started taking her medications about 2 weeks ago. This could've dropped her blood pressure resulting in syncope. Blood pressure medications were held in the hospital due to acute stroke. Now resumed.  History of CAD Appears to be stable from a cardiac standpoint. Denies any chest pains.  Depressed mood. Patient voices frustration that she has multiple medical problems. She seems to be upset at times. Discussed with her granddaughter. Granddaughter asked me if an antidepressant could be prescribed. Have told her that this could be normal reaction to acute illness. She will need to be reassured and if her symptoms persist in a few weeks and if they affect her daily functioning, then medications can be considered. However, at this time, we will not advice to start her on any medication due to potential for side effects in elderly patients. In better spirits over last 2 days. Continue to monitor.  Remains stable. Okay for discharge to skilled nursing facility.   PERTINENT LABS:  The results of significant diagnostics from this hospitalization (including imaging, microbiology, ancillary and laboratory) are listed below for reference.     Labs: Basic Metabolic Panel:  Recent Labs Lab 03/27/16 0223 03/27/16 0759 03/28/16 0559 03/29/16 0242 03/30/16 0516  NA 141 142 140 136 141  K 3.9 4.4 4.0 3.9 4.0  CL 106 107 105 105 106  CO2 22 22 21* 23 24  GLUCOSE 136* 103* 111* 128* 98  BUN 16 14 14 18 20   CREATININE 1.12* 1.07* 1.10* 1.07* 0.98  CALCIUM 9.3 9.3 8.2* 8.6* 8.7*   Liver Function Tests:  Recent Labs Lab 03/27/16 0223 03/27/16 0759  AST 52* 49*  ALT 47 46  ALKPHOS 44  42  BILITOT 0.9 1.1  PROT 6.6 6.8  ALBUMIN 4.2 4.2   CBC:  Recent Labs Lab 03/27/16 0223 03/27/16 0759 03/28/16 0559 03/29/16 0242 03/30/16 0516  WBC 8.0 10.0 9.9 11.5* 9.3  NEUTROABS 5.4 6.9  --   --   --   HGB 13.5 13.8 11.7* 10.8* 11.3*  HCT 39.4 40.4 35.7* 32.5* 33.8*  MCV 93.4 94.0 96.0 95.6 95.2  PLT 222 216 187 174 162     IMAGING STUDIES Dg Chest 1 View  Result Date: 03/27/2016 CLINICAL DATA:  Chronic dizziness. Status post fall onto left side. Concern for chest injury. Initial encounter. EXAM: CHEST 1 VIEW COMPARISON:  Chest radiograph performed 03/21/2014 FINDINGS: The lungs are well-aerated. Scarring is noted at the right lung apex. There is no evidence of pleural effusion or pneumothorax. The cardiomediastinal silhouette is borderline normal in size. No acute osseous abnormalities are seen. IMPRESSION: Scarring at the right lung apex. No acute cardiopulmonary process seen. Electronically Signed   By: Roanna Raider M.D.   On: 03/27/2016 02:48   Ct Head Wo Contrast  Result Date: 03/27/2016 CLINICAL DATA:  Status post fall on left side in bathroom. Concern for head or cervical spine injury. Initial encounter. EXAM: CT HEAD WITHOUT CONTRAST CT CERVICAL SPINE WITHOUT CONTRAST TECHNIQUE: Multidetector CT imaging of the head and cervical spine was performed following the standard protocol without intravenous contrast.  Multiplanar CT image reconstructions of the cervical spine were also generated. COMPARISON:  None. FINDINGS: CT HEAD FINDINGS Brain: No evidence of acute infarction, hemorrhage, hydrocephalus, extra-axial collection or mass lesion/mass effect. Prominence of the ventricles and sulci reflects moderately severe cortical volume loss. Cerebellar atrophy is noted. Scattered periventricular and subcortical matter change likely reflects small vessel ischemic microangiopathy. The brainstem and fourth ventricle are within normal limits. The basal ganglia are unremarkable in  appearance. The cerebral hemispheres demonstrate grossly normal Hitchens-white differentiation. No mass effect or midline shift is seen. Vascular: No hyperdense vessel or unexpected calcification. Skull: There is no evidence of fracture; visualized osseous structures are unremarkable in appearance. Sinuses/Orbits: The orbits are within normal limits. The paranasal sinuses and mastoid air cells are well-aerated. Other: Soft tissue swelling is noted overlying the left parietal calvarium. CT CERVICAL SPINE FINDINGS Alignment: There is mild grade 1 retrolisthesis of C4 on C5 and of C5 on C6, with associated disc space narrowing. Skull base and vertebrae: No acute fracture. No primary bone lesion or focal pathologic process. Soft tissues and spinal canal: No prevertebral fluid or swelling. No visible canal hematoma. Disc levels: Multilevel disc space narrowing along the cervical spine, with scattered anterior and posterior disc osteophyte complexes. Degenerative change is noted about the dens. Upper chest: Scarring is noted at the lung apices, with associated calcification at the right lung apex. Calcification is noted at the carotid bifurcations bilaterally. A 1.3 cm hypodensity at the left thyroid lobe is likely benign, given its size. Other: No additional soft tissue abnormalities are seen. IMPRESSION: 1. No evidence of traumatic intracranial injury or fracture. 2. No evidence of fracture or subluxation along the cervical spine. 3. Soft tissue swelling overlying the left parietal calvarium. 4. Moderately severe cortical volume loss and scattered small vessel ischemic microangiopathy. 5. Mild degenerative change along the cervical spine. 6. Scarring at the lung apices, with associated calcification at the right lung apex. 7. Calcification at the carotid bifurcations bilaterally. Carotid ultrasound would be helpful for further evaluation, when and as deemed clinically appropriate. Electronically Signed   By: Roanna Raider  M.D.   On: 03/27/2016 02:26   Ct Cervical Spine Wo Contrast  Result Date: 03/27/2016 CLINICAL DATA:  Status post fall on left side in bathroom. Concern for head or cervical spine injury. Initial encounter. EXAM: CT HEAD WITHOUT CONTRAST CT CERVICAL SPINE WITHOUT CONTRAST TECHNIQUE: Multidetector CT imaging of the head and cervical spine was performed following the standard protocol without intravenous contrast. Multiplanar CT image reconstructions of the cervical spine were also generated. COMPARISON:  None. FINDINGS: CT HEAD FINDINGS Brain: No evidence of acute infarction, hemorrhage, hydrocephalus, extra-axial collection or mass lesion/mass effect. Prominence of the ventricles and sulci reflects moderately severe cortical volume loss. Cerebellar atrophy is noted. Scattered periventricular and subcortical matter change likely reflects small vessel ischemic microangiopathy. The brainstem and fourth ventricle are within normal limits. The basal ganglia are unremarkable in appearance. The cerebral hemispheres demonstrate grossly normal Lindor-white differentiation. No mass effect or midline shift is seen. Vascular: No hyperdense vessel or unexpected calcification. Skull: There is no evidence of fracture; visualized osseous structures are unremarkable in appearance. Sinuses/Orbits: The orbits are within normal limits. The paranasal sinuses and mastoid air cells are well-aerated. Other: Soft tissue swelling is noted overlying the left parietal calvarium. CT CERVICAL SPINE FINDINGS Alignment: There is mild grade 1 retrolisthesis of C4 on C5 and of C5 on C6, with associated disc space narrowing. Skull base and vertebrae: No acute fracture.  No primary bone lesion or focal pathologic process. Soft tissues and spinal canal: No prevertebral fluid or swelling. No visible canal hematoma. Disc levels: Multilevel disc space narrowing along the cervical spine, with scattered anterior and posterior disc osteophyte complexes.  Degenerative change is noted about the dens. Upper chest: Scarring is noted at the lung apices, with associated calcification at the right lung apex. Calcification is noted at the carotid bifurcations bilaterally. A 1.3 cm hypodensity at the left thyroid lobe is likely benign, given its size. Other: No additional soft tissue abnormalities are seen. IMPRESSION: 1. No evidence of traumatic intracranial injury or fracture. 2. No evidence of fracture or subluxation along the cervical spine. 3. Soft tissue swelling overlying the left parietal calvarium. 4. Moderately severe cortical volume loss and scattered small vessel ischemic microangiopathy. 5. Mild degenerative change along the cervical spine. 6. Scarring at the lung apices, with associated calcification at the right lung apex. 7. Calcification at the carotid bifurcations bilaterally. Carotid ultrasound would be helpful for further evaluation, when and as deemed clinically appropriate. Electronically Signed   By: Roanna RaiderJeffery  Chang M.D.   On: 03/27/2016 02:26   Mr Maxine GlennMra Head Wo Contrast  Result Date: 03/28/2016 CLINICAL DATA:  Initial evaluation for acute stroke. EXAM: MRA HEAD WITHOUT CONTRAST TECHNIQUE: Angiographic images of the Circle of Willis were obtained using MRA technique without intravenous contrast. COMPARISON:  Prior brain MRI from earlier the same day. FINDINGS: ANTERIOR CIRCULATION: Study moderately degraded by motion artifact. Distal cervical segments of the internal carotid arteries are patent with antegrade flow. Petrous, cavernous, and supraclinoid segments patent without flow-limiting stenosis. A1 segments demonstrate atheromatous irregularity without high-grade stenosis. Anterior cerebral arteries grossly patent to their distal aspects without obvious high-grade stenosis. Multifocal atheromatous irregularity suspected. M1 segments patent with probable multifocal atheromatous irregularity without obvious high-grade stenosis on this motion degraded  exam. Distal MCA branches grossly symmetric with multifocal atheromatous irregularity. POSTERIOR CIRCULATION: Dominant left vertebral artery patent to the vertebral basilar junction. Probable short-segment mild to moderate stenosis within the right V4 segment (series 3, image 18). Posterior inferior cerebral arteries patent proximally. Basilar artery widely patent without obvious stenosis on this motion degraded exam. Superior cerebral arteries patent bilaterally. Both of the posterior cerebral arteries primarily supplied via the basilar. Moderate to severe multifocal bilateral P2 stenoses present, and more notable on the right. PCAs are patent to their distal aspects. No aneurysm or vascular malformation. IMPRESSION: 1. Moderately motion degraded study. 2. No proximal or large vessel occlusion. No correctable stenosis identified. 3. Moderate atherosclerotic changes involving the intracranial circulation as above, most notable within the PCAs bilaterally. Electronically Signed   By: Rise MuBenjamin  McClintock M.D.   On: 03/28/2016 18:45   Mr Brain Wo Contrast  Result Date: 03/28/2016 CLINICAL DATA:  Acute onset of dizziness with fall and hip fracture. EXAM: MRI HEAD WITHOUT CONTRAST MRI CERVICAL SPINE WITHOUT CONTRAST TECHNIQUE: Multiplanar, multiecho pulse sequences of the brain and surrounding structures, and cervical spine, to include the craniocervical junction and cervicothoracic junction, were obtained without intravenous contrast. COMPARISON:  CT 03/27/2017 FINDINGS: MRI HEAD FINDINGS Brain: 1 x 2 cm acute infarction in the right parietal deep white matter. No other acute infarction. The brainstem and cerebellum are unremarkable. Cerebral hemispheres elsewhere show generalized atrophy with confluent chronic small vessel disease of the white matter. No mass lesion, hemorrhage, hydrocephalus or extra-axial collection. Vascular: Major vessels at the base of the brain show flow. Skull and upper cervical spine:  Negative Sinuses/Orbits: Clear/normal Other: None MRI  CERVICAL SPINE FINDINGS Alignment: Normal Vertebrae: No fracture or primary bone lesion. Cord: No cord compression or primary cord lesion. Posterior Fossa, vertebral arteries, paraspinal tissues: Negative Disc levels: Foramen magnum, C1-2 and C2-3:  Normal. C3-4:  Disc bulge.  No stenosis or neural compression. C4-5: Endplate osteophytes and bulging of the disc. Narrowing of the subarachnoid space. AP diameter of the canal measures 8 mm. Bilateral neural foraminal stenosis. C5-6: Endplate osteophytes and bulging of the disc. Effacement of the subarachnoid space. AP diameter of the canal measures 8 mm. Bilateral foraminal stenosis. C6-7: Spondylosis with endplate osteophytes and bulging of the disc. Narrowing the ventral subarachnoid space. Mild foraminal stenosis bilaterally left more than right. C7-T1:  Normal interspace. Upper thoracic region unremarkable. IMPRESSION: MRI head: 1 x 2 cm acute infarction in the right parietal deep white matter. Atrophy and extensive chronic small vessel ischemic changes elsewhere. MRI cervical spine: No acute or traumatic finding. Mid cervical spondylosis. Spinal stenosis at C4-5, C5-6 and C6-7 with effacement of the subarachnoid space but no actual cord compression. Foraminal stenosis bilaterally at those levels that could affect the C5, C6 or C7 nerve roots. Electronically Signed   By: Paulina Fusi M.D.   On: 03/28/2016 12:53   Mr Cervical Spine Wo Contrast  Result Date: 03/28/2016 CLINICAL DATA:  Acute onset of dizziness with fall and hip fracture. EXAM: MRI HEAD WITHOUT CONTRAST MRI CERVICAL SPINE WITHOUT CONTRAST TECHNIQUE: Multiplanar, multiecho pulse sequences of the brain and surrounding structures, and cervical spine, to include the craniocervical junction and cervicothoracic junction, were obtained without intravenous contrast. COMPARISON:  CT 03/27/2017 FINDINGS: MRI HEAD FINDINGS Brain: 1 x 2 cm acute infarction  in the right parietal deep white matter. No other acute infarction. The brainstem and cerebellum are unremarkable. Cerebral hemispheres elsewhere show generalized atrophy with confluent chronic small vessel disease of the white matter. No mass lesion, hemorrhage, hydrocephalus or extra-axial collection. Vascular: Major vessels at the base of the brain show flow. Skull and upper cervical spine: Negative Sinuses/Orbits: Clear/normal Other: None MRI CERVICAL SPINE FINDINGS Alignment: Normal Vertebrae: No fracture or primary bone lesion. Cord: No cord compression or primary cord lesion. Posterior Fossa, vertebral arteries, paraspinal tissues: Negative Disc levels: Foramen magnum, C1-2 and C2-3:  Normal. C3-4:  Disc bulge.  No stenosis or neural compression. C4-5: Endplate osteophytes and bulging of the disc. Narrowing of the subarachnoid space. AP diameter of the canal measures 8 mm. Bilateral neural foraminal stenosis. C5-6: Endplate osteophytes and bulging of the disc. Effacement of the subarachnoid space. AP diameter of the canal measures 8 mm. Bilateral foraminal stenosis. C6-7: Spondylosis with endplate osteophytes and bulging of the disc. Narrowing the ventral subarachnoid space. Mild foraminal stenosis bilaterally left more than right. C7-T1:  Normal interspace. Upper thoracic region unremarkable. IMPRESSION: MRI head: 1 x 2 cm acute infarction in the right parietal deep white matter. Atrophy and extensive chronic small vessel ischemic changes elsewhere. MRI cervical spine: No acute or traumatic finding. Mid cervical spondylosis. Spinal stenosis at C4-5, C5-6 and C6-7 with effacement of the subarachnoid space but no actual cord compression. Foraminal stenosis bilaterally at those levels that could affect the C5, C6 or C7 nerve roots. Electronically Signed   By: Paulina Fusi M.D.   On: 03/28/2016 12:53   Pelvis Portable  Result Date: 03/27/2016 CLINICAL DATA:  Post LEFT hip replacement EXAM: PORTABLE PELVIS  1-2 VIEWS COMPARISON:  Portable exam 1222 hours compared to preoperative images of 03/27/2016 FINDINGS: LEFT hip prosthesis in expected position. Bones  demineralized. No acute fracture, dislocation, or bone destruction. IM nail with compression screw proximal RIGHT femur post ORIF of an intertrochanteric fracture. IMPRESSION: Interval resection of the LEFT femoral head and placement of a LEFT hip prosthesis. No acute abnormalities. Electronically Signed   By: Ulyses Southward M.D.   On: 03/27/2016 13:08   Dg Hip Unilat W Or Wo Pelvis 2-3 Views Left  Result Date: 03/27/2016 CLINICAL DATA:  Status post fall, with left hip pain. Initial encounter. EXAM: DG HIP (WITH OR WITHOUT PELVIS) 2-3V LEFT COMPARISON:  None. FINDINGS: There appears to be a transcervical fracture through the left femoral neck, with superior displacement of the distal femur. The right femoral head remains seated at the acetabulum. Right femoral hardware is grossly unremarkable, though incompletely imaged. Mild degenerative change is noted along the lower lumbar spine. The sacroiliac joints are grossly unremarkable. The visualized bowel gas pattern is within normal limits. IMPRESSION: Apparent transcervical fracture through the left femoral neck, with superior displacement of the distal femur. Electronically Signed   By: Roanna Raider M.D.   On: 03/27/2016 02:49    DISCHARGE EXAMINATION: Vitals:   03/31/16 0704 03/31/16 1500 03/31/16 2046 04/01/16 0529  BP: 125/62 123/67 (!) 144/68 134/60  Pulse: 82 85 87 71  Resp: 16 16 16 16   Temp: 98.6 F (37 C) 99.1 F (37.3 C) 98.4 F (36.9 C) 97.5 F (36.4 C)  TempSrc: Oral  Oral Oral  SpO2: 98% 94% 98% 96%  Weight:      Height:       Resp: clear to auscultation bilaterally Cardio: regular rate and rhythm, S1, S2 normal, no murmur, click, rub or gallop GI: soft, non-tender; bowel sounds normal; no masses,  no organomegaly Extremities: No bruising noted over the left thigh Strength has  improved and the left shoulder and elbow, but poor strength noted in the wrist and fingers/hand.  DISPOSITION: SNF  Discharge Instructions    Ambulatory referral to Neurology    Complete by:  As directed    An appointment is requested in approximately: 6 weeks for stroke follow up   Call MD for:  difficulty breathing, headache or visual disturbances    Complete by:  As directed    Call MD for:  extreme fatigue    Complete by:  As directed    Call MD for:  persistant dizziness or light-headedness    Complete by:  As directed    Call MD for:  persistant nausea and vomiting    Complete by:  As directed    Call MD for:  severe uncontrolled pain    Complete by:  As directed    Call MD for:  temperature >100.4    Complete by:  As directed    Diet - low sodium heart healthy    Complete by:  As directed    Discharge instructions    Complete by:  As directed    Please see instructions on the discharge summary.   You were cared for by a hospitalist during your hospital stay. If you have any questions about your discharge medications or the care you received while you were in the hospital after you are discharged, you can call the unit and asked to speak with the hospitalist on call if the hospitalist that took care of you is not available. Once you are discharged, your primary care physician will handle any further medical issues. Please note that NO REFILLS for any discharge medications will be authorized once you are  discharged, as it is imperative that you return to your primary care physician (or establish a relationship with a primary care physician if you do not have one) for your aftercare needs so that they can reassess your need for medications and monitor your lab values. If you do not have a primary care physician, you can call [phone] for a physician referral.   Increase activity slowly    Complete by:  As directed    Weight bearing as tolerated    Complete by:  As directed     Laterality:  left   Extremity:  Lower      ALLERGIES:  Allergies  Allergen Reactions  . Penicillins Hives and Rash     Current Discharge Medication List    START taking these medications   Details  acetaminophen (TYLENOL) 325 MG tablet Take 2 tablets (650 mg total) by mouth every 6 (six) hours as needed for mild pain (or Fever >/= 101).    atorvastatin (LIPITOR) 10 MG tablet Take 1 tablet (10 mg total) by mouth daily at 6 PM.    clopidogrel (PLAVIX) 75 MG tablet Take 1 tablet (75 mg total) by mouth daily.    enoxaparin (LOVENOX) 30 MG/0.3ML injection Inject 0.3 mLs (30 mg total) into the skin daily. For 2 weeks. Qty: 0 Syringe    feeding supplement, ENSURE ENLIVE, (ENSURE ENLIVE) LIQD Take 237 mLs by mouth 2 (two) times daily between meals. Qty: 237 mL, Refills: 12    HYDROcodone-acetaminophen (NORCO/VICODIN) 5-325 MG tablet Take 1-2 tablets by mouth every 6 (six) hours as needed for moderate pain. Qty: 15 tablet, Refills: 0    meclizine (ANTIVERT) 12.5 MG tablet Take 1 tablet (12.5 mg total) by mouth 2 (two) times daily as needed for dizziness. Qty: 30 tablet, Refills: 0      CONTINUE these medications which have NOT CHANGED   Details  metoprolol tartrate (LOPRESSOR) 25 MG tablet Take 25 mg by mouth 2 (two) times daily.      STOP taking these medications     aspirin EC 81 MG tablet      clotrimazole (MYCELEX) 10 MG troche           Contact information for follow-up providers    Nadara Mustard, MD Follow up in 2 week(s).   Specialty:  Orthopedic Surgery Contact information: [address] [place] [address] [phone]        Delia Heady, MD. Schedule an appointment as soon as possible for a visit in 6 week(s).   Specialties:  Neurology, Radiology Contact information: [address] [place] [address] [phone]            Contact information for after-discharge care    Destination    [facility]  Follow up.   Specialty:  Skilled Nursing Facility Contact information: [address] [place] [address] [phone]                  TOTAL DISCHARGE TIME: 35 minutes  [facility]  [facility] Pager [phone]  04/01/2016, 7:36 AM

## 2016-04-02 NOTE — Progress Notes (Signed)
Patient could not be discharged as skilled nursing facility paperwork had not been completed.

## 2016-04-10 ENCOUNTER — Inpatient Hospital Stay (INDEPENDENT_AMBULATORY_CARE_PROVIDER_SITE_OTHER): Payer: Medicare Other | Admitting: Orthopedic Surgery

## 2016-04-11 ENCOUNTER — Ambulatory Visit (INDEPENDENT_AMBULATORY_CARE_PROVIDER_SITE_OTHER): Payer: No Typology Code available for payment source

## 2016-04-11 ENCOUNTER — Ambulatory Visit (INDEPENDENT_AMBULATORY_CARE_PROVIDER_SITE_OTHER): Payer: Medicare Other | Admitting: Orthopedic Surgery

## 2016-04-11 ENCOUNTER — Encounter (INDEPENDENT_AMBULATORY_CARE_PROVIDER_SITE_OTHER): Payer: Self-pay | Admitting: Orthopedic Surgery

## 2016-04-11 VITALS — Ht 64.0 in | Wt 110.0 lb

## 2016-04-11 DIAGNOSIS — M25552 Pain in left hip: Secondary | ICD-10-CM

## 2016-04-11 NOTE — Progress Notes (Signed)
Office Visit Note   Patient: Jessica Booth           Date of Birth: Jan 21, 1922           MRN: [id number] Visit Date: 04/11/2016              Requested by: Vivien Presto, MD [phone] [address] [place] [address] PCP: Vivien Presto, MD  Chief Complaint  Patient presents with  . Left Hip - Routine Post Op    03/27/16 left hemiarthroplasty femoral neck fracture      HPI: Patient is 2 weeks postoperative left hip hemiarthroplasty. She did have a stroke patient is having difficulty progressing with physical therapy. Patient is taking her pain medication around-the-clock.  Assessment & Plan: Visit Diagnoses:  1. Pain in left hip     Plan: Recommended weaning off the narcotic pain medication. Discussed that with her strong patient will have difficulty with progression with physical therapy. Discussed that she may not ever walk again but her hip is stable and that she can weight-bear as tolerated. Patient also has some chronic lower back pain and this should resolve as she improves her mobilization.  Follow-Up Instructions: Return if symptoms worsen or fail to improve.   Ortho Exam  Patient is alert, oriented, no adenopathy, well-dressed, normal affect, normal respiratory effort. Examination patient's leg is straight she has no pain with passive range of motion of the hip. Radiographs shows a congruent joint.  Imaging: Xr Hip Unilat W Or W/o Pelvis 2-3 Views Left  Result Date: 04/11/2016 Two-view radiographs of left hip shows a stable left hip hemiarthroplasty. The hip was reduced there is no complicating features leg length is congruent.   Labs: Lab Results  Component Value Date   HGBA1C 5.1 03/29/2016   REPTSTATUS 03/24/2014 FINAL 03/21/2014   CULT  03/21/2014    KLEBSIELLA PNEUMONIAE Performed at [facility]    LABORGA KLEBSIELLA PNEUMONIAE 03/21/2014    Orders:  Orders Placed This Encounter  Procedures  . XR HIP UNILAT W OR W/O PELVIS 2-3  VIEWS LEFT   No orders of the defined types were placed in this encounter.    Procedures: No procedures performed  Clinical Data: No additional findings.  ROS:  All other systems negative, except as noted in the HPI. Review of Systems  Objective: Vital Signs: Ht  (1.626 m)   Wt 110 lb (49.9 kg)   BMI 18.88 kg/m   Specialty Comments:  No specialty comments available.  PMFS History: Patient Active Problem List   Diagnosis Date Noted  . Left arm weakness   . S/P hip hemiarthroplasty   . Stroke (cerebrum) (HCC) 03/28/2016  . LEFT Femoral neck Hip fracture (HCC) 03/27/2016  . Essential hypertension 03/27/2016  . CAD (coronary artery disease) 03/27/2016  . Transcervical fracture of femur, left, closed, initial encounter [facility])    Past Medical History:  Diagnosis Date  . Abnormal liver function test   . Coronary artery disease 2004   stent RCA  . Hyperlipidemia   . Hypertension   . Irritable bowel syndrome     Family History  Problem Relation Age of Onset  . Hypertension Sister     Past Surgical History:  Procedure Laterality Date  . FEMUR FRACTURE SURGERY    . HIP ARTHROPLASTY Left 03/27/2016   Procedure: ARTHROPLASTY BIPOLAR HIP (HEMIARTHROPLASTY);  Surgeon: Nadara Mustard, MD;  Location: [facility] OR;  Service: Orthopedics;  Laterality: Left;   Social History  Occupational History  . Not on file.   Social History Main Topics  . Smoking status: Never Smoker  . Smokeless tobacco: Never Used  . Alcohol use Yes     Comment: beer rarely  . Drug use: Unknown  . Sexual activity: Not on file

## 2016-05-04 ENCOUNTER — Emergency Department (HOSPITAL_COMMUNITY)
Admission: EM | Admit: 2016-05-04 | Discharge: 2016-05-04 | Disposition: A | Discharge status: Home / Self Care | Payer: Medicare Other | Attending: Emergency Medicine | Admitting: Emergency Medicine

## 2016-05-04 ENCOUNTER — Encounter (HOSPITAL_COMMUNITY): Payer: Self-pay

## 2016-05-04 ENCOUNTER — Emergency Department (HOSPITAL_COMMUNITY): Payer: Medicare Other

## 2016-05-04 DIAGNOSIS — Z96642 Presence of left artificial hip joint: Secondary | ICD-10-CM | POA: Insufficient documentation

## 2016-05-04 DIAGNOSIS — W19XXXA Unspecified fall, initial encounter: Secondary | ICD-10-CM | POA: Diagnosis not present

## 2016-05-04 DIAGNOSIS — Z23 Encounter for immunization: Secondary | ICD-10-CM | POA: Insufficient documentation

## 2016-05-04 DIAGNOSIS — Z79899 Other long term (current) drug therapy: Secondary | ICD-10-CM | POA: Insufficient documentation

## 2016-05-04 DIAGNOSIS — Y92129 Unspecified place in nursing home as the place of occurrence of the external cause: Secondary | ICD-10-CM | POA: Diagnosis not present

## 2016-05-04 DIAGNOSIS — S0101XA Laceration without foreign body of scalp, initial encounter: Secondary | ICD-10-CM | POA: Insufficient documentation

## 2016-05-04 DIAGNOSIS — Y9389 Activity, other specified: Secondary | ICD-10-CM | POA: Diagnosis not present

## 2016-05-04 DIAGNOSIS — S0990XA Unspecified injury of head, initial encounter: Secondary | ICD-10-CM | POA: Diagnosis present

## 2016-05-04 DIAGNOSIS — I2510 Atherosclerotic heart disease of native coronary artery without angina pectoris: Secondary | ICD-10-CM | POA: Insufficient documentation

## 2016-05-04 DIAGNOSIS — Y999 Unspecified external cause status: Secondary | ICD-10-CM | POA: Insufficient documentation

## 2016-05-04 DIAGNOSIS — I10 Essential (primary) hypertension: Secondary | ICD-10-CM | POA: Insufficient documentation

## 2016-05-04 DIAGNOSIS — Z8673 Personal history of transient ischemic attack (TIA), and cerebral infarction without residual deficits: Secondary | ICD-10-CM | POA: Insufficient documentation

## 2016-05-04 LAB — BASIC METABOLIC PANEL
ANION GAP: 12 (ref 5–15)
BUN: 19 mg/dL (ref 6–20)
CO2: 20 mmol/L — ABNORMAL LOW (ref 22–32)
Calcium: 9.1 mg/dL (ref 8.9–10.3)
Chloride: 107 mmol/L (ref 101–111)
Creatinine, Ser: 1.07 mg/dL — ABNORMAL HIGH (ref 0.44–1.00)
GFR calc Af Amer: 51 mL/min — ABNORMAL LOW (ref >60)
GFR, EST NON AFRICAN AMERICAN: 44 mL/min — AB (ref >60)
Glucose, Bld: 102 mg/dL — ABNORMAL HIGH (ref 65–99)
Potassium: 3.5 mmol/L (ref 3.5–5.1)
Sodium: 139 mmol/L (ref 135–145)

## 2016-05-04 LAB — CBC WITH DIFFERENTIAL/PLATELET
Basophils Absolute: 0 10*3/uL (ref 0.0–0.1)
Basophils Relative: 0 %
EOS PCT: 1 %
Eosinophils Absolute: 0.1 10*3/uL (ref 0.0–0.7)
HCT: 34.3 % — ABNORMAL LOW (ref 36.0–46.0)
Hemoglobin: 11.7 g/dL — ABNORMAL LOW (ref 12.0–15.0)
LYMPHS ABS: 2.7 10*3/uL (ref 0.7–4.0)
LYMPHS PCT: 30 %
MCH: 32.3 pg (ref 26.0–34.0)
MCHC: 34.1 g/dL (ref 30.0–36.0)
MCV: 94.8 fL (ref 78.0–100.0)
MONO ABS: 1 10*3/uL (ref 0.1–1.0)
Monocytes Relative: 11 %
Neutro Abs: 5.3 10*3/uL (ref 1.7–7.7)
Neutrophils Relative %: 58 %
PLATELETS: 257 10*3/uL (ref 150–400)
RBC: 3.62 MIL/uL — ABNORMAL LOW (ref 3.87–5.11)
RDW: 12.5 % (ref 11.5–15.5)
WBC: 9 10*3/uL (ref 4.0–10.5)

## 2016-05-04 LAB — TROPONIN I: Troponin I: <0.03 ng/mL (ref <0.03)

## 2016-05-04 MED ORDER — LIDOCAINE-EPINEPHRINE (PF) 2 %-1:200000 IJ SOLN
20.0000 mL | Freq: Once | INTRAMUSCULAR | Status: DC
  Filled 2016-05-04: qty 20

## 2016-05-04 MED ORDER — TETANUS-DIPHTH-ACELL PERTUSSIS 5-2.5-18.5 LF-MCG/0.5 IM SUSP
0.5000 mL | Freq: Once | INTRAMUSCULAR | Status: AC
  Administered 2016-05-04: 0.5 mL via INTRAMUSCULAR
  Filled 2016-05-04: qty 0.5

## 2016-05-04 NOTE — ED Notes (Signed)
[facility] called for transport back to facility 

## 2016-05-04 NOTE — ED Triage Notes (Signed)
Patient is on Plavix and has previous stroke history

## 2016-05-04 NOTE — ED Triage Notes (Signed)
Patient is from [facility], here for a fall that happened at 2000 on 05/03/16.  Had nickel sized hematoma to the posterior head.  Bandages were changed multiple times by facility as they kept bleeding through.  Fall was unwitnessed and patient does not remember the fall or events leading to the fall.  Patient is A&Ox4 at this time.

## 2016-05-04 NOTE — ED Provider Notes (Signed)
[facility]-EMERGENCY DEPT Provider Note   CSN: [id number] Arrival date & time: 05/04/16  0209  By signing my name below, I, Freida Busman, attest that this documentation has been prepared under the direction and in the presence of Pricilla Loveless, MD . Electronically Signed: Freida Busman, Scribe. 05/04/2016. 2:40 AM.  History   Chief Complaint Chief Complaint  Patient presents with  . Fall  . Loss of Consciousness     The history is provided by the patient and the nursing home. No language interpreter was used.   HPI Comments:  Jessica Booth is a 83 y.o. female with a history of CVA, who presents to the Emergency Department s/p unwitnessed fall yesterday (05/03/2016) ~2000. Pt is unsure how she fell, believes she "blacked out" as she woke up on the floor. She is complaining of moderate, constant pain to the back of the head. Pt was sent to the ED via EMS from NH for evaluation of hematoma to the posterior head following the incident.  Pt is on plavix per chart review. Pt denies CP, SOB, and vomiting.   Past Medical History:  Diagnosis Date  . Abnormal liver function test   . Coronary artery disease 2004   stent RCA  . Hyperlipidemia   . Hypertension   . Irritable bowel syndrome     Patient Active Problem List   Diagnosis Date Noted  . Left arm weakness   . S/P hip hemiarthroplasty   . Stroke (cerebrum) (HCC) 03/28/2016  . LEFT Femoral neck Hip fracture (HCC) 03/27/2016  . Essential hypertension 03/27/2016  . CAD (coronary artery disease) 03/27/2016  . Transcervical fracture of femur, left, closed, initial encounter [facility])     Past Surgical History:  Procedure Laterality Date  . FEMUR FRACTURE SURGERY    . HIP ARTHROPLASTY Left 03/27/2016   Procedure: ARTHROPLASTY BIPOLAR HIP (HEMIARTHROPLASTY);  Surgeon: Nadara Mustard, MD;  Location: [facility] OR;  Service: Orthopedics;  Laterality: Left;    OB History    No data available       Home Medications    Prior to Admission  medications   Medication Sig Start Date End Date Taking? Authorizing Provider  acetaminophen (TYLENOL) 325 MG tablet Take 2 tablets (650 mg total) by mouth every 6 (six) hours as needed for mild pain (or Fever >/= 101). 03/30/16   Osvaldo Shipper, MD  atorvastatin (LIPITOR) 10 MG tablet Take 1 tablet (10 mg total) by mouth daily at 6 PM. 03/30/16   Osvaldo Shipper, MD  clopidogrel (PLAVIX) 75 MG tablet Take 1 tablet (75 mg total) by mouth daily. 03/30/16   Osvaldo Shipper, MD  enoxaparin (LOVENOX) 30 MG/0.3ML injection Inject 0.3 mLs (30 mg total) into the skin daily. For 2 weeks. 03/30/16 04/13/16  Osvaldo Shipper, MD  feeding supplement, ENSURE ENLIVE, (ENSURE ENLIVE) LIQD Take 237 mLs by mouth 2 (two) times daily between meals. 03/30/16   Osvaldo Shipper, MD  HYDROcodone-acetaminophen (NORCO/VICODIN) 5-325 MG tablet Take 1-2 tablets by mouth every 6 (six) hours as needed for moderate pain. 03/30/16   Osvaldo Shipper, MD  meclizine (ANTIVERT) 12.5 MG tablet Take 1 tablet (12.5 mg total) by mouth 2 (two) times daily as needed for dizziness. 03/30/16   Osvaldo Shipper, MD  metoprolol tartrate (LOPRESSOR) 25 MG tablet Take 25 mg by mouth 2 (two) times daily.    Historical Provider, MD    Family History Family History  Problem Relation Age of Onset  . Hypertension Sister     Social History Social  History  Substance Use Topics  . Smoking status: Never Smoker  . Smokeless tobacco: Never Used  . Alcohol use Yes     Comment: beer rarely     Allergies   Penicillins   Review of Systems Review of Systems  Respiratory: Negative for shortness of breath.   Cardiovascular: Negative for chest pain.  Gastrointestinal: Negative for vomiting.  Skin: Positive for wound.  Neurological: Positive for syncope and headaches.  All other systems reviewed and are negative.    Physical Exam Updated Vital Signs BP (!) 153/81   Pulse 64   Temp 98.4 F (36.9 C) (Oral)   Resp 20   Ht  (1.6 m)   Wt 110 lb  (49.9 kg)   SpO2 100%   BMI 19.49 kg/m   Physical Exam  Constitutional: She appears well-developed and well-nourished.  HENT:  Head: Normocephalic.  Right Ear: External ear normal.  Left Ear: External ear normal.  Nose: Nose normal.  Posterior scalp swelling and hematoma with matted blood; no current bleeding   Eyes: Right eye exhibits no discharge. Left eye exhibits no discharge.  Cardiovascular: Normal rate, regular rhythm and normal heart sounds.   Pulmonary/Chest: Effort normal and breath sounds normal.  Abdominal: Soft. There is no tenderness.  Musculoskeletal:  No extremity trauma or tenderness  Neurological: She is alert.  5/5 strength in RUE, BLE 4/4 strength LUE    Skin: Skin is warm and dry.  Nursing note and vitals reviewed.    ED Treatments / Results  DIAGNOSTIC STUDIES:  Oxygen Saturation is 99% on RA, normal by my interpretation.    COORDINATION OF CARE:  2:39 AM Discussed treatment plan with pt at bedside and pt agreed to plan.  Labs (all labs ordered are listed, but only abnormal results are displayed) Labs Reviewed  BASIC METABOLIC PANEL - Abnormal; Notable for the following:       Result Value   CO2 20 (*)    Glucose, Bld 102 (*)    Creatinine, Ser 1.07 (*)    GFR calc non Af Amer 44 (*)    GFR calc Af Amer 51 (*)    All other components within normal limits  CBC WITH DIFFERENTIAL/PLATELET - Abnormal; Notable for the following:    RBC 3.62 (*)    Hemoglobin 11.7 (*)    HCT 34.3 (*)    All other components within normal limits  TROPONIN I    EKG  EKG Interpretation  Date/Time:  Saturday May 04 2016 02:25:50 EDT Ventricular Rate:  74 PR Interval:    QRS Duration: 90 QT Interval:  417 QTC Calculation: 463 R Axis:   34 Text Interpretation:  Sinus rhythm Low voltage, precordial leads no significant change since Mar 2018 Confirmed by Criss Alvine MD, Alias Villagran [id number]) on 05/04/2016 2:27:53 AM       Radiology Ct Head Wo Contrast  Result  Date: 05/04/2016 CLINICAL DATA:  83 year old female with fall and laceration of the scalp. EXAM: CT HEAD WITHOUT CONTRAST CT CERVICAL SPINE WITHOUT CONTRAST TECHNIQUE: Multidetector CT imaging of the head and cervical spine was performed following the standard protocol without intravenous contrast. Multiplanar CT image reconstructions of the cervical spine were also generated. COMPARISON:  Head CT dated 03/27/2016 and MRI angiography dated 03/28/2016 FINDINGS: CT HEAD FINDINGS Brain: There is moderate-to-severe age-related atrophy and chronic microvascular ischemic changes. There is no acute intracranial hemorrhage. No mass effect or midline shift noted. No extra-axial fluid collections. Vascular: No hyperdense vessel or unexpected calcification.  Skull: Normal. Negative for fracture or focal lesion. Sinuses/Orbits: Mild mucoperiosteal thickening of paranasal sinuses. No air-fluid levels. The mastoid air cells are clear. Small right frontal sinus osteoma. Other: There is scalp hematoma over the vertex. CT CERVICAL SPINE FINDINGS Alignment: No acute subluxation. Skull base and vertebrae: No acute fracture. No primary bone lesion or focal pathologic process. The bones are osteopenic. Soft tissues and spinal canal: No prevertebral fluid or swelling. No visible canal hematoma. Disc levels: Multilevel degenerative changes with endplate irregularity and disc space narrowing and osteophyte most prominent at C4-C5 and C5-C6. There is a grade 1 C5-C6 retrolisthesis. Upper chest: Calcified right apical pleural plaque Other: Atherosclerotic calcification of the carotid bulb. IMPRESSION: 1. No acute intracranial hemorrhage. Moderate to severe age-related atrophy and chronic microvascular ischemic changes. 2. No acute/traumatic cervical spine pathology. Degenerative changes. Electronically Signed   By: Elgie Collard M.D.   On: 05/04/2016 04:19   Ct Cervical Spine Wo Contrast  Result Date: 05/04/2016 CLINICAL DATA:   83 year old female with fall and laceration of the scalp. EXAM: CT HEAD WITHOUT CONTRAST CT CERVICAL SPINE WITHOUT CONTRAST TECHNIQUE: Multidetector CT imaging of the head and cervical spine was performed following the standard protocol without intravenous contrast. Multiplanar CT image reconstructions of the cervical spine were also generated. COMPARISON:  Head CT dated 03/27/2016 and MRI angiography dated 03/28/2016 FINDINGS: CT HEAD FINDINGS Brain: There is moderate-to-severe age-related atrophy and chronic microvascular ischemic changes. There is no acute intracranial hemorrhage. No mass effect or midline shift noted. No extra-axial fluid collections. Vascular: No hyperdense vessel or unexpected calcification. Skull: Normal. Negative for fracture or focal lesion. Sinuses/Orbits: Mild mucoperiosteal thickening of paranasal sinuses. No air-fluid levels. The mastoid air cells are clear. Small right frontal sinus osteoma. Other: There is scalp hematoma over the vertex. CT CERVICAL SPINE FINDINGS Alignment: No acute subluxation. Skull base and vertebrae: No acute fracture. No primary bone lesion or focal pathologic process. The bones are osteopenic. Soft tissues and spinal canal: No prevertebral fluid or swelling. No visible canal hematoma. Disc levels: Multilevel degenerative changes with endplate irregularity and disc space narrowing and osteophyte most prominent at C4-C5 and C5-C6. There is a grade 1 C5-C6 retrolisthesis. Upper chest: Calcified right apical pleural plaque Other: Atherosclerotic calcification of the carotid bulb. IMPRESSION: 1. No acute intracranial hemorrhage. Moderate to severe age-related atrophy and chronic microvascular ischemic changes. 2. No acute/traumatic cervical spine pathology. Degenerative changes. Electronically Signed   By: Elgie Collard M.D.   On: 05/04/2016 04:19    Procedures .Marland KitchenLaceration Repair Date/Time: 05/04/2016 6:28 AM Performed by: Pricilla Loveless Authorized by:  Pricilla Loveless   Consent:    Consent obtained:  Verbal   Consent given by:  Patient and guardian Anesthesia (see MAR for exact dosages):    Anesthesia method:  Local infiltration   Local anesthetic:  Lidocaine 2% WITH epi Laceration details:    Location:  Scalp   Length (cm):  4 Repair type:    Repair type:  Simple Pre-procedure details:    Preparation:  Patient was prepped and draped in usual sterile fashion and imaging obtained to evaluate for foreign bodies Exploration:    Hemostasis achieved with:  Direct pressure   Contaminated: no   Treatment:    Amount of cleaning:  Standard   Irrigation solution:  Sterile saline Skin repair:    Repair method:  Staples   Number of staples:  4 Approximation:    Approximation:  Close Post-procedure details:    Dressing:  Antibiotic ointment  Patient tolerance of procedure:  Tolerated well, no immediate complications    (including critical care time)  Medications Ordered in ED Medications  Tdap (BOOSTRIX) injection 0.5 mL (0.5 mLs Intramuscular Given 05/04/16 0251)     Initial Impression / Assessment and Plan / ED Course  I have reviewed the triage vital signs and the nursing notes.  Pertinent labs & imaging results that were available during my care of the patient were reviewed by me and considered in my medical decision making (see chart for details).     Patient with an unwitnessed fall at [facility]. Tetanus updated. After wound was cleaned by myself, there is a 4 cm linear posterior scalp lac. Cleaned and repaired as above. Discussed with POA, grandaughter, who after discussion would like patient discharged back to facility. She had a syncope workup last month after fall that was unremarkable (developed CVA during/after hip operation). Given patient/family do not want any aggressive treatments and would not want a pacemaker if needed, I think utility of admission is low. Will d/c back to facility, discussed return precautions.    Final Clinical Impressions(s) / ED Diagnoses   Final diagnoses:  Fall, initial encounter  Laceration of scalp, initial encounter    New Prescriptions Discharge Medication List as of 05/04/2016  6:35 AM     I personally performed the services described in this documentation, which was scribed in my presence. The recorded information has been reviewed and is accurate.    Pricilla Loveless, MD 05/04/16 1053

## 2016-05-14 ENCOUNTER — Ambulatory Visit: Payer: Medicare Other | Admitting: Diagnostic Neuroimaging

## 2016-06-03 ENCOUNTER — Inpatient Hospital Stay (HOSPITAL_COMMUNITY)
Admission: EM | Admit: 2016-06-03 | Discharge: 2016-06-10 | DRG: 467 | Disposition: A | Discharge status: Skilled Nursing Facility | Payer: Medicare Other | Attending: Internal Medicine | Admitting: Internal Medicine

## 2016-06-03 ENCOUNTER — Inpatient Hospital Stay (HOSPITAL_COMMUNITY): Payer: Medicare Other

## 2016-06-03 ENCOUNTER — Emergency Department (HOSPITAL_COMMUNITY): Payer: Medicare Other

## 2016-06-03 ENCOUNTER — Encounter (HOSPITAL_COMMUNITY): Payer: Self-pay | Admitting: Emergency Medicine

## 2016-06-03 DIAGNOSIS — F0390 Unspecified dementia without behavioral disturbance: Secondary | ICD-10-CM | POA: Diagnosis present

## 2016-06-03 DIAGNOSIS — Z7902 Long term (current) use of antithrombotics/antiplatelets: Secondary | ICD-10-CM | POA: Diagnosis not present

## 2016-06-03 DIAGNOSIS — M9702XS Periprosthetic fracture around internal prosthetic left hip joint, sequela: Secondary | ICD-10-CM | POA: Diagnosis not present

## 2016-06-03 DIAGNOSIS — Y92002 Bathroom of unspecified non-institutional (private) residence single-family (private) house as the place of occurrence of the external cause: Secondary | ICD-10-CM

## 2016-06-03 DIAGNOSIS — Z66 Do not resuscitate: Secondary | ICD-10-CM | POA: Diagnosis present

## 2016-06-03 DIAGNOSIS — R29898 Other symptoms and signs involving the musculoskeletal system: Secondary | ICD-10-CM | POA: Diagnosis present

## 2016-06-03 DIAGNOSIS — D62 Acute posthemorrhagic anemia: Secondary | ICD-10-CM | POA: Diagnosis not present

## 2016-06-03 DIAGNOSIS — I69334 Monoplegia of upper limb following cerebral infarction affecting left non-dominant side: Secondary | ICD-10-CM | POA: Diagnosis not present

## 2016-06-03 DIAGNOSIS — I10 Essential (primary) hypertension: Secondary | ICD-10-CM | POA: Diagnosis present

## 2016-06-03 DIAGNOSIS — M80059G Age-related osteoporosis with current pathological fracture, unspecified femur, subsequent encounter for fracture with delayed healing: Secondary | ICD-10-CM | POA: Diagnosis not present

## 2016-06-03 DIAGNOSIS — I639 Cerebral infarction, unspecified: Secondary | ICD-10-CM | POA: Diagnosis present

## 2016-06-03 DIAGNOSIS — E860 Dehydration: Secondary | ICD-10-CM | POA: Diagnosis present

## 2016-06-03 DIAGNOSIS — Z955 Presence of coronary angioplasty implant and graft: Secondary | ICD-10-CM

## 2016-06-03 DIAGNOSIS — M9702XA Periprosthetic fracture around internal prosthetic left hip joint, initial encounter: Secondary | ICD-10-CM | POA: Diagnosis present

## 2016-06-03 DIAGNOSIS — T84021A Dislocation of internal left hip prosthesis, initial encounter: Secondary | ICD-10-CM | POA: Diagnosis present

## 2016-06-03 DIAGNOSIS — M80059A Age-related osteoporosis with current pathological fracture, unspecified femur, initial encounter for fracture: Secondary | ICD-10-CM

## 2016-06-03 DIAGNOSIS — W050XXA Fall from non-moving wheelchair, initial encounter: Secondary | ICD-10-CM | POA: Diagnosis present

## 2016-06-03 DIAGNOSIS — Z8249 Family history of ischemic heart disease and other diseases of the circulatory system: Secondary | ICD-10-CM

## 2016-06-03 DIAGNOSIS — M80059D Age-related osteoporosis with current pathological fracture, unspecified femur, subsequent encounter for fracture with routine healing: Secondary | ICD-10-CM | POA: Diagnosis not present

## 2016-06-03 DIAGNOSIS — I2510 Atherosclerotic heart disease of native coronary artery without angina pectoris: Secondary | ICD-10-CM | POA: Diagnosis present

## 2016-06-03 DIAGNOSIS — M9702XD Periprosthetic fracture around internal prosthetic left hip joint, subsequent encounter: Secondary | ICD-10-CM | POA: Diagnosis not present

## 2016-06-03 DIAGNOSIS — K589 Irritable bowel syndrome without diarrhea: Secondary | ICD-10-CM | POA: Diagnosis present

## 2016-06-03 DIAGNOSIS — Z96649 Presence of unspecified artificial hip joint: Secondary | ICD-10-CM

## 2016-06-03 DIAGNOSIS — M80052G Age-related osteoporosis with current pathological fracture, left femur, subsequent encounter for fracture with delayed healing: Secondary | ICD-10-CM | POA: Diagnosis not present

## 2016-06-03 DIAGNOSIS — E785 Hyperlipidemia, unspecified: Secondary | ICD-10-CM | POA: Diagnosis present

## 2016-06-03 HISTORY — DX: Cerebral infarction, unspecified: I63.9

## 2016-06-03 LAB — URINALYSIS, ROUTINE W REFLEX MICROSCOPIC
Bilirubin Urine: NEGATIVE
Glucose, UA: NEGATIVE mg/dL
KETONES UR: NEGATIVE mg/dL
Nitrite: NEGATIVE
PROTEIN: NEGATIVE mg/dL
Specific Gravity, Urine: <1.005 — ABNORMAL LOW (ref 1.005–1.030)
pH: 6 (ref 5.0–8.0)

## 2016-06-03 LAB — CBC WITH DIFFERENTIAL/PLATELET
Basophils Absolute: 0 10*3/uL (ref 0.0–0.1)
Basophils Relative: 0 %
EOS ABS: 0 10*3/uL (ref 0.0–0.7)
EOS PCT: 0 %
HCT: 38 % (ref 36.0–46.0)
Hemoglobin: 13 g/dL (ref 12.0–15.0)
LYMPHS ABS: 2.6 10*3/uL (ref 0.7–4.0)
Lymphocytes Relative: 24 %
MCH: 32.3 pg (ref 26.0–34.0)
MCHC: 34.2 g/dL (ref 30.0–36.0)
MCV: 94.5 fL (ref 78.0–100.0)
MONO ABS: 0.9 10*3/uL (ref 0.1–1.0)
MONOS PCT: 9 %
Neutro Abs: 7.2 10*3/uL (ref 1.7–7.7)
Neutrophils Relative %: 67 %
Platelets: 228 10*3/uL (ref 150–400)
RBC: 4.02 MIL/uL (ref 3.87–5.11)
RDW: 12.3 % (ref 11.5–15.5)
WBC: 10.7 10*3/uL — AB (ref 4.0–10.5)

## 2016-06-03 LAB — COMPREHENSIVE METABOLIC PANEL
ALT: 25 U/L (ref 14–54)
AST: 35 U/L (ref 15–41)
Albumin: 4.3 g/dL (ref 3.5–5.0)
Alkaline Phosphatase: 64 U/L (ref 38–126)
Anion gap: 11 (ref 5–15)
BUN: 22 mg/dL — ABNORMAL HIGH (ref 6–20)
CHLORIDE: 106 mmol/L (ref 101–111)
CO2: 21 mmol/L — AB (ref 22–32)
Calcium: 9.6 mg/dL (ref 8.9–10.3)
Creatinine, Ser: 1.03 mg/dL — ABNORMAL HIGH (ref 0.44–1.00)
GFR, EST AFRICAN AMERICAN: 53 mL/min — AB (ref >60)
GFR, EST NON AFRICAN AMERICAN: 46 mL/min — AB (ref >60)
Glucose, Bld: 98 mg/dL (ref 65–99)
Potassium: 4.4 mmol/L (ref 3.5–5.1)
SODIUM: 138 mmol/L (ref 135–145)
Total Bilirubin: 0.5 mg/dL (ref 0.3–1.2)
Total Protein: 6.9 g/dL (ref 6.5–8.1)

## 2016-06-03 LAB — URINALYSIS, MICROSCOPIC (REFLEX)

## 2016-06-03 LAB — I-STAT TROPONIN, ED: TROPONIN I, POC: 0 ng/mL (ref 0.00–0.08)

## 2016-06-03 MED ORDER — MORPHINE SULFATE (PF) 4 MG/ML IV SOLN
0.5000 mg | INTRAVENOUS | Status: DC | PRN
  Administered 2016-06-03 – 2016-06-04 (×2): 0.52 mg via INTRAVENOUS
  Filled 2016-06-03 (×2): qty 1

## 2016-06-03 MED ORDER — FENTANYL CITRATE (PF) 100 MCG/2ML IJ SOLN
25.0000 ug | Freq: Once | INTRAMUSCULAR | Status: AC
Start: 2016-06-03 — End: 2016-06-03
  Administered 2016-06-03: 25 ug via INTRAVENOUS
  Filled 2016-06-03: qty 2

## 2016-06-03 MED ORDER — MECLIZINE HCL 12.5 MG PO TABS
12.5000 mg | ORAL_TABLET | Freq: Two times a day (BID) | ORAL | Status: DC | PRN
  Administered 2016-06-04: 12.5 mg via ORAL
  Filled 2016-06-03 (×3): qty 1

## 2016-06-03 MED ORDER — CITALOPRAM HYDROBROMIDE 20 MG PO TABS
20.0000 mg | ORAL_TABLET | Freq: Every day | ORAL | Status: DC
  Administered 2016-06-04 – 2016-06-10 (×7): 20 mg via ORAL
  Filled 2016-06-03 (×7): qty 1

## 2016-06-03 MED ORDER — METOPROLOL TARTRATE 25 MG PO TABS
25.0000 mg | ORAL_TABLET | Freq: Two times a day (BID) | ORAL | Status: DC
  Administered 2016-06-04 – 2016-06-10 (×13): 25 mg via ORAL
  Filled 2016-06-03 (×13): qty 1

## 2016-06-03 MED ORDER — ENSURE ENLIVE PO LIQD
237.0000 mL | Freq: Two times a day (BID) | ORAL | Status: DC
  Administered 2016-06-04 – 2016-06-10 (×6): 237 mL via ORAL

## 2016-06-03 MED ORDER — HYDROCODONE-ACETAMINOPHEN 5-325 MG PO TABS
1.0000 | ORAL_TABLET | Freq: Four times a day (QID) | ORAL | Status: DC | PRN
  Administered 2016-06-04 – 2016-06-05 (×4): 2 via ORAL
  Filled 2016-06-03 (×4): qty 2

## 2016-06-03 MED ORDER — SENNA 8.6 MG PO TABS: 1.0000 | ORAL_TABLET | Freq: Every day | ORAL | Status: DC | PRN

## 2016-06-03 NOTE — Progress Notes (Signed)
Patient ID: Lucia Gaskinsorothy R. Almeda, female   DOB: [birth year], 83 y.o.   MRN: [id number] I have reviewed the patient's left hip x-rays and her CT scan.  She has a non-displaced periprosthetic fracture of the proximal femur around the femoral component.  She will need to remain non-weight bearing on her left hip until further notice.  My partner, Dr. Lajoyce Cornersuda, did her left hip hemiarthroplasty just 2 months ago and she has been in post-op care for this.  I will inform Dr. Lajoyce Cornersuda that she is here.  She can a least have breakfast in the morning tomorrow.  Her family member who is POA is at the bedside and I showed her the x-rays and discussed the plan to her.

## 2016-06-03 NOTE — ED Triage Notes (Signed)
Patient arrived to ED via [facility]. EMS reports: Patient currently resident at [facility] on  [address], [place]. Patient had L hip replacement in March 2018 d/t fall resulting in L hip fx. Patient has been at rehab. Approx 1500, pain experienced L hip pain. She stood to ambulate and fell d/t L hip pain. Reports she had fallen twice today. No LOC. Patient takes Plavix. Hx CVA with L sided deficits, HTN. BP 182/80, Pulse 72, 97% on room air.

## 2016-06-03 NOTE — ED Provider Notes (Signed)
[facility]-EMERGENCY DEPT Provider Note   CSN: [id number] Arrival date & time: 06/03/16  1617     History   Chief Complaint Chief Complaint  Patient presents with  . Fall  . Hip Pain    HPI Seniah Lawrence is a 83 y.o. female.  HPI Patient with left hip replacement in March by Dr. Lajoyce Corners. She's had persistent left hip pain. Patient states that she was sitting on the toilet and stood up. She lost her balance and fell onto her left hip. She is now complaining of worsening pain to the left hip. She's been able to ambulate since the fall. Denies hitting her head or neck. No loss of consciousness. Denies any numbness or tingling. No fever or chills. Denies any urinary symptoms. Past Medical History:  Diagnosis Date  . Abnormal liver function test   . Coronary artery disease 2004   stent RCA  . Hyperlipidemia   . Hypertension   . Irritable bowel syndrome   . Stroke [facility])     Patient Active Problem List   Diagnosis Date Noted  . Periprosthetic fracture around internal prosthetic left hip joint (HCC) 06/03/2016  . Left arm weakness   . S/P hip hemiarthroplasty   . Stroke (cerebrum) (HCC) 03/28/2016  . LEFT Femoral neck Hip fracture (HCC) 03/27/2016  . Essential hypertension 03/27/2016  . CAD (coronary artery disease) 03/27/2016  . Transcervical fracture of femur, left, closed, initial encounter [facility])     Past Surgical History:  Procedure Laterality Date  . FEMUR FRACTURE SURGERY    . HIP ARTHROPLASTY Left 03/27/2016   Procedure: ARTHROPLASTY BIPOLAR HIP (HEMIARTHROPLASTY);  Surgeon: Nadara Mustard, MD;  Location: [facility] OR;  Service: Orthopedics;  Laterality: Left;    OB History    No data available       Home Medications    Prior to Admission medications   Medication Sig Start Date End Date Taking? Authorizing Provider  acetaminophen (TYLENOL) 325 MG tablet Take 2 tablets (650 mg total) by mouth every 6 (six) hours as needed for mild pain (or Fever >/= 101). Patient taking  differently: Take 500 mg by mouth every 6 (six) hours.  03/30/16  Yes Osvaldo Shipper, MD  citalopram (CELEXA) 20 MG tablet Take 20 mg by mouth daily.   Yes [provider]  clopidogrel (PLAVIX) 75 MG tablet Take 1 tablet (75 mg total) by mouth daily. 03/30/16  Yes Osvaldo Shipper, MD  clotrimazole (MYCELEX) 10 MG troche Take 10 mg by mouth daily as needed (yeast).   Yes [provider]  feeding supplement, ENSURE ENLIVE, (ENSURE ENLIVE) LIQD Take 237 mLs by mouth 2 (two) times daily between meals. 03/30/16  Yes Osvaldo Shipper, MD  loperamide (IMODIUM) 2 MG capsule Take 2 mg by mouth every 6 (six) hours as needed for diarrhea or loose stools.   Yes [provider]  meclizine (ANTIVERT) 12.5 MG tablet Take 1 tablet (12.5 mg total) by mouth 2 (two) times daily as needed for dizziness. 03/30/16  Yes Osvaldo Shipper, MD  metoprolol tartrate (LOPRESSOR) 25 MG tablet Take 25 mg by mouth 2 (two) times daily.   Yes [provider]  senna (SENOKOT) 8.6 MG TABS tablet Take 1 tablet by mouth daily as needed for mild constipation.   Yes [provider]  atorvastatin (LIPITOR) 10 MG tablet Take 1 tablet (10 mg total) by mouth daily at 6 PM. Patient not taking: Reported on 06/03/2016 03/30/16   Osvaldo Shipper, MD  enoxaparin (LOVENOX) 30 MG/0.3ML  injection Inject 0.3 mLs (30 mg total) into the skin daily. For 2 weeks. 03/30/16 04/13/16  Osvaldo Shipper, MD  HYDROcodone-acetaminophen (NORCO/VICODIN) 5-325 MG tablet Take 1-2 tablets by mouth every 6 (six) hours as needed for moderate pain. Patient not taking: Reported on 06/03/2016 03/30/16   Osvaldo Shipper, MD    Family History Family History  Problem Relation Age of Onset  . Hypertension Sister     Social History Social History  Substance Use Topics  . Smoking status: Never Smoker  . Smokeless tobacco: Never Used  . Alcohol use Yes     Comment: beer rarely     Allergies   Penicillins   Review of  Systems Review of Systems  Constitutional: Negative for chills and fever.  Respiratory: Negative for cough and shortness of breath.   Cardiovascular: Negative for chest pain, palpitations and leg swelling.  Gastrointestinal: Negative for abdominal pain, constipation, diarrhea, nausea and vomiting.  Genitourinary: Negative for dysuria, flank pain, frequency and hematuria.  Musculoskeletal: Positive for arthralgias, gait problem and neck pain. Negative for back pain and myalgias.  Skin: Negative for rash and wound.  Neurological: Negative for dizziness, syncope, weakness, light-headedness, numbness and headaches.  All other systems reviewed and are negative.    Physical Exam Updated Vital Signs BP (!) 158/66   Pulse 60   Temp 98.5 F (36.9 C) (Oral)   Resp 15   Ht 5\' 3"  (1.6 m)   Wt 52.2 kg (115 lb)   SpO2 98%   BMI 20.37 kg/m   Physical Exam  Constitutional: She is oriented to person, place, and time. She appears well-developed and well-nourished. No distress.  HENT:  Head: Normocephalic and atraumatic.  Mouth/Throat: Oropharynx is clear and moist. No oropharyngeal exudate.  Eyes: EOM are normal. Pupils are equal, round, and reactive to light.  Neck: Normal range of motion. Neck supple.  Diffuse posterior cervical tenderness to palpation. There is no obvious step offs or focal tenderness  Cardiovascular: Normal rate and regular rhythm.  Exam reveals no gallop and no friction rub.   No murmur heard. Pulmonary/Chest: Effort normal and breath sounds normal. No respiratory distress. She has no wheezes. She has no rales. She exhibits no tenderness.  Abdominal: Soft. Bowel sounds are normal. There is no tenderness. There is no rebound and no guarding.  Musculoskeletal: She exhibits no edema or tenderness.  Decreased range of motion the left hip due to pain. Left lower extremity appears to be shortened compared to right. Distal pulses intact.  Neurological: She is alert and oriented  to person, place, and time.  Mildly diminished grip strength and motor to the left lower and upper extremities compared to right. Sensation intact. Patient states she has left-sided deficits due to previous stroke. These are unchanged.  Skin: Skin is warm and dry. Capillary refill takes less than 2 seconds. No rash noted. She is not diaphoretic. No erythema.  Psychiatric: She has a normal mood and affect. Her behavior is normal.  Nursing note and vitals reviewed.    ED Treatments / Results  Labs (all labs ordered are listed, but only abnormal results are displayed) Labs Reviewed  CBC WITH DIFFERENTIAL/PLATELET - Abnormal; Notable for the following:       Result Value   WBC 10.7 (*)    All other components within normal limits  COMPREHENSIVE METABOLIC PANEL - Abnormal; Notable for the following:    CO2 21 (*)    BUN 22 (*)    Creatinine, Ser 1.03 (*)  GFR calc non Af Amer 46 (*)    GFR calc Af Amer 53 (*)    All other components within normal limits  URINALYSIS, ROUTINE W REFLEX MICROSCOPIC - Abnormal; Notable for the following:    Specific Gravity, Urine <1.005 (*)    Hgb urine dipstick TRACE (*)    Leukocytes, UA TRACE (*)    All other components within normal limits  URINALYSIS, MICROSCOPIC (REFLEX) - Abnormal; Notable for the following:    Bacteria, UA RARE (*)    Squamous Epithelial / LPF 0-5 (*)    All other components within normal limits  I-STAT TROPOININ, ED    EKG  EKG Interpretation None       Radiology Ct Head Wo Contrast  Result Date: 06/03/2016 CLINICAL DATA:  Patient fell without syncope. EXAM: CT HEAD WITHOUT CONTRAST CT CERVICAL SPINE WITHOUT CONTRAST TECHNIQUE: Multidetector CT imaging of the head and cervical spine was performed following the standard protocol without intravenous contrast. Multiplanar CT image reconstructions of the cervical spine were also generated. COMPARISON:  05/04/2016 FINDINGS: CT HEAD FINDINGS Brain: Moderate degree of  superficial and central atrophy appears chronic and stable with moderate-to-marked periventricular, subcortical and deep white matter are areas of hypodensity consistent with chronic microvascular ischemia. No acute intracranial hemorrhage. No intra-axial mass nor extra-axial fluid collections. Idiopathic basal ganglial calcifications are noted bilaterally. No effacement of the basal cisterns. The fourth ventricle is midline. There is no hydrocephalus. Vascular: No hyperdense vessel or unexpected calcification. Skull: Negative for fracture. Small osteoma in the right frontal sinus. Sinuses/Orbits: The mastoids are clear. Moderate circumferential mucosal thickening is seen of the sphenoid sinus on the left. Mild mucosal thickening of the ethmoid sinus. Intact orbits and globes with bilateral lens replacements surgeries. Other: None CT CERVICAL SPINE FINDINGS Alignment: Normal cervical lordosis. Skull base and vertebrae: No acute fracture of the vertebral bodies or primary bone lesion. Soft tissues and spinal canal: No prevertebral soft tissue swelling. No visible canal hematoma. Disc levels: Moderate disc space narrowing C4 through C7 with small posterior marginal osteophytes contributing to mild to moderate bilateral neural foraminal encroachment at C4-5 and C5-6. Upper chest: Right apical calcified pleural plaque. Other:  Atherosclerosis of the extracranial carotids. IMPRESSION: 1. Cerebral atrophy with chronic moderate to marked small vessel ischemic disease. No acute intracranial abnormality. 2. Cervical spondylosis without acute posttraumatic fracture nor subluxation. Electronically Signed   By: Tollie Eth M.D.   On: 06/03/2016 17:41   Ct Cervical Spine Wo Contrast  Result Date: 06/03/2016 CLINICAL DATA:  Patient fell without syncope. EXAM: CT HEAD WITHOUT CONTRAST CT CERVICAL SPINE WITHOUT CONTRAST TECHNIQUE: Multidetector CT imaging of the head and cervical spine was performed following the standard  protocol without intravenous contrast. Multiplanar CT image reconstructions of the cervical spine were also generated. COMPARISON:  05/04/2016 FINDINGS: CT HEAD FINDINGS Brain: Moderate degree of superficial and central atrophy appears chronic and stable with moderate-to-marked periventricular, subcortical and deep white matter are areas of hypodensity consistent with chronic microvascular ischemia. No acute intracranial hemorrhage. No intra-axial mass nor extra-axial fluid collections. Idiopathic basal ganglial calcifications are noted bilaterally. No effacement of the basal cisterns. The fourth ventricle is midline. There is no hydrocephalus. Vascular: No hyperdense vessel or unexpected calcification. Skull: Negative for fracture. Small osteoma in the right frontal sinus. Sinuses/Orbits: The mastoids are clear. Moderate circumferential mucosal thickening is seen of the sphenoid sinus on the left. Mild mucosal thickening of the ethmoid sinus. Intact orbits and globes with bilateral lens replacements surgeries.  Other: None CT CERVICAL SPINE FINDINGS Alignment: Normal cervical lordosis. Skull base and vertebrae: No acute fracture of the vertebral bodies or primary bone lesion. Soft tissues and spinal canal: No prevertebral soft tissue swelling. No visible canal hematoma. Disc levels: Moderate disc space narrowing C4 through C7 with small posterior marginal osteophytes contributing to mild to moderate bilateral neural foraminal encroachment at C4-5 and C5-6. Upper chest: Right apical calcified pleural plaque. Other:  Atherosclerosis of the extracranial carotids. IMPRESSION: 1. Cerebral atrophy with chronic moderate to marked small vessel ischemic disease. No acute intracranial abnormality. 2. Cervical spondylosis without acute posttraumatic fracture nor subluxation. Electronically Signed   By: Tollie Ethavid  Kwon M.D.   On: 06/03/2016 17:41   Ct Hip Left Wo Contrast  Result Date: 06/03/2016 CLINICAL DATA:  Left hip pain.  EXAM: CT OF THE LEFT HIP WITHOUT CONTRAST TECHNIQUE: Multidetector CT imaging of the left hip was performed according to the standard protocol. Multiplanar CT image reconstructions were also generated. COMPARISON:  Radiographs of same day. FINDINGS: Status post left hip arthroplasty. Nondisplaced periprosthetic fracture is noted along the medial cortex of the femur in the trochanteric and subtrochanteric regions. No soft tissue abnormality is noted. IMPRESSION: Nondisplaced periprosthetic fracture is noted along the medial cortex of the femur in the trochanteric and subtrochanteric regions. Electronically Signed   By: Lupita RaiderJames  Green Jr, M.D.   On: 06/03/2016 20:42   Dg Hip Unilat W Or Wo Pelvis 2-3 Views Left  Result Date: 06/03/2016 CLINICAL DATA:  Pain after fall EXAM: DG HIP (WITH OR WITHOUT PELVIS) 2-3V LEFT COMPARISON:  03/27/2016 pelvic and preoperative views of the left hip. FINDINGS: On the frog-leg view of the left hip, there is a nondisplaced fracture lucency along the medial aspect of the sub trochanteric left femur not apparent on the prior comparison studies and therefore suspicious for a new fracture. No joint dislocations are identified. The bony pelvis appears intact. Partially imaged right femoral nail appears intact. There is lower lumbar degenerative disc disease at L4-5 and L5-S1 with associated facet arthropathy. IMPRESSION: 1. Nondisplaced fracture lucency is seen on the frog-leg view of the left hip along the medial subtrochanteric portion of the femur. This is not apparent on the prior comparison studies and is suspicious for new finding. 2. No acute pelvic fracture or joint dislocations. Electronically Signed   By: Tollie Ethavid  Kwon M.D.   On: 06/03/2016 18:11    Procedures Procedures (including critical care time)  Medications Ordered in ED Medications  fentaNYL (SUBLIMAZE) injection 25 mcg (25 mcg Intravenous Given 06/03/16 1917)     Initial Impression / Assessment and Plan / ED  Course  I have reviewed the triage vital signs and the nursing notes.  Pertinent labs & imaging results that were available during my care of the patient were reviewed by me and considered in my medical decision making (see chart for details).   Discussed findings with Dr. Magnus IvanBlackman who will see the patient in the emergency department. Asked to have CT hip obtained.  Spoke with Dr. Ophelia CharterYates and she will see the patient and admit.    Final Clinical Impressions(s) / ED Diagnoses   Final diagnoses:  Periprosthetic fracture around internal prosthetic left hip joint, initial encounter [facility](HCC)    New Prescriptions New Prescriptions   No medications on file     Loren RacerYelverton, Saqib Cazarez, MD 06/03/16 2128

## 2016-06-03 NOTE — H&P (Signed)
History and Physical    Jessica Booth:[id number] DOB: 1922-06-11 DOA: 06/03/2016  PCP: Vivien Presto, MD Consultants:  Lajoyce Corners - orthopedics Patient coming from: Assisted Living; NOK: Marylene Land, daughter-in-law  Chief Complaint: fall   HPI: Jessica Booth is a 83 y.o. female with medical history significant of coronary artery disease status post stenting, hypertension, and left hip fracture in 3/17 requiring left hip hemiarthroplasty.  She fell today and landed again on her left hip.  She was doing "practically nothing."  Sitting in a wheelchair, stood up, and fell.  Landed on left hip with immediate pain.  She felt fine prior to the fall, not dizzy or light-headed.  "I just fell, like I did before."  Now with pain in left leg, excruciating when she tries to move.   ED Course:  Periprosthetic hip fracture on x-ray.  Dr. Magnus Ivan saw the patient and recommends NPO after breakfast tomorrow, likely need for surgical intervention late in the day.  He also requested a hip CT.  Review of Systems: As per HPI; otherwise review of systems reviewed and negative.   Ambulatory Status:  Primarily used wheelchair since last fall; could stand, dress herself, toilet herself  Past Medical History:  Diagnosis Date  . Abnormal liver function test   . Coronary artery disease 2004   stent RCA  . Hyperlipidemia   . Hypertension   . Irritable bowel syndrome   . Stroke [facility])     Past Surgical History:  Procedure Laterality Date  . FEMUR FRACTURE SURGERY    . HIP ARTHROPLASTY Left 03/27/2016   Procedure: ARTHROPLASTY BIPOLAR HIP (HEMIARTHROPLASTY);  Surgeon: Nadara Mustard, MD;  Location: [facility] OR;  Service: Orthopedics;  Laterality: Left;    Social History   Social History  . Marital status: Married    Spouse name: N/A  . Number of children: N/A  . Years of education: N/A   Occupational History  . retired    Social History Main Topics  . Smoking status: Never Smoker  . Smokeless tobacco:  Never Used  . Alcohol use Yes     Comment: beer rarely  . Drug use: Unknown  . Sexual activity: Not on file   Other Topics Concern  . Not on file   Social History Narrative  . No narrative on file    Allergies  Allergen Reactions  . Penicillins Hives and Rash    Family History  Problem Relation Age of Onset  . Hypertension Sister     Prior to Admission medications   Medication Sig Start Date End Date Taking? Authorizing Provider  acetaminophen (TYLENOL) 325 MG tablet Take 2 tablets (650 mg total) by mouth every 6 (six) hours as needed for mild pain (or Fever >/= 101). Patient taking differently: Take 500 mg by mouth every 6 (six) hours.  03/30/16  Yes Osvaldo Shipper, MD  citalopram (CELEXA) 20 MG tablet Take 20 mg by mouth daily.   Yes [provider]  clopidogrel (PLAVIX) 75 MG tablet Take 1 tablet (75 mg total) by mouth daily. 03/30/16  Yes Osvaldo Shipper, MD  clotrimazole (MYCELEX) 10 MG troche Take 10 mg by mouth daily as needed (yeast).   Yes [provider]  feeding supplement, ENSURE ENLIVE, (ENSURE ENLIVE) LIQD Take 237 mLs by mouth 2 (two) times daily between meals. 03/30/16  Yes Osvaldo Shipper, MD  loperamide (IMODIUM) 2 MG capsule Take 2 mg by mouth every 6 (six) hours as needed for diarrhea or loose stools.  Yes [provider]  meclizine (ANTIVERT) 12.5 MG tablet Take 1 tablet (12.5 mg total) by mouth 2 (two) times daily as needed for dizziness. 03/30/16  Yes Osvaldo Shipper, MD  metoprolol tartrate (LOPRESSOR) 25 MG tablet Take 25 mg by mouth 2 (two) times daily.   Yes [provider]  senna (SENOKOT) 8.6 MG TABS tablet Take 1 tablet by mouth daily as needed for mild constipation.   Yes [provider]  atorvastatin (LIPITOR) 10 MG tablet Take 1 tablet (10 mg total) by mouth daily at 6 PM. Patient not taking: Reported on 06/03/2016 03/30/16   Osvaldo Shipper, MD  enoxaparin (LOVENOX) 30 MG/0.3ML injection Inject 0.3 mLs (30  mg total) into the skin daily. For 2 weeks. 03/30/16 04/13/16  Osvaldo Shipper, MD  HYDROcodone-acetaminophen (NORCO/VICODIN) 5-325 MG tablet Take 1-2 tablets by mouth every 6 (six) hours as needed for moderate pain. Patient not taking: Reported on 06/03/2016 03/30/16   Osvaldo Shipper, MD    Physical Exam: Vitals:   06/03/16 2045 06/03/16 2100 06/03/16 2115 06/03/16 2130  BP: (!) 158/66 (!) 142/63 (!) 157/65 (!) 159/65  Pulse: 60 (!) 59 62 62  Resp: 15 15 14 15   Temp:      TempSrc:      SpO2: 98% 96% 96% 98%  Weight:      Height:         General:  Appears calm and comfortable and is NAD; winces and cries out when she tries to move the left hip Eyes:   EOMI, normal lids, iris ENT:  grossly normal hearing, lips & tongue, mmm Neck:  no LAD, masses or thyromegaly Cardiovascular:  RRR, mild bradycardia, no m/r/g. No LE edema.  Respiratory:  CTA bilaterally, no w/r/r. Normal respiratory effort. Abdomen:  soft, ntnd, NABS Skin:  no rash or induration seen on limited exam Musculoskeletal:  grossly normal tone BUE/BLE with mild weakness of LUE following previous CVA, good ROM, no bony abnormality; well-healed left surgical scar on lateral hip, with mild ecchymosis posterior to the scar  Psychiatric:  grossly normal mood and affect, speech fluent and appropriate, AOx3 Neurologic:  CN 2-12 grossly intact, moves all extremities in coordinated fashion, sensation intact  Labs on Admission: I have personally reviewed following labs and imaging studies  CBC:  Recent Labs Lab 06/03/16 1737  WBC 10.7*  NEUTROABS 7.2  HGB 13.0  HCT 38.0  MCV 94.5  PLT 228   Basic Metabolic Panel:  Recent Labs Lab 06/03/16 1737  NA 138  K 4.4  CL 106  CO2 21*  GLUCOSE 98  BUN 22*  CREATININE 1.03*  CALCIUM 9.6   GFR: Estimated Creatinine Clearance: 28.7 mL/min (A) (by C-G formula based on SCr of 1.03 mg/dL (H)). Liver Function Tests:  Recent Labs Lab 06/03/16 1737  AST 35  ALT 25  ALKPHOS  64  BILITOT 0.5  PROT 6.9  ALBUMIN 4.3   No results for input(s): LIPASE, AMYLASE in the last 168 hours. No results for input(s): AMMONIA in the last 168 hours. Coagulation Profile: No results for input(s): INR, PROTIME in the last 168 hours. Cardiac Enzymes: No results for input(s): CKTOTAL, CKMB, CKMBINDEX, TROPONINI in the last 168 hours. BNP (last 3 results) No results for input(s): PROBNP in the last 8760 hours. HbA1C: No results for input(s): HGBA1C in the last 72 hours. CBG: No results for input(s): GLUCAP in the last 168 hours. Lipid Profile: No results for input(s): CHOL, HDL, LDLCALC, TRIG, CHOLHDL, LDLDIRECT in the  last 72 hours. Thyroid Function Tests: No results for input(s): TSH, T4TOTAL, FREET4, T3FREE, THYROIDAB in the last 72 hours. Anemia Panel: No results for input(s): VITAMINB12, FOLATE, FERRITIN, TIBC, IRON, RETICCTPCT in the last 72 hours. Urine analysis:    Component Value Date/Time   COLORURINE YELLOW 06/03/2016 2004   APPEARANCEUR CLEAR 06/03/2016 2004   LABSPEC <1.005 (L) 06/03/2016 2004   PHURINE 6.0 06/03/2016 2004   GLUCOSEU NEGATIVE 06/03/2016 2004   HGBUR TRACE (A) 06/03/2016 2004   BILIRUBINUR NEGATIVE 06/03/2016 2004   KETONESUR NEGATIVE 06/03/2016 2004   PROTEINUR NEGATIVE 06/03/2016 2004   UROBILINOGEN 0.2 03/21/2014 2257   NITRITE NEGATIVE 06/03/2016 2004   LEUKOCYTESUR TRACE (A) 06/03/2016 2004    Creatinine Clearance: Estimated Creatinine Clearance: 28.7 mL/min (A) (by C-G formula based on SCr of 1.03 mg/dL (H)).  Sepsis Labs: @LABRCNTIP (procalcitonin:4,lacticidven:4) )No results found for this or any previous visit (from the past 240 hour(s)).   Radiological Exams on Admission: Ct Head Wo Contrast  Result Date: 06/03/2016 CLINICAL DATA:  Patient fell without syncope. EXAM: CT HEAD WITHOUT CONTRAST CT CERVICAL SPINE WITHOUT CONTRAST TECHNIQUE: Multidetector CT imaging of the head and cervical spine was performed following the  standard protocol without intravenous contrast. Multiplanar CT image reconstructions of the cervical spine were also generated. COMPARISON:  05/04/2016 FINDINGS: CT HEAD FINDINGS Brain: Moderate degree of superficial and central atrophy appears chronic and stable with moderate-to-marked periventricular, subcortical and deep white matter are areas of hypodensity consistent with chronic microvascular ischemia. No acute intracranial hemorrhage. No intra-axial mass nor extra-axial fluid collections. Idiopathic basal ganglial calcifications are noted bilaterally. No effacement of the basal cisterns. The fourth ventricle is midline. There is no hydrocephalus. Vascular: No hyperdense vessel or unexpected calcification. Skull: Negative for fracture. Small osteoma in the right frontal sinus. Sinuses/Orbits: The mastoids are clear. Moderate circumferential mucosal thickening is seen of the sphenoid sinus on the left. Mild mucosal thickening of the ethmoid sinus. Intact orbits and globes with bilateral lens replacements surgeries. Other: None CT CERVICAL SPINE FINDINGS Alignment: Normal cervical lordosis. Skull base and vertebrae: No acute fracture of the vertebral bodies or primary bone lesion. Soft tissues and spinal canal: No prevertebral soft tissue swelling. No visible canal hematoma. Disc levels: Moderate disc space narrowing C4 through C7 with small posterior marginal osteophytes contributing to mild to moderate bilateral neural foraminal encroachment at C4-5 and C5-6. Upper chest: Right apical calcified pleural plaque. Other:  Atherosclerosis of the extracranial carotids. IMPRESSION: 1. Cerebral atrophy with chronic moderate to marked small vessel ischemic disease. No acute intracranial abnormality. 2. Cervical spondylosis without acute posttraumatic fracture nor subluxation. Electronically Signed   By: Tollie Eth M.D.   On: 06/03/2016 17:41   Ct Cervical Spine Wo Contrast  Result Date: 06/03/2016 CLINICAL DATA:   Patient fell without syncope. EXAM: CT HEAD WITHOUT CONTRAST CT CERVICAL SPINE WITHOUT CONTRAST TECHNIQUE: Multidetector CT imaging of the head and cervical spine was performed following the standard protocol without intravenous contrast. Multiplanar CT image reconstructions of the cervical spine were also generated. COMPARISON:  05/04/2016 FINDINGS: CT HEAD FINDINGS Brain: Moderate degree of superficial and central atrophy appears chronic and stable with moderate-to-marked periventricular, subcortical and deep white matter are areas of hypodensity consistent with chronic microvascular ischemia. No acute intracranial hemorrhage. No intra-axial mass nor extra-axial fluid collections. Idiopathic basal ganglial calcifications are noted bilaterally. No effacement of the basal cisterns. The fourth ventricle is midline. There is no hydrocephalus. Vascular: No hyperdense vessel or unexpected calcification. Skull: Negative for  fracture. Small osteoma in the right frontal sinus. Sinuses/Orbits: The mastoids are clear. Moderate circumferential mucosal thickening is seen of the sphenoid sinus on the left. Mild mucosal thickening of the ethmoid sinus. Intact orbits and globes with bilateral lens replacements surgeries. Other: None CT CERVICAL SPINE FINDINGS Alignment: Normal cervical lordosis. Skull base and vertebrae: No acute fracture of the vertebral bodies or primary bone lesion. Soft tissues and spinal canal: No prevertebral soft tissue swelling. No visible canal hematoma. Disc levels: Moderate disc space narrowing C4 through C7 with small posterior marginal osteophytes contributing to mild to moderate bilateral neural foraminal encroachment at C4-5 and C5-6. Upper chest: Right apical calcified pleural plaque. Other:  Atherosclerosis of the extracranial carotids. IMPRESSION: 1. Cerebral atrophy with chronic moderate to marked small vessel ischemic disease. No acute intracranial abnormality. 2. Cervical spondylosis without  acute posttraumatic fracture nor subluxation. Electronically Signed   By: Tollie Eth M.D.   On: 06/03/2016 17:41   Ct Hip Left Wo Contrast  Result Date: 06/03/2016 CLINICAL DATA:  Left hip pain. EXAM: CT OF THE LEFT HIP WITHOUT CONTRAST TECHNIQUE: Multidetector CT imaging of the left hip was performed according to the standard protocol. Multiplanar CT image reconstructions were also generated. COMPARISON:  Radiographs of same day. FINDINGS: Status post left hip arthroplasty. Nondisplaced periprosthetic fracture is noted along the medial cortex of the femur in the trochanteric and subtrochanteric regions. No soft tissue abnormality is noted. IMPRESSION: Nondisplaced periprosthetic fracture is noted along the medial cortex of the femur in the trochanteric and subtrochanteric regions. Electronically Signed   By: Lupita Raider, M.D.   On: 06/03/2016 20:42   Dg Hip Unilat W Or Wo Pelvis 2-3 Views Left  Result Date: 06/03/2016 CLINICAL DATA:  Pain after fall EXAM: DG HIP (WITH OR WITHOUT PELVIS) 2-3V LEFT COMPARISON:  03/27/2016 pelvic and preoperative views of the left hip. FINDINGS: On the frog-leg view of the left hip, there is a nondisplaced fracture lucency along the medial aspect of the sub trochanteric left femur not apparent on the prior comparison studies and therefore suspicious for a new fracture. No joint dislocations are identified. The bony pelvis appears intact. Partially imaged right femoral nail appears intact. There is lower lumbar degenerative disc disease at L4-5 and L5-S1 with associated facet arthropathy. IMPRESSION: 1. Nondisplaced fracture lucency is seen on the frog-leg view of the left hip along the medial subtrochanteric portion of the femur. This is not apparent on the prior comparison studies and is suspicious for new finding. 2. No acute pelvic fracture or joint dislocations. Electronically Signed   By: Tollie Eth M.D.   On: 06/03/2016 18:11    EKG: Independently reviewed.  NSR  with rate 60; nonspecific ST changes with no evidence of acute ischemia  Assessment/Plan Principal Problem:   Periprosthetic fracture around internal prosthetic left hip joint (HCC) Active Problems:   Essential hypertension   CAD (coronary artery disease)   Stroke (cerebrum) (HCC)   Left arm weakness   S/P hip hemiarthroplasty   Pertinent labs:  BUN 22/Creatinine 1.03/GFR 46; stable from 05/04/16 WBC 10.7  Left periprosthetic hip fracture -Mechanical fall resulting in hip fracture -Orthopedics consult done in ER; Dr. Lajoyce Corners to see tomorrow with likely plan for surgery later tomorrow -NPO after 10AM in anticipation of surgical repair tomorrow -Hold Plavix for now in anticipation of surgery; if not planned imminently, this likely needs to be resumed -SCDs overnight, start Lovenox post-operatively (or as per ortho) -CXR prior to surgery - not  done in ER -Pain control with Vicodin and morphine as per order set -SW consult for rehab placement -Will need PT consult post-operatively -Hip fracture order set utilized  S/p CVA with residual left arm weakness -This appears to have occurred during prior hospitalization in 3/18 -Based on d/c summary note, she has made significant recovery in her hand/arm strength -Will need to resume Plavix as soon as feasible  HTN -Continue Lopressor -Mild bradycardia but not enough to warrant discontinuation  CAD -Continue Lipitor -Holding Plavix, as above -It might be reasonable to start ASA empirically if Plavix cannot be resumed soon   DVT prophylaxis:  SCDs until cleared for Lovenox by orthopedics Code Status: DNR - confirmed with patient Family Communication: None present - but her daughter-in-law was present until just prior to my evaluation and is aware of the admission Disposition Plan:  Likely to need SNF rehab once clinically improved Consults called: Orthopedics; SW, nutrition; will need PT/OT post-operatively Admission status: Admit - It  is my clinical opinion that admission to INPATIENT is reasonable and necessary because this patient will require at least 2 midnights in the hospital to treat this condition based on the medical complexity of the problems presented.  Given the aforementioned information, the predictability of an adverse outcome is felt to be significant.    Jonah BlueJennifer Jas Betten MD [facility]  If 7PM-7AM, please contact night-coverage [email or web address] Password [email or web address]  06/03/2016, 9:55 PM

## 2016-06-03 NOTE — ED Notes (Signed)
Patient transported to CT 

## 2016-06-04 ENCOUNTER — Other Ambulatory Visit (INDEPENDENT_AMBULATORY_CARE_PROVIDER_SITE_OTHER): Payer: Self-pay | Admitting: Family

## 2016-06-04 ENCOUNTER — Telehealth (INDEPENDENT_AMBULATORY_CARE_PROVIDER_SITE_OTHER): Payer: Self-pay | Admitting: Orthopedic Surgery

## 2016-06-04 DIAGNOSIS — I639 Cerebral infarction, unspecified: Secondary | ICD-10-CM

## 2016-06-04 DIAGNOSIS — M9702XA Periprosthetic fracture around internal prosthetic left hip joint, initial encounter: Principal | ICD-10-CM

## 2016-06-04 LAB — SURGICAL PCR SCREEN
MRSA, PCR: NEGATIVE
STAPHYLOCOCCUS AUREUS: NEGATIVE

## 2016-06-04 MED ORDER — MORPHINE SULFATE (PF) 4 MG/ML IV SOLN
1.0000 mg | INTRAVENOUS | Status: DC | PRN
  Administered 2016-06-04 – 2016-06-05 (×4): 2 mg via INTRAVENOUS
  Filled 2016-06-04 (×5): qty 1

## 2016-06-04 NOTE — Progress Notes (Signed)
Initial Nutrition Assessment  DOCUMENTATION CODES:   Not applicable  INTERVENTION: Once diet advances, provide Ensure Enlive po BID, each supplement provides 350 kcal and 20 grams of protein.  Encourage adequate PO intake.   NUTRITION DIAGNOSIS:   Increased nutrient needs related to  (surgery) as evidenced by estimated needs.  GOAL:   Patient will meet greater than or equal to 90% of their needs  MONITOR:   PO intake, Supplement acceptance, Diet advancement, Weight trends, Labs, I & O's, Skin  REASON FOR ASSESSMENT:   Consult Hip fracture protocol  ASSESSMENT:   83 y.o. female with medical history significant of coronary artery disease status post stenting, hypertension, and left hip fracture in 3/17 requiring left hip hemiarthroplasty.  She fell today and landed again on her left hip.  Periprosthetic hip fracture on x-ray  Plans for surgery tomorrow. Pt reports some hunger during time of visit. Pt reports eating well PTA with usual consumption of at least 3 meals a day with no other difficulties. Question accuracy of recently recorded weight as pt was weighed on bed scale during time of visit which revealed 118 lbs. Pt reports usual body weight of ~120 lbs. Pt usually consumes Ensure at least once daily. RD to order Ensure once diet advances to aid in caloric and protein needs.   Nutrition-Focused physical exam completed. Findings are no fat depletion, moderate to severe muscle depletion, and no edema.   Labs and medications reviewed.   Diet Order:  Diet NPO time specified  Skin:  Reviewed, no issues  Last BM:  Unknown  Height:   Ht Readings from Last 1 Encounters:  06/04/16 5\' 3"  (1.6 m)    Weight:   Wt Readings from Last 1 Encounters:  06/04/16 103 lb (46.7 kg)  Bed scale 118 lbs (53.6 kg)  Ideal Body Weight:  52.27 kg  BMI:  Body mass index is 18.25 kg/m.  Estimated Nutritional Needs:   Kcal:  1350-1550  Protein:  55-65 grams  Fluid:  >/= 1.5  L/day  EDUCATION NEEDS:   No education needs identified at this time  Roslyn SmilingStephanie Wilbur Labuda, MS, RD, LDN Pager # [phone] After hours/ weekend pager # [phone]

## 2016-06-04 NOTE — Telephone Encounter (Signed)
Patients daughter called asking some questions about her mothers surgery tomorrow. She was asking how long the recovery would be, what exactly the surgery is for, etc. If you could please give her a call. CB # [phone]

## 2016-06-04 NOTE — Consult Note (Signed)
ORTHOPAEDIC CONSULTATION  REQUESTING PHYSICIAN: Kathlen Mody, MD  Chief Complaint: Left hip pain.  HPI: Jessica Booth is a 83 y.o. female who presents with acute left hip pain after a fall onto her left hip when getting up in the bathroom.  Past Medical History:  Diagnosis Date  . Abnormal liver function test   . Coronary artery disease 2004   stent RCA  . Hyperlipidemia   . Hypertension   . Irritable bowel syndrome   . Stroke [facility])    Past Surgical History:  Procedure Laterality Date  . FEMUR FRACTURE SURGERY    . HIP ARTHROPLASTY Left 03/27/2016   Procedure: ARTHROPLASTY BIPOLAR HIP (HEMIARTHROPLASTY);  Surgeon: Nadara Mustard, MD;  Location: [facility] OR;  Service: Orthopedics;  Laterality: Left;   Social History   Social History  . Marital status: Married    Spouse name: N/A  . Number of children: N/A  . Years of education: N/A   Occupational History  . retired    Social History Main Topics  . Smoking status: Never Smoker  . Smokeless tobacco: Never Used  . Alcohol use Yes     Comment: beer rarely  . Drug use: Unknown  . Sexual activity: Not Asked   Other Topics Concern  . None   Social History Narrative  . None   Family History  Problem Relation Age of Onset  . Hypertension Sister    - negative except otherwise stated in the family history section Allergies  Allergen Reactions  . Penicillins Hives and Rash   Prior to Admission medications   Medication Sig Start Date End Date Taking? Authorizing Provider  acetaminophen (TYLENOL) 325 MG tablet Take 2 tablets (650 mg total) by mouth every 6 (six) hours as needed for mild pain (or Fever >/= 101). Patient taking differently: Take 500 mg by mouth every 6 (six) hours.  03/30/16  Yes Osvaldo Shipper, MD  citalopram (CELEXA) 20 MG tablet Take 20 mg by mouth daily.   Yes [provider]  clopidogrel (PLAVIX) 75 MG tablet Take 1 tablet (75 mg total) by mouth daily. 03/30/16  Yes Osvaldo Shipper, MD    clotrimazole (MYCELEX) 10 MG troche Take 10 mg by mouth daily as needed (yeast).   Yes [provider]  feeding supplement, ENSURE ENLIVE, (ENSURE ENLIVE) LIQD Take 237 mLs by mouth 2 (two) times daily between meals. 03/30/16  Yes Osvaldo Shipper, MD  loperamide (IMODIUM) 2 MG capsule Take 2 mg by mouth every 6 (six) hours as needed for diarrhea or loose stools.   Yes [provider]  meclizine (ANTIVERT) 12.5 MG tablet Take 1 tablet (12.5 mg total) by mouth 2 (two) times daily as needed for dizziness. 03/30/16  Yes Osvaldo Shipper, MD  metoprolol tartrate (LOPRESSOR) 25 MG tablet Take 25 mg by mouth 2 (two) times daily.   Yes [provider]  senna (SENOKOT) 8.6 MG TABS tablet Take 1 tablet by mouth daily as needed for mild constipation.   Yes [provider]  enoxaparin (LOVENOX) 30 MG/0.3ML injection Inject 0.3 mLs (30 mg total) into the skin daily. For 2 weeks. 03/30/16 04/13/16  Osvaldo Shipper, MD   Ct Head Wo Contrast  Result Date: 06/03/2016 CLINICAL DATA:  Patient fell without syncope. EXAM: CT HEAD WITHOUT CONTRAST CT CERVICAL SPINE WITHOUT CONTRAST TECHNIQUE: Multidetector CT imaging of the head and cervical spine was performed following the standard protocol without intravenous contrast. Multiplanar CT image reconstructions of the cervical spine were also  generated. COMPARISON:  05/04/2016 FINDINGS: CT HEAD FINDINGS Brain: Moderate degree of superficial and central atrophy appears chronic and stable with moderate-to-marked periventricular, subcortical and deep white matter are areas of hypodensity consistent with chronic microvascular ischemia. No acute intracranial hemorrhage. No intra-axial mass nor extra-axial fluid collections. Idiopathic basal ganglial calcifications are noted bilaterally. No effacement of the basal cisterns. The fourth ventricle is midline. There is no hydrocephalus. Vascular: No hyperdense vessel or unexpected calcification. Skull:  Negative for fracture. Small osteoma in the right frontal sinus. Sinuses/Orbits: The mastoids are clear. Moderate circumferential mucosal thickening is seen of the sphenoid sinus on the left. Mild mucosal thickening of the ethmoid sinus. Intact orbits and globes with bilateral lens replacements surgeries. Other: None CT CERVICAL SPINE FINDINGS Alignment: Normal cervical lordosis. Skull base and vertebrae: No acute fracture of the vertebral bodies or primary bone lesion. Soft tissues and spinal canal: No prevertebral soft tissue swelling. No visible canal hematoma. Disc levels: Moderate disc space narrowing C4 through C7 with small posterior marginal osteophytes contributing to mild to moderate bilateral neural foraminal encroachment at C4-5 and C5-6. Upper chest: Right apical calcified pleural plaque. Other:  Atherosclerosis of the extracranial carotids. IMPRESSION: 1. Cerebral atrophy with chronic moderate to marked small vessel ischemic disease. No acute intracranial abnormality. 2. Cervical spondylosis without acute posttraumatic fracture nor subluxation. Electronically Signed   By: Tollie Ethavid  Kwon M.D.   On: 06/03/2016 17:41   Ct Cervical Spine Wo Contrast  Result Date: 06/03/2016 CLINICAL DATA:  Patient fell without syncope. EXAM: CT HEAD WITHOUT CONTRAST CT CERVICAL SPINE WITHOUT CONTRAST TECHNIQUE: Multidetector CT imaging of the head and cervical spine was performed following the standard protocol without intravenous contrast. Multiplanar CT image reconstructions of the cervical spine were also generated. COMPARISON:  05/04/2016 FINDINGS: CT HEAD FINDINGS Brain: Moderate degree of superficial and central atrophy appears chronic and stable with moderate-to-marked periventricular, subcortical and deep white matter are areas of hypodensity consistent with chronic microvascular ischemia. No acute intracranial hemorrhage. No intra-axial mass nor extra-axial fluid collections. Idiopathic basal ganglial  calcifications are noted bilaterally. No effacement of the basal cisterns. The fourth ventricle is midline. There is no hydrocephalus. Vascular: No hyperdense vessel or unexpected calcification. Skull: Negative for fracture. Small osteoma in the right frontal sinus. Sinuses/Orbits: The mastoids are clear. Moderate circumferential mucosal thickening is seen of the sphenoid sinus on the left. Mild mucosal thickening of the ethmoid sinus. Intact orbits and globes with bilateral lens replacements surgeries. Other: None CT CERVICAL SPINE FINDINGS Alignment: Normal cervical lordosis. Skull base and vertebrae: No acute fracture of the vertebral bodies or primary bone lesion. Soft tissues and spinal canal: No prevertebral soft tissue swelling. No visible canal hematoma. Disc levels: Moderate disc space narrowing C4 through C7 with small posterior marginal osteophytes contributing to mild to moderate bilateral neural foraminal encroachment at C4-5 and C5-6. Upper chest: Right apical calcified pleural plaque. Other:  Atherosclerosis of the extracranial carotids. IMPRESSION: 1. Cerebral atrophy with chronic moderate to marked small vessel ischemic disease. No acute intracranial abnormality. 2. Cervical spondylosis without acute posttraumatic fracture nor subluxation. Electronically Signed   By: Tollie Ethavid  Kwon M.D.   On: 06/03/2016 17:41   Ct Hip Left Wo Contrast  Result Date: 06/03/2016 CLINICAL DATA:  Left hip pain. EXAM: CT OF THE LEFT HIP WITHOUT CONTRAST TECHNIQUE: Multidetector CT imaging of the left hip was performed according to the standard protocol. Multiplanar CT image reconstructions were also generated. COMPARISON:  Radiographs of same day. FINDINGS: Status post left  hip arthroplasty. Nondisplaced periprosthetic fracture is noted along the medial cortex of the femur in the trochanteric and subtrochanteric regions. No soft tissue abnormality is noted. IMPRESSION: Nondisplaced periprosthetic fracture is noted along  the medial cortex of the femur in the trochanteric and subtrochanteric regions. Electronically Signed   By: Lupita Raider, M.D.   On: 06/03/2016 20:42   Chest Portable 1 View  Result Date: 06/04/2016 CLINICAL DATA:  83 year old female with shortness of breath. Left hip periprostatic fracture. EXAM: PORTABLE CHEST 1 VIEW COMPARISON:  Chest radiograph dated 03/27/2016 FINDINGS: The lungs are clear. There is no pleural effusion or pneumothorax. The cardiac silhouette is within normal limits. Osteopenia. No acute osseous pathology. Right upper quadrant cholecystectomy clips. IMPRESSION: No active disease. Electronically Signed   By: Elgie Collard M.D.   On: 06/04/2016 00:16   Dg Hip Unilat W Or Wo Pelvis 2-3 Views Left  Result Date: 06/03/2016 CLINICAL DATA:  Pain after fall EXAM: DG HIP (WITH OR WITHOUT PELVIS) 2-3V LEFT COMPARISON:  03/27/2016 pelvic and preoperative views of the left hip. FINDINGS: On the frog-leg view of the left hip, there is a nondisplaced fracture lucency along the medial aspect of the sub trochanteric left femur not apparent on the prior comparison studies and therefore suspicious for a new fracture. No joint dislocations are identified. The bony pelvis appears intact. Partially imaged right femoral nail appears intact. There is lower lumbar degenerative disc disease at L4-5 and L5-S1 with associated facet arthropathy. IMPRESSION: 1. Nondisplaced fracture lucency is seen on the frog-leg view of the left hip along the medial subtrochanteric portion of the femur. This is not apparent on the prior comparison studies and is suspicious for new finding. 2. No acute pelvic fracture or joint dislocations. Electronically Signed   By: Tollie Eth M.D.   On: 06/03/2016 18:11   - pertinent xrays, CT, MRI studies were reviewed and independently interpreted  Positive ROS: All other systems have been reviewed and were otherwise negative with the exception of those mentioned in the HPI and as  above.  Physical Exam: General: Alert, no acute distress Psychiatric: Patient is competent for consent with normal mood and affect Lymphatic: No axillary or cervical lymphadenopathy Cardiovascular: No pedal edema Respiratory: No cyanosis, no use of accessory musculature GI: No organomegaly, abdomen is soft and non-tender  Skin:  Patient has no skin breakdown or lesions.   Neurologic: Patient does have protective sensation bilateral lower extremities.   MUSCULOSKELETAL:  Patient has pain with attempted range of motion of the left hip. Review of the radiographs and CT scan shows a nondisplaced proximal left femur fracture periprosthetic with a hemiarthroplasty present.  Assessment: Assessment: Periprosthetic left proximal femur fracture.  Plan: Plan: We'll plan for internal fixation of the periprosthetic femur fracture with cerclage wire. We will plan for surgery tomorrow Wednesday. Patient may eat today until midnight.  Thank you for the consult and the opportunity to see Ms. Josely Moffat, MD [facility] [phone] 7:01 AM

## 2016-06-04 NOTE — Progress Notes (Signed)
PROGRESS NOTE    Jessica Booth  ZOX:[id number] DOB: 12-Nov-1922 DOA: 06/03/2016 PCP: Vivien Presto, MD    Brief Narrative: Jessica Booth is a 83 y.o. female with medical history significant of coronary artery disease status post stenting, hypertension, and left hip fracture in 3/17 requiring left hip hemiarthroplasty. She fell today and landed on the elft hip.  She was found to have left periprosthetic hip fracture. She was admitted for surgical repair.   Assessment & Plan:   Principal Problem:   Periprosthetic fracture around internal prosthetic left hip joint (HCC) Active Problems:   Essential hypertension   CAD (coronary artery disease)   Stroke (cerebrum) (HCC)   Left arm weakness   S/P hip hemiarthroplasty  Left periprosthetic hip fracture Admitted for surgical repair, orthopedics consulted.  Pain control and anticipate surgery in the next 24 hours.    H/o CVA:  With residual arm weakness.  Resume plavix post surgery.    Hypertension: sub optimal.  Possibly from pain.  Pain control.   CAD: denies chest pain or sob.  Resume plavix post surgery.    DVT prophylaxis: scd's Code Status: DNR Family Communication: none at bedside.  Disposition Plan: pending further eval.   Consultants:   Dr Lajoyce Corners.   Procedures: none.   Antimicrobials: none   Subjective: Pain is not well controlled.   Objective: Vitals:   06/03/16 2200 06/04/16 0006 06/04/16 0600 06/04/16 1429  BP: (!) 169/74 (!) 160/68 (!) 140/59 (!) 179/69  Pulse: 86 70 (!) 55 68  Resp: 20 12 12    Temp:  98.6 F (37 C) 97.9 F (36.6 C) 97.6 F (36.4 C)  TempSrc:  Oral Oral Oral  SpO2: 98% 98% 97% 95%  Weight:  46.7 kg (103 lb)    Height:  5\' 3"  (1.6 m)     No intake or output data in the 24 hours ending 06/04/16 1820 Filed Weights   06/03/16 1626 06/04/16 0006  Weight: 52.2 kg (115 lb) 46.7 kg (103 lb)    Examination:  General exam: Appears calm and comfortable  Respiratory system:  Clear to auscultation. Respiratory effort normal. Cardiovascular system: S1 & S2 heard, RRR. No JVD, murmurs, rubs, gallops or clicks. No pedal edema. Gastrointestinal system: Abdomen is nondistended, soft and nontender. No organomegaly or masses felt. Normal bowel sounds heard. Central nervous system: Alert and confused. . No focal neurological deficits. Extremities: left hip tenderness.  Skin: No rashes, lesions or ulcers     Data Reviewed: I have personally reviewed following labs and imaging studies  CBC:  Recent Labs Lab 06/03/16 1737  WBC 10.7*  NEUTROABS 7.2  HGB 13.0  HCT 38.0  MCV 94.5  PLT 228   Basic Metabolic Panel:  Recent Labs Lab 06/03/16 1737  NA 138  K 4.4  CL 106  CO2 21*  GLUCOSE 98  BUN 22*  CREATININE 1.03*  CALCIUM 9.6   GFR: Estimated Creatinine Clearance: 25.7 mL/min (A) (by C-G formula based on SCr of 1.03 mg/dL (H)). Liver Function Tests:  Recent Labs Lab 06/03/16 1737  AST 35  ALT 25  ALKPHOS 64  BILITOT 0.5  PROT 6.9  ALBUMIN 4.3   No results for input(s): LIPASE, AMYLASE in the last 168 hours. No results for input(s): AMMONIA in the last 168 hours. Coagulation Profile: No results for input(s): INR, PROTIME in the last 168 hours. Cardiac Enzymes: No results for input(s): CKTOTAL, CKMB, CKMBINDEX, TROPONINI in the last 168 hours. BNP (last 3 results)  No results for input(s): PROBNP in the last 8760 hours. HbA1C: No results for input(s): HGBA1C in the last 72 hours. CBG: No results for input(s): GLUCAP in the last 168 hours. Lipid Profile: No results for input(s): CHOL, HDL, LDLCALC, TRIG, CHOLHDL, LDLDIRECT in the last 72 hours. Thyroid Function Tests: No results for input(s): TSH, T4TOTAL, FREET4, T3FREE, THYROIDAB in the last 72 hours. Anemia Panel: No results for input(s): VITAMINB12, FOLATE, FERRITIN, TIBC, IRON, RETICCTPCT in the last 72 hours. Sepsis Labs: No results for input(s): PROCALCITON, LATICACIDVEN in the  last 168 hours.  Recent Results (from the past 240 hour(s))  Surgical PCR screen     Status: None   Collection Time: 06/04/16  3:22 AM  Result Value Ref Range Status   MRSA, PCR NEGATIVE NEGATIVE Final   Staphylococcus aureus NEGATIVE NEGATIVE Final    Comment:        The Xpert SA Assay (FDA approved for NASAL specimens in patients over 81 years of age), is one component of a comprehensive surveillance program.  Test performance has been validated by [facility] for patients greater than or equal to 81 year old. It is not intended to diagnose infection nor to guide or monitor treatment.          Radiology Studies: Ct Head Wo Contrast  Result Date: 06/03/2016 CLINICAL DATA:  Patient fell without syncope. EXAM: CT HEAD WITHOUT CONTRAST CT CERVICAL SPINE WITHOUT CONTRAST TECHNIQUE: Multidetector CT imaging of the head and cervical spine was performed following the standard protocol without intravenous contrast. Multiplanar CT image reconstructions of the cervical spine were also generated. COMPARISON:  05/04/2016 FINDINGS: CT HEAD FINDINGS Brain: Moderate degree of superficial and central atrophy appears chronic and stable with moderate-to-marked periventricular, subcortical and deep white matter are areas of hypodensity consistent with chronic microvascular ischemia. No acute intracranial hemorrhage. No intra-axial mass nor extra-axial fluid collections. Idiopathic basal ganglial calcifications are noted bilaterally. No effacement of the basal cisterns. The fourth ventricle is midline. There is no hydrocephalus. Vascular: No hyperdense vessel or unexpected calcification. Skull: Negative for fracture. Small osteoma in the right frontal sinus. Sinuses/Orbits: The mastoids are clear. Moderate circumferential mucosal thickening is seen of the sphenoid sinus on the left. Mild mucosal thickening of the ethmoid sinus. Intact orbits and globes with bilateral lens replacements surgeries. Other:  None CT CERVICAL SPINE FINDINGS Alignment: Normal cervical lordosis. Skull base and vertebrae: No acute fracture of the vertebral bodies or primary bone lesion. Soft tissues and spinal canal: No prevertebral soft tissue swelling. No visible canal hematoma. Disc levels: Moderate disc space narrowing C4 through C7 with small posterior marginal osteophytes contributing to mild to moderate bilateral neural foraminal encroachment at C4-5 and C5-6. Upper chest: Right apical calcified pleural plaque. Other:  Atherosclerosis of the extracranial carotids. IMPRESSION: 1. Cerebral atrophy with chronic moderate to marked small vessel ischemic disease. No acute intracranial abnormality. 2. Cervical spondylosis without acute posttraumatic fracture nor subluxation. Electronically Signed   By: Tollie Ethavid  Kwon M.D.   On: 06/03/2016 17:41   Ct Cervical Spine Wo Contrast  Result Date: 06/03/2016 CLINICAL DATA:  Patient fell without syncope. EXAM: CT HEAD WITHOUT CONTRAST CT CERVICAL SPINE WITHOUT CONTRAST TECHNIQUE: Multidetector CT imaging of the head and cervical spine was performed following the standard protocol without intravenous contrast. Multiplanar CT image reconstructions of the cervical spine were also generated. COMPARISON:  05/04/2016 FINDINGS: CT HEAD FINDINGS Brain: Moderate degree of superficial and central atrophy appears chronic and stable with moderate-to-marked periventricular, subcortical and  deep white matter are areas of hypodensity consistent with chronic microvascular ischemia. No acute intracranial hemorrhage. No intra-axial mass nor extra-axial fluid collections. Idiopathic basal ganglial calcifications are noted bilaterally. No effacement of the basal cisterns. The fourth ventricle is midline. There is no hydrocephalus. Vascular: No hyperdense vessel or unexpected calcification. Skull: Negative for fracture. Small osteoma in the right frontal sinus. Sinuses/Orbits: The mastoids are clear. Moderate  circumferential mucosal thickening is seen of the sphenoid sinus on the left. Mild mucosal thickening of the ethmoid sinus. Intact orbits and globes with bilateral lens replacements surgeries. Other: None CT CERVICAL SPINE FINDINGS Alignment: Normal cervical lordosis. Skull base and vertebrae: No acute fracture of the vertebral bodies or primary bone lesion. Soft tissues and spinal canal: No prevertebral soft tissue swelling. No visible canal hematoma. Disc levels: Moderate disc space narrowing C4 through C7 with small posterior marginal osteophytes contributing to mild to moderate bilateral neural foraminal encroachment at C4-5 and C5-6. Upper chest: Right apical calcified pleural plaque. Other:  Atherosclerosis of the extracranial carotids. IMPRESSION: 1. Cerebral atrophy with chronic moderate to marked small vessel ischemic disease. No acute intracranial abnormality. 2. Cervical spondylosis without acute posttraumatic fracture nor subluxation. Electronically Signed   By: Tollie Eth M.D.   On: 06/03/2016 17:41   Ct Hip Left Wo Contrast  Result Date: 06/03/2016 CLINICAL DATA:  Left hip pain. EXAM: CT OF THE LEFT HIP WITHOUT CONTRAST TECHNIQUE: Multidetector CT imaging of the left hip was performed according to the standard protocol. Multiplanar CT image reconstructions were also generated. COMPARISON:  Radiographs of same day. FINDINGS: Status post left hip arthroplasty. Nondisplaced periprosthetic fracture is noted along the medial cortex of the femur in the trochanteric and subtrochanteric regions. No soft tissue abnormality is noted. IMPRESSION: Nondisplaced periprosthetic fracture is noted along the medial cortex of the femur in the trochanteric and subtrochanteric regions. Electronically Signed   By: Lupita Raider, M.D.   On: 06/03/2016 20:42   Chest Portable 1 View  Result Date: 06/04/2016 CLINICAL DATA:  83 year old female with shortness of breath. Left hip periprostatic fracture. EXAM: PORTABLE  CHEST 1 VIEW COMPARISON:  Chest radiograph dated 03/27/2016 FINDINGS: The lungs are clear. There is no pleural effusion or pneumothorax. The cardiac silhouette is within normal limits. Osteopenia. No acute osseous pathology. Right upper quadrant cholecystectomy clips. IMPRESSION: No active disease. Electronically Signed   By: Elgie Collard M.D.   On: 06/04/2016 00:16   Dg Hip Unilat W Or Wo Pelvis 2-3 Views Left  Result Date: 06/03/2016 CLINICAL DATA:  Pain after fall EXAM: DG HIP (WITH OR WITHOUT PELVIS) 2-3V LEFT COMPARISON:  03/27/2016 pelvic and preoperative views of the left hip. FINDINGS: On the frog-leg view of the left hip, there is a nondisplaced fracture lucency along the medial aspect of the sub trochanteric left femur not apparent on the prior comparison studies and therefore suspicious for a new fracture. No joint dislocations are identified. The bony pelvis appears intact. Partially imaged right femoral nail appears intact. There is lower lumbar degenerative disc disease at L4-5 and L5-S1 with associated facet arthropathy. IMPRESSION: 1. Nondisplaced fracture lucency is seen on the frog-leg view of the left hip along the medial subtrochanteric portion of the femur. This is not apparent on the prior comparison studies and is suspicious for new finding. 2. No acute pelvic fracture or joint dislocations. Electronically Signed   By: Tollie Eth M.D.   On: 06/03/2016 18:11        Scheduled Meds: .  citalopram  20 mg Oral Daily  . feeding supplement (ENSURE ENLIVE)  237 mL Oral BID BM  . metoprolol tartrate  25 mg Oral BID   Continuous Infusions:   LOS: 1 day    Time spent: 35 minutes.     Kathlen Mody, MD [facility] Pager [phone]   If 7PM-7AM, please contact night-coverage [email or web address] Password [email or web address] 06/04/2016, 6:20 PM

## 2016-06-05 ENCOUNTER — Inpatient Hospital Stay (HOSPITAL_COMMUNITY): Payer: Medicare Other | Admitting: Anesthesiology

## 2016-06-05 ENCOUNTER — Encounter (HOSPITAL_COMMUNITY)
Admission: EM | Disposition: A | Discharge status: Skilled Nursing Facility | Payer: Self-pay | Source: Home / Self Care | Attending: Internal Medicine

## 2016-06-05 ENCOUNTER — Inpatient Hospital Stay (HOSPITAL_COMMUNITY): Payer: Medicare Other

## 2016-06-05 DIAGNOSIS — I10 Essential (primary) hypertension: Secondary | ICD-10-CM

## 2016-06-05 DIAGNOSIS — M80052G Age-related osteoporosis with current pathological fracture, left femur, subsequent encounter for fracture with delayed healing: Secondary | ICD-10-CM

## 2016-06-05 DIAGNOSIS — M9702XS Periprosthetic fracture around internal prosthetic left hip joint, sequela: Secondary | ICD-10-CM

## 2016-06-05 DIAGNOSIS — M9702XD Periprosthetic fracture around internal prosthetic left hip joint, subsequent encounter: Secondary | ICD-10-CM

## 2016-06-05 HISTORY — PX: ORIF PERIPROSTHETIC FRACTURE: SHX5034

## 2016-06-05 SURGERY — OPEN REDUCTION INTERNAL FIXATION (ORIF) PERIPROSTHETIC FRACTURE
Anesthesia: General | Site: Leg Upper | Laterality: Left

## 2016-06-05 MED ORDER — ACETAMINOPHEN 325 MG PO TABS: 650.0000 mg | ORAL_TABLET | Freq: Four times a day (QID) | ORAL | Status: DC | PRN

## 2016-06-05 MED ORDER — LACTATED RINGERS IV SOLN
INTRAVENOUS | Status: DC
  Administered 2016-06-05 (×2): via INTRAVENOUS

## 2016-06-05 MED ORDER — CHLORHEXIDINE GLUCONATE 4 % EX LIQD: 60.0000 mL | Freq: Once | CUTANEOUS | Status: DC

## 2016-06-05 MED ORDER — HYDROCODONE-ACETAMINOPHEN 5-325 MG PO TABS
1.0000 | ORAL_TABLET | Freq: Four times a day (QID) | ORAL | Status: DC | PRN
  Administered 2016-06-06: 2 via ORAL
  Administered 2016-06-07: 1 via ORAL
  Administered 2016-06-07 – 2016-06-10 (×8): 2 via ORAL
  Filled 2016-06-05 (×9): qty 2
  Filled 2016-06-05: qty 1
  Filled 2016-06-05 (×2): qty 2

## 2016-06-05 MED ORDER — ROCURONIUM BROMIDE 100 MG/10ML IV SOLN
INTRAVENOUS | Status: DC | PRN
  Administered 2016-06-05: 30 mg via INTRAVENOUS

## 2016-06-05 MED ORDER — FENTANYL CITRATE (PF) 100 MCG/2ML IJ SOLN
INTRAMUSCULAR | Status: DC | PRN
  Administered 2016-06-05: 150 ug via INTRAVENOUS

## 2016-06-05 MED ORDER — SODIUM CHLORIDE 0.9 % IR SOLN
Status: DC | PRN
  Administered 2016-06-05: 1000 mL

## 2016-06-05 MED ORDER — ONDANSETRON HCL 4 MG PO TABS: 4.0000 mg | ORAL_TABLET | Freq: Four times a day (QID) | ORAL | Status: DC | PRN

## 2016-06-05 MED ORDER — ONDANSETRON HCL 4 MG/2ML IJ SOLN: 4.0000 mg | Freq: Once | INTRAMUSCULAR | Status: DC | PRN

## 2016-06-05 MED ORDER — ONDANSETRON HCL 4 MG/2ML IJ SOLN: 4.0000 mg | Freq: Four times a day (QID) | INTRAMUSCULAR | Status: DC | PRN

## 2016-06-05 MED ORDER — LIDOCAINE HCL (CARDIAC) 20 MG/ML IV SOLN
INTRAVENOUS | Status: DC | PRN
  Administered 2016-06-05: 60 mg via INTRAVENOUS

## 2016-06-05 MED ORDER — CLINDAMYCIN PHOSPHATE 900 MG/50ML IV SOLN
900.0000 mg | INTRAVENOUS | Status: DC
  Filled 2016-06-05 (×2): qty 50

## 2016-06-05 MED ORDER — SUGAMMADEX SODIUM 200 MG/2ML IV SOLN
INTRAVENOUS | Status: DC | PRN
  Administered 2016-06-05: 100 mg via INTRAVENOUS

## 2016-06-05 MED ORDER — EPHEDRINE SULFATE 50 MG/ML IJ SOLN
INTRAMUSCULAR | Status: DC | PRN
  Administered 2016-06-05 (×2): 5 mg via INTRAVENOUS

## 2016-06-05 MED ORDER — SUCCINYLCHOLINE CHLORIDE 20 MG/ML IJ SOLN
INTRAMUSCULAR | Status: DC | PRN
  Administered 2016-06-05: 40 mg via INTRAVENOUS

## 2016-06-05 MED ORDER — MORPHINE SULFATE (PF) 4 MG/ML IV SOLN
0.5000 mg | INTRAVENOUS | Status: DC | PRN
  Administered 2016-06-06: 0.52 mg via INTRAVENOUS
  Filled 2016-06-05: qty 1

## 2016-06-05 MED ORDER — ACETAMINOPHEN 650 MG RE SUPP: 650.0000 mg | Freq: Four times a day (QID) | RECTAL | Status: DC | PRN

## 2016-06-05 MED ORDER — PHENYLEPHRINE HCL 10 MG/ML IJ SOLN
INTRAMUSCULAR | Status: DC | PRN
  Administered 2016-06-05 (×2): 80 ug via INTRAVENOUS

## 2016-06-05 MED ORDER — MENTHOL 3 MG MT LOZG: 1.0000 | LOZENGE | OROMUCOSAL | Status: DC | PRN

## 2016-06-05 MED ORDER — ASPIRIN EC 325 MG PO TBEC: 325.0000 mg | DELAYED_RELEASE_TABLET | Freq: Every day | ORAL | Status: DC

## 2016-06-05 MED ORDER — METOCLOPRAMIDE HCL 5 MG/ML IJ SOLN: 5.0000 mg | Freq: Three times a day (TID) | INTRAMUSCULAR | Status: DC | PRN

## 2016-06-05 MED ORDER — PHENOL 1.4 % MT LIQD: 1.0000 | OROMUCOSAL | Status: DC | PRN

## 2016-06-05 MED ORDER — PROPOFOL 10 MG/ML IV BOLUS
INTRAVENOUS | Status: DC | PRN
  Administered 2016-06-05: 100 mg via INTRAVENOUS

## 2016-06-05 MED ORDER — METOCLOPRAMIDE HCL 5 MG PO TABS: 5.0000 mg | ORAL_TABLET | Freq: Three times a day (TID) | ORAL | Status: DC | PRN

## 2016-06-05 MED ORDER — FENTANYL CITRATE (PF) 100 MCG/2ML IJ SOLN: 25.0000 ug | INTRAMUSCULAR | Status: DC | PRN

## 2016-06-05 MED ORDER — DEXTROSE 5 % IV SOLN
INTRAVENOUS | Status: DC | PRN
  Administered 2016-06-05: 40 ug/min via INTRAVENOUS

## 2016-06-05 SURGICAL SUPPLY — 55 items
BLADE SAW SAG 73X25 THK (BLADE)
BLADE SAW SGTL 73X25 THK (BLADE) ×1 IMPLANT
BLADE SURG 10 STRL SS (BLADE) ×2 IMPLANT
BLADE SURG 21 STRL SS (BLADE) ×3 IMPLANT
BRUSH FEMORAL CANAL (MISCELLANEOUS) IMPLANT
CABLE CERLAGE W/CRIMP 1.8 (Cable) ×2 IMPLANT
CABLE CERLAGE W/CRIMP 1.8MM (Cable) ×2 IMPLANT
CAPT HIP HEMI 1 ×2 IMPLANT
COVER BACK TABLE 24X17X13 BIG (DRAPES) IMPLANT
COVER SURGICAL LIGHT HANDLE (MISCELLANEOUS) ×4 IMPLANT
DRAPE IMP U-DRAPE 54X76 (DRAPES) ×3 IMPLANT
DRAPE INCISE IOBAN 85X60 (DRAPES) ×3 IMPLANT
DRAPE ORTHO SPLIT 77X108 STRL (DRAPES)
DRAPE STERI IOBAN 125X83 (DRAPES) ×2 IMPLANT
DRAPE SURG ORHT 6 SPLT 77X108 (DRAPES) ×2 IMPLANT
DRAPE U-SHAPE 47X51 STRL (DRAPES) ×3 IMPLANT
DRSG MEPILEX BORDER 4X12 (GAUZE/BANDAGES/DRESSINGS) IMPLANT
DRSG MEPILEX BORDER 4X8 (GAUZE/BANDAGES/DRESSINGS) IMPLANT
DRSG VAC ATS MED SENSATRAC (GAUZE/BANDAGES/DRESSINGS) ×2 IMPLANT
DURAPREP 26ML APPLICATOR (WOUND CARE) ×3 IMPLANT
ELECT BLADE 6.5 EXT (BLADE) IMPLANT
ELECT CAUTERY BLADE 6.4 (BLADE) ×3 IMPLANT
ELECT REM PT RETURN 9FT ADLT (ELECTROSURGICAL) ×3
ELECTRODE REM PT RTRN 9FT ADLT (ELECTROSURGICAL) ×1 IMPLANT
GLOVE BIOGEL PI IND STRL 9 (GLOVE) ×1 IMPLANT
GLOVE BIOGEL PI INDICATOR 9 (GLOVE) ×2
GLOVE SURG ORTHO 9.0 STRL STRW (GLOVE) ×3 IMPLANT
GOWN STRL REUS W/ TWL XL LVL3 (GOWN DISPOSABLE) ×2 IMPLANT
GOWN STRL REUS W/TWL XL LVL3 (GOWN DISPOSABLE) ×6
HANDPIECE INTERPULSE COAX TIP (DISPOSABLE)
IMMOBILIZER KNEE 20 (SOFTGOODS) ×3
IMMOBILIZER KNEE 20 THIGH 36 (SOFTGOODS) IMPLANT
KIT BASIN OR (CUSTOM PROCEDURE TRAY) ×3 IMPLANT
KIT ROOM TURNOVER OR (KITS) ×3 IMPLANT
MANIFOLD NEPTUNE II (INSTRUMENTS) ×3 IMPLANT
NS IRRIG 1000ML POUR BTL (IV SOLUTION) ×3 IMPLANT
PACK TOTAL JOINT (CUSTOM PROCEDURE TRAY) ×3 IMPLANT
PACK UNIVERSAL I (CUSTOM PROCEDURE TRAY) ×3 IMPLANT
PAD ARMBOARD 7.5X6 YLW CONV (MISCELLANEOUS) ×6 IMPLANT
PRESSURIZER FEMORAL UNIV (MISCELLANEOUS) IMPLANT
PREVENA INCISION MGT 90 150 (MISCELLANEOUS) ×2 IMPLANT
SET HNDPC FAN SPRY TIP SCT (DISPOSABLE) IMPLANT
STAPLER VISISTAT 35W (STAPLE) ×3 IMPLANT
SUT ETHIBOND NAB CT1 #1 30IN (SUTURE) ×3 IMPLANT
SUT VIC AB 0 CT1 27 (SUTURE) ×6
SUT VIC AB 0 CT1 27XBRD ANBCTR (SUTURE) ×2 IMPLANT
SUT VIC AB 1 CTX 36 (SUTURE) ×3
SUT VIC AB 1 CTX36XBRD ANBCTR (SUTURE) ×1 IMPLANT
SUT VIC AB 2-0 CTB1 (SUTURE) ×6 IMPLANT
TOWEL OR 17X24 6PK STRL BLUE (TOWEL DISPOSABLE) ×1 IMPLANT
TOWEL OR 17X26 10 PK STRL BLUE (TOWEL DISPOSABLE) ×3 IMPLANT
TOWER CARTRIDGE SMART MIX (DISPOSABLE) IMPLANT
TRAY CATH 16FR W/PLASTIC CATH (SET/KITS/TRAYS/PACK) IMPLANT
TRAY FOLEY W/METER SILVER 16FR (SET/KITS/TRAYS/PACK) IMPLANT
WATER STERILE IRR 1000ML POUR (IV SOLUTION) ×3 IMPLANT

## 2016-06-05 NOTE — Anesthesia Procedure Notes (Signed)
Procedure Name: Intubation Date/Time: 06/05/2016 12:35 PM Performed by: Izora Gala Pre-anesthesia Checklist: Patient identified, Emergency Drugs available, Suction available and Patient being monitored Patient Re-evaluated:Patient Re-evaluated prior to inductionOxygen Delivery Method: Circle system utilized Preoxygenation: Pre-oxygenation with 100% oxygen Intubation Type: IV induction Ventilation: Mask ventilation without difficulty Laryngoscope Size: Miller and 3 Grade View: Grade I Tube type: Oral Tube size: 7.0 mm Number of attempts: 1 Airway Equipment and Method: Stylet and LTA kit utilized Placement Confirmation: ETT inserted through vocal cords under direct vision,  positive ETCO2 and breath sounds checked- equal and bilateral Secured at: 21 cm Dental Injury: Teeth and Oropharynx as per pre-operative assessment

## 2016-06-05 NOTE — Anesthesia Preprocedure Evaluation (Addendum)
Anesthesia Evaluation  Patient identified by MRN, date of birth, ID band Patient unresponsive    Reviewed: Allergy & Precautions, NPO status , Patient's Chart, lab work & pertinent test results, reviewed documented beta blocker date and time   History of Anesthesia Complications Negative for: history of anesthetic complications  Airway Mallampati: III  TM Distance: >3 FB Neck ROM: Full    Dental  (+) Teeth Intact, Dental Advisory Given,    Pulmonary    breath sounds clear to auscultation       Cardiovascular hypertension, Pt. on medications and Pt. on home beta blockers + CAD and + Cardiac Stents   Rhythm:Regular Rate:Normal  ECG: NSR, rate 67 ECHO:- Left ventricle: The cavity size was normal. There was mild concentric hypertrophy. Systolic function was vigorous. The estimated ejection fraction was in the range of 65% to 70%. Wall motion was normal; there were no regional wall motion abnormalities. Doppler parameters are consistent with abnormal left ventricular relaxation (grade 1 diastolic dysfunction). There was no evidence of elevated ventricular filling pressure by Doppler parameters. - Aortic valve: Trileaflet; normal thickness leaflets. There was no regurgitation. - Aortic root: The aortic root was normal in size. - Mitral valve: Calcified annulus. Mildly thickened leaflets .   There was no regurgitation. - Left atrium: The atrium was normal in size. - Right ventricle: The cavity size was normal. Wall thickness was normal. Systolic function was normal. - Tricuspid valve: There was trivial regurgitation. - Pulmonary arteries: Systolic pressure was within the normal range. - Inferior vena cava: The vessel was normal in size. - Pericardium, extracardiac: There was no pericardial effusion.   Neuro/Psych Left side weakness CVA, Residual Symptoms    GI/Hepatic IBS   Endo/Other    Renal/GU      Musculoskeletal   Abdominal   Peds  Hematology   Anesthesia Other Findings   Reproductive/Obstetrics                            Anesthesia Physical  Anesthesia Plan  ASA: III  Anesthesia Plan: General   Post-op Pain Management:    Induction: Intravenous  Airway Management Planned: LMA  Additional Equipment:   Intra-op Plan:   Post-operative Plan:   Informed Consent: I have reviewed the patients History and Physical, chart, labs and discussed the procedure including the risks, benefits and alternatives for the proposed anesthesia with the patient or authorized representative who has indicated his/her understanding and acceptance.   Dental advisory given  Plan Discussed with: CRNA and Surgeon  Anesthesia Plan Comments:        Anesthesia Quick Evaluation

## 2016-06-05 NOTE — Transfer of Care (Signed)
Immediate Anesthesia Transfer of Care Note  Patient: Jessica Booth  Procedure(s) Performed: Procedure(s): OPEN REDUCTION INTERNAL FIXATION (ORIF) LEFT PERIPROSTHETIC FEMUR FRACTURE (Left)  Patient Location: PACU  Anesthesia Type:General  Level of Consciousness: awake, oriented and patient cooperative  Airway & Oxygen Therapy: Patient Spontanous Breathing and Patient connected to face mask oxygen  Post-op Assessment: Report given to RN, Post -op Vital signs reviewed and stable and Patient moving all extremities  Post vital signs: Reviewed and stable  Last Vitals:  Vitals:   06/04/16 2317 06/05/16 0328  BP: 126/65 (!) 167/73  Pulse: 60 67  Resp: 12 16  Temp: 37 C 36.7 C    Last Pain:  Vitals:   06/05/16 1000  TempSrc:   PainSc: Asleep      Patients Stated Pain Goal: 1 (06/04/16 16100628)  Complications: No apparent anesthesia complications

## 2016-06-05 NOTE — Addendum Note (Signed)
Addendum  created 06/05/16 1637 by Coralee RudFlores, Bennye Nix, CRNA   Anesthesia Event edited

## 2016-06-05 NOTE — Anesthesia Postprocedure Evaluation (Signed)
Anesthesia Post Note  Patient: Jessica Booth  Procedure(s) Performed: Procedure(s) (LRB): OPEN REDUCTION INTERNAL FIXATION (ORIF) LEFT PERIPROSTHETIC FEMUR FRACTURE (Left)  Patient location during evaluation: PACU Anesthesia Type: General Level of consciousness: awake and alert Pain management: pain level controlled Vital Signs Assessment: post-procedure vital signs reviewed and stable Respiratory status: spontaneous breathing, nonlabored ventilation, respiratory function stable and patient connected to nasal cannula oxygen Cardiovascular status: blood pressure returned to baseline and stable Postop Assessment: no signs of nausea or vomiting Anesthetic complications: no       Last Vitals:  Vitals:   06/05/16 1425 06/05/16 1543  BP:  124/66  Pulse:  75  Resp:  15  Temp: 36.5 C 37 C    Last Pain:  Vitals:   06/05/16 1543  TempSrc: Axillary  PainSc:                  Catheryn Baconyan P Manna Gose

## 2016-06-05 NOTE — H&P (View-Only) (Signed)
ORTHOPAEDIC CONSULTATION  REQUESTING PHYSICIAN: Kathlen Mody, MD  Chief Complaint: Left hip pain.  HPI: Jessica Booth is a 83 y.o. female who presents with acute left hip pain after a fall onto her left hip when getting up in the bathroom.  Past Medical History:  Diagnosis Date  . Abnormal liver function test   . Coronary artery disease 2004   stent RCA  . Hyperlipidemia   . Hypertension   . Irritable bowel syndrome   . Stroke [facility])    Past Surgical History:  Procedure Laterality Date  . FEMUR FRACTURE SURGERY    . HIP ARTHROPLASTY Left 03/27/2016   Procedure: ARTHROPLASTY BIPOLAR HIP (HEMIARTHROPLASTY);  Surgeon: Nadara Mustard, MD;  Location: [facility] OR;  Service: Orthopedics;  Laterality: Left;   Social History   Social History  . Marital status: Married    Spouse name: N/A  . Number of children: N/A  . Years of education: N/A   Occupational History  . retired    Social History Main Topics  . Smoking status: Never Smoker  . Smokeless tobacco: Never Used  . Alcohol use Yes     Comment: beer rarely  . Drug use: Unknown  . Sexual activity: Not Asked   Other Topics Concern  . None   Social History Narrative  . None   Family History  Problem Relation Age of Onset  . Hypertension Sister    - negative except otherwise stated in the family history section Allergies  Allergen Reactions  . Penicillins Hives and Rash   Prior to Admission medications   Medication Sig Start Date End Date Taking? Authorizing Provider  acetaminophen (TYLENOL) 325 MG tablet Take 2 tablets (650 mg total) by mouth every 6 (six) hours as needed for mild pain (or Fever >/= 101). Patient taking differently: Take 500 mg by mouth every 6 (six) hours.  03/30/16  Yes Osvaldo Shipper, MD  citalopram (CELEXA) 20 MG tablet Take 20 mg by mouth daily.   Yes [provider]  clopidogrel (PLAVIX) 75 MG tablet Take 1 tablet (75 mg total) by mouth daily. 03/30/16  Yes Osvaldo Shipper, MD    clotrimazole (MYCELEX) 10 MG troche Take 10 mg by mouth daily as needed (yeast).   Yes [provider]  feeding supplement, ENSURE ENLIVE, (ENSURE ENLIVE) LIQD Take 237 mLs by mouth 2 (two) times daily between meals. 03/30/16  Yes Osvaldo Shipper, MD  loperamide (IMODIUM) 2 MG capsule Take 2 mg by mouth every 6 (six) hours as needed for diarrhea or loose stools.   Yes [provider]  meclizine (ANTIVERT) 12.5 MG tablet Take 1 tablet (12.5 mg total) by mouth 2 (two) times daily as needed for dizziness. 03/30/16  Yes Osvaldo Shipper, MD  metoprolol tartrate (LOPRESSOR) 25 MG tablet Take 25 mg by mouth 2 (two) times daily.   Yes [provider]  senna (SENOKOT) 8.6 MG TABS tablet Take 1 tablet by mouth daily as needed for mild constipation.   Yes [provider]  enoxaparin (LOVENOX) 30 MG/0.3ML injection Inject 0.3 mLs (30 mg total) into the skin daily. For 2 weeks. 03/30/16 04/13/16  Osvaldo Shipper, MD   Ct Head Wo Contrast  Result Date: 06/03/2016 CLINICAL DATA:  Patient fell without syncope. EXAM: CT HEAD WITHOUT CONTRAST CT CERVICAL SPINE WITHOUT CONTRAST TECHNIQUE: Multidetector CT imaging of the head and cervical spine was performed following the standard protocol without intravenous contrast. Multiplanar CT image reconstructions of the cervical spine were also  generated. COMPARISON:  05/04/2016 FINDINGS: CT HEAD FINDINGS Brain: Moderate degree of superficial and central atrophy appears chronic and stable with moderate-to-marked periventricular, subcortical and deep white matter are areas of hypodensity consistent with chronic microvascular ischemia. No acute intracranial hemorrhage. No intra-axial mass nor extra-axial fluid collections. Idiopathic basal ganglial calcifications are noted bilaterally. No effacement of the basal cisterns. The fourth ventricle is midline. There is no hydrocephalus. Vascular: No hyperdense vessel or unexpected calcification. Skull:  Negative for fracture. Small osteoma in the right frontal sinus. Sinuses/Orbits: The mastoids are clear. Moderate circumferential mucosal thickening is seen of the sphenoid sinus on the left. Mild mucosal thickening of the ethmoid sinus. Intact orbits and globes with bilateral lens replacements surgeries. Other: None CT CERVICAL SPINE FINDINGS Alignment: Normal cervical lordosis. Skull base and vertebrae: No acute fracture of the vertebral bodies or primary bone lesion. Soft tissues and spinal canal: No prevertebral soft tissue swelling. No visible canal hematoma. Disc levels: Moderate disc space narrowing C4 through C7 with small posterior marginal osteophytes contributing to mild to moderate bilateral neural foraminal encroachment at C4-5 and C5-6. Upper chest: Right apical calcified pleural plaque. Other:  Atherosclerosis of the extracranial carotids. IMPRESSION: 1. Cerebral atrophy with chronic moderate to marked small vessel ischemic disease. No acute intracranial abnormality. 2. Cervical spondylosis without acute posttraumatic fracture nor subluxation. Electronically Signed   By: Tollie Ethavid  Kwon M.D.   On: 06/03/2016 17:41   Ct Cervical Spine Wo Contrast  Result Date: 06/03/2016 CLINICAL DATA:  Patient fell without syncope. EXAM: CT HEAD WITHOUT CONTRAST CT CERVICAL SPINE WITHOUT CONTRAST TECHNIQUE: Multidetector CT imaging of the head and cervical spine was performed following the standard protocol without intravenous contrast. Multiplanar CT image reconstructions of the cervical spine were also generated. COMPARISON:  05/04/2016 FINDINGS: CT HEAD FINDINGS Brain: Moderate degree of superficial and central atrophy appears chronic and stable with moderate-to-marked periventricular, subcortical and deep white matter are areas of hypodensity consistent with chronic microvascular ischemia. No acute intracranial hemorrhage. No intra-axial mass nor extra-axial fluid collections. Idiopathic basal ganglial  calcifications are noted bilaterally. No effacement of the basal cisterns. The fourth ventricle is midline. There is no hydrocephalus. Vascular: No hyperdense vessel or unexpected calcification. Skull: Negative for fracture. Small osteoma in the right frontal sinus. Sinuses/Orbits: The mastoids are clear. Moderate circumferential mucosal thickening is seen of the sphenoid sinus on the left. Mild mucosal thickening of the ethmoid sinus. Intact orbits and globes with bilateral lens replacements surgeries. Other: None CT CERVICAL SPINE FINDINGS Alignment: Normal cervical lordosis. Skull base and vertebrae: No acute fracture of the vertebral bodies or primary bone lesion. Soft tissues and spinal canal: No prevertebral soft tissue swelling. No visible canal hematoma. Disc levels: Moderate disc space narrowing C4 through C7 with small posterior marginal osteophytes contributing to mild to moderate bilateral neural foraminal encroachment at C4-5 and C5-6. Upper chest: Right apical calcified pleural plaque. Other:  Atherosclerosis of the extracranial carotids. IMPRESSION: 1. Cerebral atrophy with chronic moderate to marked small vessel ischemic disease. No acute intracranial abnormality. 2. Cervical spondylosis without acute posttraumatic fracture nor subluxation. Electronically Signed   By: Tollie Ethavid  Kwon M.D.   On: 06/03/2016 17:41   Ct Hip Left Wo Contrast  Result Date: 06/03/2016 CLINICAL DATA:  Left hip pain. EXAM: CT OF THE LEFT HIP WITHOUT CONTRAST TECHNIQUE: Multidetector CT imaging of the left hip was performed according to the standard protocol. Multiplanar CT image reconstructions were also generated. COMPARISON:  Radiographs of same day. FINDINGS: Status post left  hip arthroplasty. Nondisplaced periprosthetic fracture is noted along the medial cortex of the femur in the trochanteric and subtrochanteric regions. No soft tissue abnormality is noted. IMPRESSION: Nondisplaced periprosthetic fracture is noted along  the medial cortex of the femur in the trochanteric and subtrochanteric regions. Electronically Signed   By: Lupita Raider, M.D.   On: 06/03/2016 20:42   Chest Portable 1 View  Result Date: 06/04/2016 CLINICAL DATA:  83 year old female with shortness of breath. Left hip periprostatic fracture. EXAM: PORTABLE CHEST 1 VIEW COMPARISON:  Chest radiograph dated 03/27/2016 FINDINGS: The lungs are clear. There is no pleural effusion or pneumothorax. The cardiac silhouette is within normal limits. Osteopenia. No acute osseous pathology. Right upper quadrant cholecystectomy clips. IMPRESSION: No active disease. Electronically Signed   By: Elgie Collard M.D.   On: 06/04/2016 00:16   Dg Hip Unilat W Or Wo Pelvis 2-3 Views Left  Result Date: 06/03/2016 CLINICAL DATA:  Pain after fall EXAM: DG HIP (WITH OR WITHOUT PELVIS) 2-3V LEFT COMPARISON:  03/27/2016 pelvic and preoperative views of the left hip. FINDINGS: On the frog-leg view of the left hip, there is a nondisplaced fracture lucency along the medial aspect of the sub trochanteric left femur not apparent on the prior comparison studies and therefore suspicious for a new fracture. No joint dislocations are identified. The bony pelvis appears intact. Partially imaged right femoral nail appears intact. There is lower lumbar degenerative disc disease at L4-5 and L5-S1 with associated facet arthropathy. IMPRESSION: 1. Nondisplaced fracture lucency is seen on the frog-leg view of the left hip along the medial subtrochanteric portion of the femur. This is not apparent on the prior comparison studies and is suspicious for new finding. 2. No acute pelvic fracture or joint dislocations. Electronically Signed   By: Tollie Eth M.D.   On: 06/03/2016 18:11   - pertinent xrays, CT, MRI studies were reviewed and independently interpreted  Positive ROS: All other systems have been reviewed and were otherwise negative with the exception of those mentioned in the HPI and as  above.  Physical Exam: General: Alert, no acute distress Psychiatric: Patient is competent for consent with normal mood and affect Lymphatic: No axillary or cervical lymphadenopathy Cardiovascular: No pedal edema Respiratory: No cyanosis, no use of accessory musculature GI: No organomegaly, abdomen is soft and non-tender  Skin:  Patient has no skin breakdown or lesions.   Neurologic: Patient does have protective sensation bilateral lower extremities.   MUSCULOSKELETAL:  Patient has pain with attempted range of motion of the left hip. Review of the radiographs and CT scan shows a nondisplaced proximal left femur fracture periprosthetic with a hemiarthroplasty present.  Assessment: Assessment: Periprosthetic left proximal femur fracture.  Plan: Plan: We'll plan for internal fixation of the periprosthetic femur fracture with cerclage wire. We will plan for surgery tomorrow Wednesday. Patient may eat today until midnight.  Thank you for the consult and the opportunity to see Ms. Josely Moffat, MD [facility] [phone] 7:01 AM

## 2016-06-05 NOTE — Op Note (Signed)
06/03/2016 - 06/05/2016  1:30 PM  PATIENT:  Jessica Booth    PRE-OPERATIVE DIAGNOSIS:  Periprosthetic Left Proximal Femur Fracture, with dislocated monopolar hemiarthroplasty  POST-OPERATIVE DIAGNOSIS:  Same  PROCEDURE:  OPEN REDUCTION INTERNAL FIXATION (ORIF) LEFT PERIPROSTHETIC FEMUR FRACTURE,  Revision monopolar hemiarthroplasty due to dislocated hip  Application Prevena wound VAC.  SURGEON:  Nadara MustardMarcus V Treniya Lobb, MD  PHYSICIAN ASSISTANT:None ANESTHESIA:   General  PREOPERATIVE INDICATIONS:  Jessica Booth is a  83 y.o. female with a diagnosis of Periprosthetic Left Proximal Femur Fracture who failed conservative measures and elected for surgical management.    The risks benefits and alternatives were discussed with the patient preoperatively including but not limited to the risks of infection, bleeding, nerve injury, cardiopulmonary complications, the need for revision surgery, among others, and the patient was willing to proceed.  OPERATIVE IMPLANTS: [place] monopolar hemiarthroplasty size 4 stem with size 47 head and size +0 neck  OPERATIVE FINDINGS: Stem dislocated from femoral canal due to the periprosthetic femur fracture.  OPERATIVE PROCEDURE: Patient is a 83 year old woman who fell at home getting off the toilet sustaining a periprosthetic femur fracture on the left. Patient had a nondisplaced fracture of the hip was reduced and patient was scheduled for open reduction internal fixation of the periprosthetic femur fracture. At presentation of surgery the periprosthetic femur fracture had displaced further causing a dissociation of the hemiarthroplasty and a dislocation of the hip. With manipulation the hip was not able to be reduced and the procedure was changed to revision of the hemiarthroplasty with the open reduction internal fixation of the periprosthetic femur fracture.  Patient was brought to the operating room and underwent a general anesthetic. After adequate levels  anesthesia obtained patient's placed in boot traction. At this time is identified the patient's femoral stem had dissociated from the femur due to the periprosthetic femur fracture. Attempted closed reduction was performed and this was unsuccessful. Patient was then transferred to the standard operating table placed in the lateral to position with the left side up and the left lower extremity was prepped using DuraPrep draped into a sterile field a timeout was called. Her previous posterior lateral incision was used and this was carried down through the tensor fascia lata which was split. The femoral stem component was removed the second completely dissociated from the femur. The wound was irrigated with normal saline there is no signs of infection. Using the cerclage cables 2 cables were placed around the proximal femur and the femoral fracture was reduced. The hip was then sequentially broached to a size 4 stem. This was stable. The hip was reduced with the 47 mm ball and size 4 stem the hip was stable. The wound was again irrigated with normal saline throughout the case. The tensor fascia lata was closed using 2 Vicryl the subcutaneous is closed using 2-0 nylon in the skin was closed using staples. A Prevena wound VAC was applied patient was extubated taken to the PACU in stable condition.

## 2016-06-05 NOTE — Interval H&P Note (Signed)
History and Physical Interval Note:  06/05/2016 6:43 AM  Jessica Booth  has presented today for surgery, with the diagnosis of Periprosthetic Left Proximal Femur Fracture  The various methods of treatment have been discussed with the patient and family. After consideration of risks, benefits and other options for treatment, the patient has consented to  Procedure(s): OPEN REDUCTION INTERNAL FIXATION (ORIF) LEFT PERIPROSTHETIC FEMUR FRACTURE (Left) as a surgical intervention .  The patient's history has been reviewed, patient examined, no change in status, stable for surgery.  I have reviewed the patient's chart and labs.  Questions were answered to the patient's satisfaction.     Nadara MustardMarcus V Dayanis Bergquist

## 2016-06-05 NOTE — Progress Notes (Signed)
PROGRESS NOTE    Jessica Booth  ZOX:[id number] DOB: [birth year] DOA: 06/03/2016 PCP: Vivien Presto, MD    Brief Narrative: Jessica Booth is a 83 y.o. female with medical history significant of coronary artery disease status post stenting, hypertension, and left hip fracture in 3/17 requiring left hip hemiarthroplasty. She fell today and landed on the elft hip.  She was found to have left periprosthetic hip fracture. She was admitted for surgical repair.   Assessment & Plan:   Left periprosthetic hip fracture -Orthopedics consulted -plan for OR today -start Lovenox for DVt prophylaxis post op -will need SNF for rehab  S/p CVA with residual left arm weakness -This appears to have occurred during prior hospitalization in 3/18 -Will need to resume Plavix as soon as possible  HTN -Continue Lopressor  CAD -Continue Lipitor, metoprolol, plavix  DVT prophylaxis:  start lovenox tomorrow Code Status: DNR Family Communication: None present Disposition Plan:  Likely to need SNF rehab once clinically improved   Consultants:   Dr Lajoyce Corners.   Procedures: none.   Antimicrobials: none   Subjective: Pain relatively controlled  Objective: Vitals:   06/05/16 1414 06/05/16 1415 06/05/16 1423 06/05/16 1425  BP: (!) 146/67     Pulse: 68 73 75   Resp: 12 13 13    Temp:    97.7 F (36.5 C)  TempSrc:      SpO2: 95% 100% 100%   Weight:      Height:        Intake/Output Summary (Last 24 hours) at 06/05/16 1458 Last data filed at 06/05/16 1427  Gross per 24 hour  Intake             1175 ml  Output              100 ml  Net             1075 ml   Filed Weights   06/03/16 1626 06/04/16 0006  Weight: 52.2 kg (115 lb) 46.7 kg (103 lb)    Examination:  General exam: Appears calm and comfortable  Respiratory system: Clear to auscultation. Respiratory effort normal. Cardiovascular system: S1 & S2 heard, RRR. No JVD, murmurs, rubs Gastrointestinal system: Abdomen is  nondistended, soft and nontender. Normal bowel sounds heard. Central nervous system: Alert and confused. . No focal neurological deficits. Extremities: left hip tenderness, leg shortened and ext rotated Skin: No rashes, lesions or ulcers     Data Reviewed: I have personally reviewed following labs and imaging studies  CBC:  Recent Labs Lab 06/03/16 1737  WBC 10.7*  NEUTROABS 7.2  HGB 13.0  HCT 38.0  MCV 94.5  PLT 228   Basic Metabolic Panel:  Recent Labs Lab 06/03/16 1737  NA 138  K 4.4  CL 106  CO2 21*  GLUCOSE 98  BUN 22*  CREATININE 1.03*  CALCIUM 9.6   GFR: Estimated Creatinine Clearance: 25.7 mL/min (A) (by C-G formula based on SCr of 1.03 mg/dL (H)). Liver Function Tests:  Recent Labs Lab 06/03/16 1737  AST 35  ALT 25  ALKPHOS 64  BILITOT 0.5  PROT 6.9  ALBUMIN 4.3   No results for input(s): LIPASE, AMYLASE in the last 168 hours. No results for input(s): AMMONIA in the last 168 hours. Coagulation Profile: No results for input(s): INR, PROTIME in the last 168 hours. Cardiac Enzymes: No results for input(s): CKTOTAL, CKMB, CKMBINDEX, TROPONINI in the last 168 hours. BNP (last 3 results) No results for input(s): PROBNP in  the last 8760 hours. HbA1C: No results for input(s): HGBA1C in the last 72 hours. CBG: No results for input(s): GLUCAP in the last 168 hours. Lipid Profile: No results for input(s): CHOL, HDL, LDLCALC, TRIG, CHOLHDL, LDLDIRECT in the last 72 hours. Thyroid Function Tests: No results for input(s): TSH, T4TOTAL, FREET4, T3FREE, THYROIDAB in the last 72 hours. Anemia Panel: No results for input(s): VITAMINB12, FOLATE, FERRITIN, TIBC, IRON, RETICCTPCT in the last 72 hours. Sepsis Labs: No results for input(s): PROCALCITON, LATICACIDVEN in the last 168 hours.  Recent Results (from the past 240 hour(s))  Surgical PCR screen     Status: None   Collection Time: 06/04/16  3:22 AM  Result Value Ref Range Status   MRSA, PCR NEGATIVE  NEGATIVE Final   Staphylococcus aureus NEGATIVE NEGATIVE Final    Comment:        The Xpert SA Assay (FDA approved for NASAL specimens in patients over 33 years of age), is one component of a comprehensive surveillance program.  Test performance has been validated by [facility] for patients greater than or equal to 20 year old. It is not intended to diagnose infection nor to guide or monitor treatment.          Radiology Studies: Ct Head Wo Contrast  Result Date: 06/03/2016 CLINICAL DATA:  Patient fell without syncope. EXAM: CT HEAD WITHOUT CONTRAST CT CERVICAL SPINE WITHOUT CONTRAST TECHNIQUE: Multidetector CT imaging of the head and cervical spine was performed following the standard protocol without intravenous contrast. Multiplanar CT image reconstructions of the cervical spine were also generated. COMPARISON:  05/04/2016 FINDINGS: CT HEAD FINDINGS Brain: Moderate degree of superficial and central atrophy appears chronic and stable with moderate-to-marked periventricular, subcortical and deep white matter are areas of hypodensity consistent with chronic microvascular ischemia. No acute intracranial hemorrhage. No intra-axial mass nor extra-axial fluid collections. Idiopathic basal ganglial calcifications are noted bilaterally. No effacement of the basal cisterns. The fourth ventricle is midline. There is no hydrocephalus. Vascular: No hyperdense vessel or unexpected calcification. Skull: Negative for fracture. Small osteoma in the right frontal sinus. Sinuses/Orbits: The mastoids are clear. Moderate circumferential mucosal thickening is seen of the sphenoid sinus on the left. Mild mucosal thickening of the ethmoid sinus. Intact orbits and globes with bilateral lens replacements surgeries. Other: None CT CERVICAL SPINE FINDINGS Alignment: Normal cervical lordosis. Skull base and vertebrae: No acute fracture of the vertebral bodies or primary bone lesion. Soft tissues and spinal canal: No  prevertebral soft tissue swelling. No visible canal hematoma. Disc levels: Moderate disc space narrowing C4 through C7 with small posterior marginal osteophytes contributing to mild to moderate bilateral neural foraminal encroachment at C4-5 and C5-6. Upper chest: Right apical calcified pleural plaque. Other:  Atherosclerosis of the extracranial carotids. IMPRESSION: 1. Cerebral atrophy with chronic moderate to marked small vessel ischemic disease. No acute intracranial abnormality. 2. Cervical spondylosis without acute posttraumatic fracture nor subluxation. Electronically Signed   By: Tollie Eth M.D.   On: 06/03/2016 17:41   Ct Cervical Spine Wo Contrast  Result Date: 06/03/2016 CLINICAL DATA:  Patient fell without syncope. EXAM: CT HEAD WITHOUT CONTRAST CT CERVICAL SPINE WITHOUT CONTRAST TECHNIQUE: Multidetector CT imaging of the head and cervical spine was performed following the standard protocol without intravenous contrast. Multiplanar CT image reconstructions of the cervical spine were also generated. COMPARISON:  05/04/2016 FINDINGS: CT HEAD FINDINGS Brain: Moderate degree of superficial and central atrophy appears chronic and stable with moderate-to-marked periventricular, subcortical and deep white matter are areas of  hypodensity consistent with chronic microvascular ischemia. No acute intracranial hemorrhage. No intra-axial mass nor extra-axial fluid collections. Idiopathic basal ganglial calcifications are noted bilaterally. No effacement of the basal cisterns. The fourth ventricle is midline. There is no hydrocephalus. Vascular: No hyperdense vessel or unexpected calcification. Skull: Negative for fracture. Small osteoma in the right frontal sinus. Sinuses/Orbits: The mastoids are clear. Moderate circumferential mucosal thickening is seen of the sphenoid sinus on the left. Mild mucosal thickening of the ethmoid sinus. Intact orbits and globes with bilateral lens replacements surgeries. Other: None  CT CERVICAL SPINE FINDINGS Alignment: Normal cervical lordosis. Skull base and vertebrae: No acute fracture of the vertebral bodies or primary bone lesion. Soft tissues and spinal canal: No prevertebral soft tissue swelling. No visible canal hematoma. Disc levels: Moderate disc space narrowing C4 through C7 with small posterior marginal osteophytes contributing to mild to moderate bilateral neural foraminal encroachment at C4-5 and C5-6. Upper chest: Right apical calcified pleural plaque. Other:  Atherosclerosis of the extracranial carotids. IMPRESSION: 1. Cerebral atrophy with chronic moderate to marked small vessel ischemic disease. No acute intracranial abnormality. 2. Cervical spondylosis without acute posttraumatic fracture nor subluxation. Electronically Signed   By: Tollie Ethavid  Kwon M.D.   On: 06/03/2016 17:41   Ct Hip Left Wo Contrast  Result Date: 06/03/2016 CLINICAL DATA:  Left hip pain. EXAM: CT OF THE LEFT HIP WITHOUT CONTRAST TECHNIQUE: Multidetector CT imaging of the left hip was performed according to the standard protocol. Multiplanar CT image reconstructions were also generated. COMPARISON:  Radiographs of same day. FINDINGS: Status post left hip arthroplasty. Nondisplaced periprosthetic fracture is noted along the medial cortex of the femur in the trochanteric and subtrochanteric regions. No soft tissue abnormality is noted. IMPRESSION: Nondisplaced periprosthetic fracture is noted along the medial cortex of the femur in the trochanteric and subtrochanteric regions. Electronically Signed   By: Lupita RaiderJames  Green Jr, M.D.   On: 06/03/2016 20:42   Chest Portable 1 View  Result Date: 06/04/2016 CLINICAL DATA:  83 year old female with shortness of breath. Left hip periprostatic fracture. EXAM: PORTABLE CHEST 1 VIEW COMPARISON:  Chest radiograph dated 03/27/2016 FINDINGS: The lungs are clear. There is no pleural effusion or pneumothorax. The cardiac silhouette is within normal limits. Osteopenia. No acute  osseous pathology. Right upper quadrant cholecystectomy clips. IMPRESSION: No active disease. Electronically Signed   By: Elgie CollardArash  Radparvar M.D.   On: 06/04/2016 00:16   Dg C-arm 1-60 Min-no Report  Result Date: 06/05/2016 Fluoroscopy was utilized by the requesting physician.  No radiographic interpretation.   Dg Hip Unilat W Or Wo Pelvis 2-3 Views Left  Result Date: 06/03/2016 CLINICAL DATA:  Pain after fall EXAM: DG HIP (WITH OR WITHOUT PELVIS) 2-3V LEFT COMPARISON:  03/27/2016 pelvic and preoperative views of the left hip. FINDINGS: On the frog-leg view of the left hip, there is a nondisplaced fracture lucency along the medial aspect of the sub trochanteric left femur not apparent on the prior comparison studies and therefore suspicious for a new fracture. No joint dislocations are identified. The bony pelvis appears intact. Partially imaged right femoral nail appears intact. There is lower lumbar degenerative disc disease at L4-5 and L5-S1 with associated facet arthropathy. IMPRESSION: 1. Nondisplaced fracture lucency is seen on the frog-leg view of the left hip along the medial subtrochanteric portion of the femur. This is not apparent on the prior comparison studies and is suspicious for new finding. 2. No acute pelvic fracture or joint dislocations. Electronically Signed   By: Tollie Ethavid  Kwon  M.D.   On: 06/03/2016 18:11        Scheduled Meds: . Melene Muller ON 06/06/2016] aspirin EC  325 mg Oral Q breakfast  . citalopram  20 mg Oral Daily  . feeding supplement (ENSURE ENLIVE)  237 mL Oral BID BM  . metoprolol tartrate  25 mg Oral BID   Continuous Infusions: . lactated ringers 10 mL/hr at 06/05/16 1135     LOS: 2 days    Time spent: 25 minutes.     Zannie Cove, MD [facility] Pager [phone]  If 7PM-7AM, please contact night-coverage [email or web address] Password [email or web address] 06/05/2016, 2:58 PM

## 2016-06-06 ENCOUNTER — Encounter (HOSPITAL_COMMUNITY): Payer: Self-pay | Admitting: Orthopedic Surgery

## 2016-06-06 DIAGNOSIS — M80059G Age-related osteoporosis with current pathological fracture, unspecified femur, subsequent encounter for fracture with delayed healing: Secondary | ICD-10-CM

## 2016-06-06 LAB — BASIC METABOLIC PANEL
Anion gap: 11 (ref 5–15)
BUN: 28 mg/dL — AB (ref 6–20)
CALCIUM: 8.6 mg/dL — AB (ref 8.9–10.3)
CHLORIDE: 105 mmol/L (ref 101–111)
CO2: 20 mmol/L — ABNORMAL LOW (ref 22–32)
CREATININE: 1.13 mg/dL — AB (ref 0.44–1.00)
GFR, EST AFRICAN AMERICAN: 47 mL/min — AB (ref >60)
GFR, EST NON AFRICAN AMERICAN: 41 mL/min — AB (ref >60)
Glucose, Bld: 128 mg/dL — ABNORMAL HIGH (ref 65–99)
Potassium: 4.5 mmol/L (ref 3.5–5.1)
SODIUM: 136 mmol/L (ref 135–145)

## 2016-06-06 LAB — CBC
HCT: 29.1 % — ABNORMAL LOW (ref 36.0–46.0)
Hemoglobin: 9.9 g/dL — ABNORMAL LOW (ref 12.0–15.0)
MCH: 32.6 pg (ref 26.0–34.0)
MCHC: 34 g/dL (ref 30.0–36.0)
MCV: 95.7 fL (ref 78.0–100.0)
PLATELETS: 201 10*3/uL (ref 150–400)
RBC: 3.04 MIL/uL — ABNORMAL LOW (ref 3.87–5.11)
RDW: 12.7 % (ref 11.5–15.5)
WBC: 13.3 10*3/uL — ABNORMAL HIGH (ref 4.0–10.5)

## 2016-06-06 MED ORDER — ENOXAPARIN SODIUM 30 MG/0.3ML ~~LOC~~ SOLN
30.0000 mg | SUBCUTANEOUS | Status: DC
  Administered 2016-06-06 – 2016-06-10 (×5): 30 mg via SUBCUTANEOUS
  Filled 2016-06-06 (×5): qty 0.3

## 2016-06-06 MED ORDER — CLOPIDOGREL BISULFATE 75 MG PO TABS
75.0000 mg | ORAL_TABLET | Freq: Every day | ORAL | Status: DC
  Administered 2016-06-06 – 2016-06-10 (×5): 75 mg via ORAL
  Filled 2016-06-06 (×5): qty 1

## 2016-06-06 MED ORDER — ACETAMINOPHEN 500 MG PO TABS: 500.0000 mg | ORAL_TABLET | Freq: Four times a day (QID) | ORAL | 0 refills | Status: AC | PRN

## 2016-06-06 MED ORDER — LACTATED RINGERS IV SOLN: INTRAVENOUS | Status: AC

## 2016-06-06 MED ORDER — ASPIRIN EC 81 MG PO TBEC: 81.0000 mg | DELAYED_RELEASE_TABLET | Freq: Every day | ORAL | 1 refills | Status: DC

## 2016-06-06 NOTE — Progress Notes (Signed)
PT Cancellation Note  Patient Details Name: Lucia GaskinsDorothy R. Bingaman MRN: [id number] DOB: [birth year]   Cancelled Treatment:    Reason Eval/Treat Not Completed: Patient not medically ready Pt on bedrest. Will await increase in activity orders prior to PT evaluation.   Blake DivineShauna A Aela Bohan 06/06/2016, 8:30 AM Mylo RedShauna Lota Leamer, PT, DPT [phone]

## 2016-06-06 NOTE — Progress Notes (Signed)
PROGRESS NOTE    Jessica GaskinsDorothy R. Altergott  ZOX:[id number] DOB: 02/17/1922 DOA: 06/03/2016 PCP: Vivien Prestoorrington, Kip A, MD    Brief Narrative: Jessica Gaskinsorothy R. Gali is a 83 y.o. female with medical history significant of coronary artery disease status post stenting, hypertension, and left hip fracture in 3/17 requiring left hip hemiarthroplasty. She fell today and landed on the left hip.  She was found to have left periprosthetic hip fracture. She was admitted for surgical repair.   Assessment & Plan:   Left periprosthetic hip fracture -Orthopedics consulted -s/p ORIF 5/30 -start Lovenox for DVt prophylaxis post op -will need SNF for rehab  S/p CVA with residual left arm weakness -This appears to have occurred during prior hospitalization in 3/18 -restarted plavix  Acute Blood loss anemia -monitor Hb, no bleeding, most of drop related to blood loss from OR and dilution  HTN -Continue Lopressor  CAD -Continue Lipitor, metoprolol, plavix  ? Dementia vs Cognitive impairment -suspect this based on my patient interactions over last 2days  DVT prophylaxis:  lovenox Code Status: DNR Family Communication: None present Disposition Plan:  SNF in 1-2days   Consultants:   Dr Lajoyce Cornersuda.  Procedures: 5/30: Revision monopolar hemiarthroplasty due to dislocated hip Application Prevena wound VAC.  Antimicrobials: none   Subjective: Pain relatively controlled, talks abt us wanting to cut her legs off  Objective: Vitals:   06/05/16 1425 06/05/16 1543 06/05/16 2059 06/06/16 0532  BP:  124/66 125/74 99/62  Pulse:  75 (!) 105 (!) 106  Resp:  15 15 16   Temp: 97.7 F (36.5 C) 98.6 F (37 C) 98.4 F (36.9 C) 98.5 F (36.9 C)  TempSrc:  Axillary Oral Axillary  SpO2:  92% 95% 96%  Weight:      Height:        Intake/Output Summary (Last 24 hours) at 06/06/16 1316 Last data filed at 06/05/16 2300  Gross per 24 hour  Intake              195 ml  Output              100 ml  Net               95  ml   Filed Weights   06/03/16 1626 06/04/16 0006  Weight: 52.2 kg (115 lb) 46.7 kg (103 lb)    Examination:  General exam: Appears calm and comfortable, oriented to self, place Respiratory system: Clear to auscultation. Respiratory effort normal. Cardiovascular system: S1 & S2 heard, RRR. No JVD, murmurs, rubs Gastrointestinal system: Abdomen is nondistended, soft and nontender. Normal bowel sounds heard. Central nervous system: Alert and confused. . No focal neurological deficits. Extremities: left hip tenderness, leg shortened and ext rotated, dressing and wound vac Skin: No rashes, lesions or ulcers     Data Reviewed: I have personally reviewed following labs and imaging studies  CBC:  Recent Labs Lab 06/03/16 1737 06/06/16 0701  WBC 10.7* 13.3*  NEUTROABS 7.2  --   HGB 13.0 9.9*  HCT 38.0 29.1*  MCV 94.5 95.7  PLT 228 201   Basic Metabolic Panel:  Recent Labs Lab 06/03/16 1737 06/06/16 0701  NA 138 136  K 4.4 4.5  CL 106 105  CO2 21* 20*  GLUCOSE 98 128*  BUN 22* 28*  CREATININE 1.03* 1.13*  CALCIUM 9.6 8.6*   GFR: Estimated Creatinine Clearance: 23.4 mL/min (A) (by C-G formula based on SCr of 1.13 mg/dL (H)). Liver Function Tests:  Recent Labs Lab 06/03/16 1737  AST 35  ALT 25  ALKPHOS 64  BILITOT 0.5  PROT 6.9  ALBUMIN 4.3   No results for input(s): LIPASE, AMYLASE in the last 168 hours. No results for input(s): AMMONIA in the last 168 hours. Coagulation Profile: No results for input(s): INR, PROTIME in the last 168 hours. Cardiac Enzymes: No results for input(s): CKTOTAL, CKMB, CKMBINDEX, TROPONINI in the last 168 hours. BNP (last 3 results) No results for input(s): PROBNP in the last 8760 hours. HbA1C: No results for input(s): HGBA1C in the last 72 hours. CBG: No results for input(s): GLUCAP in the last 168 hours. Lipid Profile: No results for input(s): CHOL, HDL, LDLCALC, TRIG, CHOLHDL, LDLDIRECT in the last 72 hours. Thyroid  Function Tests: No results for input(s): TSH, T4TOTAL, FREET4, T3FREE, THYROIDAB in the last 72 hours. Anemia Panel: No results for input(s): VITAMINB12, FOLATE, FERRITIN, TIBC, IRON, RETICCTPCT in the last 72 hours. Sepsis Labs: No results for input(s): PROCALCITON, LATICACIDVEN in the last 168 hours.  Recent Results (from the past 240 hour(s))  Surgical PCR screen     Status: None   Collection Time: 06/04/16  3:22 AM  Result Value Ref Range Status   MRSA, PCR NEGATIVE NEGATIVE Final   Staphylococcus aureus NEGATIVE NEGATIVE Final    Comment:        The Xpert SA Assay (FDA approved for NASAL specimens in patients over 56 years of age), is one component of a comprehensive surveillance program.  Test performance has been validated by [facility] for patients greater than or equal to 20 year old. It is not intended to diagnose infection nor to guide or monitor treatment.          Radiology Studies: Pelvis Portable  Result Date: 06/05/2016 CLINICAL DATA:  Status post left hip replacement. EXAM: PORTABLE PELVIS 1-2 VIEWS COMPARISON:  Left hip CT dated 06/03/2016. Pelvis radiograph dated 03/27/2016. FINDINGS: Stable left hip placement with interval cerclage wires at the inferior intertrochanteric and subtrochanteric regions. The recently demonstrated fracture at that level is not visible. Stable hardware fixation of the right femur and hip. Diffuse osteopenia. No new fracture or dislocation. IMPRESSION: Interval cerclage wire placement at the level of the recently demonstrated proximal left femur fracture. Electronically Signed   By: Beckie Salts M.D.   On: 06/05/2016 16:09   Dg C-arm 1-60 Min-no Report  Result Date: 06/05/2016 Fluoroscopy was utilized by the requesting physician.  No radiographic interpretation.        Scheduled Meds: . citalopram  20 mg Oral Daily  . clopidogrel  75 mg Oral Daily  . enoxaparin (LOVENOX) injection  30 mg Subcutaneous Q24H  . feeding  supplement (ENSURE ENLIVE)  237 mL Oral BID BM  . metoprolol tartrate  25 mg Oral BID   Continuous Infusions: . lactated ringers 50 mL/hr at 06/06/16 1036     LOS: 3 days    Time spent: 25 minutes.     Zannie Cove, MD [facility] Pager [phone]  If 7PM-7AM, please contact night-coverage [email or web address] Password [email or web address] 06/06/2016, 1:16 PM

## 2016-06-06 NOTE — Progress Notes (Signed)
[name], female   DOB: 04/07/1922, 83 y.o.   MRN: [id number] Postop day 1 internal fixation for periprosthetic femur fracture and revision of the hemiarthroplasty. Patient without complaints this morning. Plan for physical therapy weightbearing as tolerated use the knee immobilizer to decrease risk for dislocation. Plan for discharge to skilled nursing.

## 2016-06-06 NOTE — Progress Notes (Signed)
Occupational Therapy Treatment Patient Details Name: Jessica Booth MRN: [id number] DOB: [birth year] Today's Date: 06/06/2016    History of present illness Patient is a 83 y/o female who presents s/p internal fixation for periprosthetic femur fracture and revision of the hemiarthroplasty LLE after fall. PMH includes CAD s/p stening, HTN, CVA.   OT comments  This 83 yo female admitted and underwent above seen for second session today to work on transfers back to bed with A of nurse tech. Pt still limited by pain, fear of falling, and generalized weakness. She will continue to benefit from acute OT with follow up at SNF.  Follow Up Recommendations  SNF;Supervision/Assistance - 24 hour    Equipment Recommendations  Other (comment) (TBD at next visit)       Precautions / Restrictions Precautions Precautions: Fall Precaution Comments: wound vac Required Braces or Orthoses: Knee Immobilizer - Left Knee Immobilizer - Left: On at all times Restrictions Weight Bearing Restrictions: Yes LLE Weight Bearing: Weight bearing as tolerated       Mobility Bed Mobility Overal bed mobility: Needs Assistance Bed Mobility: Rolling Rolling: Total assist     Sit to supine: Total assist;+2 for physical assistance      Transfers Overall transfer level: Needs assistance Equipment used:  (2 person A) Transfers: Squat Pivot Transfers     Squat pivot transfers: Total assist;+2 physical assistance     General transfer comment: bed pad and gait belt    Balance Overall balance assessment: Needs assistance Sitting-balance support: Feet supported;Bilateral upper extremity supported Sitting balance-Leahy Scale: Poor Sitting balance - Comments: Pt with posterior lean     Standing balance-Leahy Scale: Zero Standing balance comment: Unable to stand despite assist of 2.                            ADL either performed or assessed with clinical judgement   ADL Overall ADL's : Needs  assistance/impaired                         Toilet Transfer: Total assistance;+2 for physical assistance;Squat-pivot Toilet Transfer Details (indicate cue type and reason): recliner>bed using bed pad and gait belt                 Vision Patient Visual Report: No change from baseline            Cognition Arousal/Alertness: Awake/alert Behavior During Therapy: Anxious Overall Cognitive Status: No family/caregiver present to determine baseline cognitive functioning                                                     Pertinent Vitals/ Pain       Pain Assessment: Faces Faces Pain Scale: Hurts whole lot Pain Location: LLE with movement for up to edge of chair, chair>bed transfers, bed mobility Pain Descriptors / Indicators: Moaning;Grimacing;Guarding Pain Intervention(s): Monitored during session;Repositioned         Frequency  Min 2X/week        Progress Toward Goals  OT Goals(current goals can now be found in the care plan section)  Progress towards OT goals: Not progressing toward goals - comment (pt still with increased pain and needing increased A)            AM-PAC PT "  6 Clicks" Daily Activity     Outcome Measure   Help from another person eating meals?: A Lot Help from another person taking care of personal grooming?: A Lot Help from another person toileting, which includes using toliet, bedpan, or urinal?: Total Help from another person bathing (including washing, rinsing, drying)?: Total Help from another person to put on and taking off regular upper body clothing?: Total Help from another person to put on and taking off regular lower body clothing?: Total 6 Click Score: 8    End of Session Equipment Utilized During Treatment: Left knee immobilizer;Gait belt  OT Visit Diagnosis: Unsteadiness on feet (R26.81);History of falling (Z91.81);Repeated falls (R29.6);Pain Pain - Right/Left: Left Pain - part of body: Leg    Activity Tolerance Patient limited by pain   Patient Left in bed (with tech and nurse finishing up with her)   Nurse Communication  (A's NT with pt back to bed)        Time: [id number] OT Time Calculation (min): 22 min  Charges: OT General Charges $OT Visit: 1 Procedure OT Treatments $Self Care/Home Management : 23-37 mins  Ignacia PalmaCathy Seriah Brotzman, OTR/L [phone] 06/06/2016

## 2016-06-06 NOTE — Discharge Instructions (Signed)

## 2016-06-06 NOTE — Evaluation (Signed)
Occupational Therapy Evaluation Patient Details Name: Jessica Booth MRN: [id number] DOB: 07-04-1922 Today's Date: 06/06/2016    History of Present Illness Patient is a 83 y/o female who presents s/p internal fixation for periprosthetic femur fracture and revision of the hemiarthroplasty LLE after fall. PMH includes CAD s/p stening, HTN, CVA.   Clinical Impression   Pt admitted with a fall at her ALF.  Pt currently with functional limitations due to the deficits listed below (see OT Problem List).  Pt will benefit from skilled OT to increase their safety and independence with ADL and functional mobility for ADL to facilitate discharge to venue listed below.     Follow Up Recommendations  SNF    Equipment Recommendations  None recommended by OT       Precautions / Restrictions Precautions Precautions: Fall Precaution Comments: wound vac Required Braces or Orthoses: Knee Immobilizer - Left Knee Immobilizer - Left: On at all times Restrictions Weight Bearing Restrictions: Yes LLE Weight Bearing: Weight bearing as tolerated      Mobility Bed Mobility Overal bed mobility: Needs Assistance Bed Mobility: Supine to Sit     Supine to sit: Max assist;HOB elevated     General bed mobility comments: pt in chair  Transfers Overall transfer level: Needs assistance Equipment used: Rolling walker (2 wheeled);None Transfers: Sit to/from [employer] Sit to Stand: Total assist;+2 physical assistance   Squat pivot transfers: Total assist;+2 physical assistance     General transfer comment: pt sitting in chair and sliding out.  OT with the A of her grand daughter Aed pt scoot back in chair. Pt total A and grimaced in pain.     Balance Overall balance assessment: Needs assistance Sitting-balance support: Feet supported;Bilateral upper extremity supported Sitting balance-Leahy Scale: Fair Sitting balance - Comments: Able to sit unsupported EOB with BUEs.   Standing  balance support: During functional activity Standing balance-Leahy Scale: Zero Standing balance comment: Unable to stand despite assist of 2.                            ADL either performed or assessed with clinical judgement   ADL          Pt mod A for self feeding with max encouragement.  Pt total A for other ADL activity.                                     Vision Patient Visual Report: No change from baseline              Pertinent Vitals/Pain Pain Assessment: 0-10 Pain Score: 8  Faces Pain Scale: Hurts worst Pain Location: LLE with movement repositioning in the chair Pain Descriptors / Indicators: Grimacing Pain Intervention(s): Monitored during session;Limited activity within patient's tolerance;Relaxation;Repositioned     Hand Dominance Right   Extremity/Trunk Assessment Upper Extremity Assessment Upper Extremity Assessment: Overall WFL for tasks assessed   Lower Extremity Assessment Lower Extremity Assessment: LLE deficits/detail;Generalized weakness LLE Deficits / Details: Ankle AROm WFL, AROM and strength limited post op; reluctact to move at all. LLE: Unable to fully assess due to immobilization LLE Sensation:  (difficult to assess)   Cervical / Trunk Assessment Cervical / Trunk Assessment: Kyphotic   Communication Communication Communication: HOH   Cognition Arousal/Alertness: Awake/alert Behavior During Therapy: WFL for tasks assessed/performed (paranoid) Overall Cognitive Status: No family/caregiver present to determine baseline  cognitive functioning                                 General Comments: granddaughter present and pt not as anxious.     General Comments  Wound vac detached upon PT arrival. Reattached.             Home Living Family/patient expects to be discharged to:: Skilled nursing facility                             Home Equipment: Dan HumphreysWalker - 2 wheels;Cane - single  point;Bedside commode;Shower seat          Prior Functioning/Environment Level of Independence: Independent with assistive device(s)        Comments: Used RW vs SPC PTA. Difficulty getting PLOF due to distracted by pain/paranoia        OT Problem List: Decreased strength;Decreased range of motion;Pain;Decreased cognition;Decreased knowledge of use of DME or AE;Decreased safety awareness      OT Treatment/Interventions: Self-care/ADL training;Patient/family education;DME and/or AE instruction;Therapeutic activities    OT Goals(Current goals can be found in the care plan section) Acute Rehab OT Goals Patient Stated Goal: pts granddaugther present and stated plan is SNF rather than ALF OT Goal Formulation: With patient/family Time For Goal Achievement: 06/20/16  OT Frequency: Min 2X/week              AM-PAC PT "6 Clicks" Daily Activity     Outcome Measure Help from another person eating meals?: A Little Help from another person taking care of personal grooming?: A Lot Help from another person toileting, which includes using toliet, bedpan, or urinal?: Total Help from another person bathing (including washing, rinsing, drying)?: Total Help from another person to put on and taking off regular upper body clothing?: Total Help from another person to put on and taking off regular lower body clothing?: Total 6 Click Score: 9   End of Session Equipment Utilized During Treatment: Left knee immobilizer Nurse Communication: Mobility status  Activity Tolerance: Patient limited by pain Patient left: in chair;with call bell/phone within reach;with family/visitor present  OT Visit Diagnosis: Unsteadiness on feet (R26.81);History of falling (Z91.81);Repeated falls (R29.6);Pain Pain - Right/Left: Left Pain - part of body: Leg                Time: [id number] OT Time Calculation (min): 16 min Charges:  OT General Charges $OT Visit: 1 Procedure OT Evaluation $OT Eval Moderate  Complexity: 1 Procedure G-Codes:     Lise AuerLori Yuna Pizzolato, OT [phone]  Einar CrowEDDING, Tye Juarez D 06/06/2016, 11:52 AM

## 2016-06-06 NOTE — Evaluation (Signed)
Physical Therapy Evaluation Patient Details Name: Jessica Booth MRN: [id number] DOB: 04/03/1922 Today's Date: 06/06/2016   History of Present Illness  Patient is a 83 y/o female who presents s/p internal fixation for periprosthetic femur fracture and revision of the hemiarthroplasty LLE after fall. PMH includes CAD s/p stening, HTN, CVA.  Clinical Impression  Patient presents with pain, post op limitations LLE, anxiety, paranoia and confusion impacting mobility. Mobility assessment limited due to above but pt tolerated squat pivot transfer to chair with total A of 2. Pt reluctant to mobilize and pain is limiting factor. Pt from ALF PTA but not able to state which one. Difficult to know baseline cognition as no family members present. Will benefit from ST SNF to maximize independence and mobility prior to return home. Will follow acutely.    Follow Up Recommendations SNF;Supervision for mobility/OOB;Supervision/Assistance - 24 hour    Equipment Recommendations  None recommended by PT    Recommendations for Other Services       Precautions / Restrictions Precautions Precautions: Fall Precaution Comments: wound vac Required Braces or Orthoses: Knee Immobilizer - Left Knee Immobilizer - Left: On at all times Restrictions Weight Bearing Restrictions: Yes LLE Weight Bearing: Weight bearing as tolerated      Mobility  Bed Mobility Overal bed mobility: Needs Assistance Bed Mobility: Supine to Sit     Supine to sit: Max assist;HOB elevated     General bed mobility comments: Assist to bring BLEs to EOB and to elevate trunk with pt using rail for support with cues. Moaning, crying during transfer.  Transfers Overall transfer level: Needs assistance Equipment used: Rolling walker (2 wheeled);None Transfers: Sit to/from [employer];Squat Pivot Transfers Sit to Stand: Total assist;+2 physical assistance   Squat pivot transfers: Total assist;+2 physical assistance     General transfer  comment: Attempted to stand from EOB with total A of 2 but pt unable-resisting. Performed squat pivot transfer to chair with total A of 2 using chuck pad.  Ambulation/Gait                Stairs            Wheelchair Mobility    Modified Rankin (Stroke Patients Only)       Balance Overall balance assessment: Needs assistance Sitting-balance support: Feet supported;Bilateral upper extremity supported Sitting balance-Leahy Scale: Fair Sitting balance - Comments: Able to sit unsupported EOB with BUEs.   Standing balance support: During functional activity Standing balance-Leahy Scale: Zero Standing balance comment: Unable to stand despite assist of 2.                              Pertinent Vitals/Pain Pain Assessment: Faces Faces Pain Scale: Hurts worst Pain Location: LLE with movement Pain Descriptors / Indicators: Sore;Operative site guarding;Guarding;Grimacing;Moaning Pain Intervention(s): Monitored during session;Repositioned;Limited activity within patient's tolerance;Relaxation    Home Living Family/patient expects to be discharged to:: Skilled nursing facility               Home Equipment: Dan HumphreysWalker - 2 wheels;Cane - single point;Bedside commode;Shower seat      Prior Function Level of Independence: Independent with assistive device(s)         Comments: Used RW vs SPC PTA. Difficulty getting PLOF due to distracted by pain/paranoia     Hand Dominance   Dominant Hand: Right    Extremity/Trunk Assessment   Upper Extremity Assessment Upper Extremity Assessment: Defer to OT evaluation (Residual left hand  weakness from prior CVA.)    Lower Extremity Assessment Lower Extremity Assessment: LLE deficits/detail;Generalized weakness LLE Deficits / Details: Ankle AROm WFL, AROM and strength limited post op; reluctact to move at all. LLE: Unable to fully assess due to immobilization LLE Sensation:  (difficult to assess)    Cervical /  Trunk Assessment Cervical / Trunk Assessment: Kyphotic  Communication   Communication: HOH  Cognition Arousal/Alertness: Awake/alert Behavior During Therapy: Anxious (paranoid) Overall Cognitive Status: No family/caregiver present to determine baseline cognitive functioning                                 General Comments: "they cut my leg off!" "what is happening to me?" after beingtold several times that her leg is intact and the situation. A&Ox2.      General Comments General comments (skin integrity, edema, etc.): Wound vac detached upon PT arrival. Reattached.     Exercises     Assessment/Plan    PT Assessment Patient needs continued PT services  PT Problem List Decreased strength;Decreased mobility;Decreased range of motion;Decreased skin integrity;Pain;Decreased balance;Decreased activity tolerance;Decreased cognition       PT Treatment Interventions Therapeutic activities;Gait training;Therapeutic exercise;Patient/family education;Balance training;Functional mobility training;DME instruction;Cognitive remediation    PT Goals (Current goals can be found in the Care Plan section)  Acute Rehab PT Goals Patient Stated Goal: none stated PT Goal Formulation: Patient unable to participate in goal setting Time For Goal Achievement: 06/20/16 Potential to Achieve Goals: Fair    Frequency Min 3X/week   Barriers to discharge Decreased caregiver support from ALF    Co-evaluation               AM-PAC PT "6 Clicks" Daily Activity  Outcome Measure Difficulty turning over in bed (including adjusting bedclothes, sheets and blankets)?: Total Difficulty moving from lying on back to sitting on the side of the bed? : Total Difficulty sitting down on and standing up from a chair with arms (e.g., wheelchair, bedside commode, etc,.)?: Total Help needed moving to and from a bed to chair (including a wheelchair)?: Total Help needed walking in hospital room?:  Total Help needed climbing 3-5 steps with a railing? : Total 6 Click Score: 6    End of Session Equipment Utilized During Treatment: Gait belt;Left knee immobilizer Activity Tolerance: Patient limited by pain Patient left: in chair;with call bell/phone within reach Nurse Communication: Need for lift equipment;Mobility status PT Visit Diagnosis: Pain;Muscle weakness (generalized) (M62.81);Unsteadiness on feet (R26.81) Pain - Right/Left: Left Pain - part of body: Leg    Time: 2130-8657 PT Time Calculation (min) (ACUTE ONLY): 35 min   Charges:   PT Evaluation $PT Eval Moderate Complexity: 1 Procedure PT Treatments $Therapeutic Activity: 8-22 mins   PT G Codes:        Mylo Red, PT, DPT [phone]    Blake Divine A Omunique Pederson 06/06/2016, 10:38 AM

## 2016-06-06 NOTE — NC FL2 (Signed)
[place] MEDICAID FL2 LEVEL OF CARE SCREENING TOOL     IDENTIFICATION  Patient Name: Jessica GaskinsDorothy R. Booth Birthdate: 1922/08/01 Sex: female Admission Date (Current Location): 06/03/2016  [place]   Facility and Address:  [facility], [address], [place], [place] [address]      Provider Number: [id number]  Attending Physician Name and Address:  Zannie CoveJoseph, Preetha, MD  Relative Name and Phone Number:  Jessica Booth    Current Level of Care: Hospital Recommended Level of Care: Skilled Nursing Facility Prior Approval Number:    Date Approved/Denied: 06/06/16 PASRR Number: [id number]  Discharge Plan: SNF    Current Diagnoses: Patient Active Problem List   Diagnosis Date Noted  . Periprosthetic fracture around internal prosthetic left hip joint (HCC) 06/03/2016  . Left arm weakness   . S/P hip hemiarthroplasty   . Stroke (cerebrum) (HCC) 03/28/2016  . Essential hypertension 03/27/2016  . CAD (coronary artery disease) 03/27/2016    Orientation RESPIRATION BLADDER Height & Weight     Self  Normal Continent Weight: 103 lb (46.7 kg) Height:  5\' 3"  (160 cm)  BEHAVIORAL SYMPTOMS/MOOD NEUROLOGICAL BOWEL NUTRITION STATUS      Continent Diet (See DC summary)  AMBULATORY STATUS COMMUNICATION OF NEEDS Skin   Extensive Assist Verbally Wound Vac, Surgical wounds (Closed Left Hip, Prevena Wound vac)                       Personal Care Assistance Level of Assistance  Bathing, Feeding, Dressing Bathing Assistance: Maximum assistance Feeding assistance: Limited assistance Dressing Assistance: Maximum assistance     Functional Limitations Info  Sight, Hearing, Speech Sight Info: Adequate Hearing Info: Adequate Speech Info: Adequate    SPECIAL CARE FACTORS FREQUENCY  PT (By licensed PT), OT (By licensed OT)     PT Frequency: 5xweek OT Frequency: 5xweek            Contractures      Additional Factors Info  Code  Status, Allergies, Psychotropic Code Status Info: DNR Allergies Info: PENICILLINS Psychotropic Info: Celexa         Current Medications (06/06/2016):  This is the current hospital active medication list Current Facility-Administered Medications  Medication Dose Route Frequency Provider Last Rate Last Dose  . acetaminophen (TYLENOL) tablet 650 mg  650 mg Oral Q6H PRN Nadara Mustarduda, Marcus V, MD       Or  . acetaminophen (TYLENOL) suppository 650 mg  650 mg Rectal Q6H PRN Nadara Mustarduda, Marcus V, MD      . citalopram (CELEXA) tablet 20 mg  20 mg Oral Daily Jonah BlueYates, Jennifer, MD   20 mg at 06/06/16 1036  . clopidogrel (PLAVIX) tablet 75 mg  75 mg Oral Daily Zannie CoveJoseph, Preetha, MD   75 mg at 06/06/16 1036  . enoxaparin (LOVENOX) injection 30 mg  30 mg Subcutaneous Q24H Zannie CoveJoseph, Preetha, MD   30 mg at 06/06/16 1030  . feeding supplement (ENSURE ENLIVE) (ENSURE ENLIVE) liquid 237 mL  237 mL Oral BID BM Jonah BlueYates, Jennifer, MD   237 mL at 06/04/16 1433  . HYDROcodone-acetaminophen (NORCO/VICODIN) 5-325 MG per tablet 1-2 tablet  1-2 tablet Oral Q6H PRN Nadara Mustarduda, Marcus V, MD      . lactated ringers infusion   Intravenous Continuous Zannie CoveJoseph, Preetha, MD 50 mL/hr at 06/06/16 1036    . meclizine (ANTIVERT) tablet 12.5 mg  12.5 mg Oral BID PRN Jonah BlueYates, Jennifer, MD   12.5 mg at 06/04/16 1051  .  menthol-cetylpyridinium (CEPACOL) lozenge 3 mg  1 lozenge Oral PRN Nadara Mustard, MD       Or  . phenol (CHLORASEPTIC) mouth spray 1 spray  1 spray Mouth/Throat PRN Nadara Mustard, MD      . metoprolol tartrate (LOPRESSOR) tablet 25 mg  25 mg Oral BID Jonah Blue, MD   25 mg at 06/06/16 1037  . morphine 4 MG/ML injection 0.52 mg  0.52 mg Intravenous Q2H PRN Nadara Mustard, MD   0.52 mg at 06/06/16 0054  . ondansetron (ZOFRAN) tablet 4 mg  4 mg Oral Q6H PRN Nadara Mustard, MD       Or  . ondansetron [facility]) injection 4 mg  4 mg Intravenous Q6H PRN Nadara Mustard, MD      . senna Mancel Parsons) tablet 8.6 mg  1 tablet Oral Daily PRN Jonah Blue, MD         Discharge Medications: Please see discharge summary for a list of discharge medications.  Relevant Imaging Results:  Relevant Lab Results:   Additional Information ZO:[id number]  Tresa Moore, LCSW

## 2016-06-07 ENCOUNTER — Encounter (HOSPITAL_COMMUNITY): Payer: Self-pay | Admitting: Orthopedic Surgery

## 2016-06-07 LAB — BASIC METABOLIC PANEL
Anion gap: 6 (ref 5–15)
BUN: 28 mg/dL — ABNORMAL HIGH (ref 6–20)
CALCIUM: 8.5 mg/dL — AB (ref 8.9–10.3)
CHLORIDE: 104 mmol/L (ref 101–111)
CO2: 25 mmol/L (ref 22–32)
Creatinine, Ser: 1.02 mg/dL — ABNORMAL HIGH (ref 0.44–1.00)
GFR calc Af Amer: 54 mL/min — ABNORMAL LOW (ref >60)
GFR calc non Af Amer: 46 mL/min — ABNORMAL LOW (ref >60)
GLUCOSE: 105 mg/dL — AB (ref 65–99)
POTASSIUM: 3.9 mmol/L (ref 3.5–5.1)
Sodium: 135 mmol/L (ref 135–145)

## 2016-06-07 LAB — CBC
HEMATOCRIT: 25.6 % — AB (ref 36.0–46.0)
HEMOGLOBIN: 8.5 g/dL — AB (ref 12.0–15.0)
MCH: 31.6 pg (ref 26.0–34.0)
MCHC: 33.2 g/dL (ref 30.0–36.0)
MCV: 95.2 fL (ref 78.0–100.0)
Platelets: 175 10*3/uL (ref 150–400)
RBC: 2.69 MIL/uL — ABNORMAL LOW (ref 3.87–5.11)
RDW: 12.6 % (ref 11.5–15.5)
WBC: 10.9 10*3/uL — ABNORMAL HIGH (ref 4.0–10.5)

## 2016-06-07 MED ORDER — POTASSIUM CHLORIDE CRYS ER 20 MEQ PO TBCR
40.0000 meq | EXTENDED_RELEASE_TABLET | Freq: Once | ORAL | Status: AC
  Administered 2016-06-07: 40 meq via ORAL
  Filled 2016-06-07: qty 2

## 2016-06-07 MED ORDER — FUROSEMIDE 20 MG PO TABS
20.0000 mg | ORAL_TABLET | Freq: Once | ORAL | Status: AC
  Administered 2016-06-07: 20 mg via ORAL
  Filled 2016-06-07: qty 1

## 2016-06-07 NOTE — Progress Notes (Signed)
Physical Therapy Treatment Patient Details Name: Jessica GaskinsDorothy R. Booth MRN: [id number] DOB: [birth year] Today's Date: 06/07/2016    History of Present Illness Patient is a 83 y/o female who presents s/p internal fixation for periprosthetic femur fracture and revision of the hemiarthroplasty LLE after fall. PMH includes CAD s/p stening, HTN, CVA.    PT Comments    Pt remains weak and debiliated but able to progress to standing trials with the RW.  Pt presents with urinary and bowel incontinence during session.  Pt c/o pains but pains subsides when movement does.  Plan for short term SNF remains appropriate.    Follow Up Recommendations  SNF;Supervision for mobility/OOB;Supervision/Assistance - 24 hour     Equipment Recommendations  None recommended by PT    Recommendations for Other Services       Precautions / Restrictions Precautions Precautions: Fall Precaution Comments: wound vac Required Braces or Orthoses: Knee Immobilizer - Left Knee Immobilizer - Left: On at all times Restrictions Weight Bearing Restrictions: Yes LLE Weight Bearing: Weight bearing as tolerated    Mobility  Bed Mobility Overal bed mobility: Needs Assistance Bed Mobility: Rolling Rolling: Min assist;Total assist (Min assist to the R total to roll onto L hip )   Supine to sit: Max assist Sit to supine: Total assist   General bed mobility comments: Pt required cues for hand placement in regards to rolling to the R and the L.  Pt with improved ease during rolling to the R.  Pt required +2 total assist for boosting in bed and max to total assist for supine<>sit.  Pt with posterior lean.    Transfers Overall transfer level: Needs assistance Equipment used: Rolling walker (2 wheeled) Transfers: Squat Pivot Transfers Sit to Stand: Max assist         General transfer comment: Pt required cues for hand placement, B knee extension and B hip extension into standing.  Pt able to perform sit to stand x2 reps.  Pt  with urinary and stool incontinence during each additonal sit to stand.    Ambulation/Gait Ambulation/Gait assistance:  (Pt unable and unsafe to progress.  In standing patient with B knees buckling.  )               Stairs            Wheelchair Mobility    Modified Rankin (Stroke Patients Only)       Balance Overall balance assessment: Needs assistance Sitting-balance support: Feet supported;Bilateral upper extremity supported Sitting balance-Leahy Scale: Poor       Standing balance-Leahy Scale: Zero                              Cognition Arousal/Alertness: Awake/alert Behavior During Therapy: Anxious Overall Cognitive Status: No family/caregiver present to determine baseline cognitive functioning                                        Exercises      General Comments        Pertinent Vitals/Pain Pain Assessment: Faces Pain Score: 8  Pain Location: LLE with movement for up to edge of chair, chair>bed transfers, bed mobility Pain Descriptors / Indicators: Moaning;Grimacing;Guarding Pain Intervention(s): Monitored during session;Repositioned    Home Living  Prior Function            PT Goals (current goals can now be found in the care plan section) Acute Rehab PT Goals Patient Stated Goal: pts granddaugther present and stated plan is SNF rather than ALF Potential to Achieve Goals: Fair Progress towards PT goals: Progressing toward goals    Frequency    Min 3X/week      PT Plan Current plan remains appropriate    Co-evaluation              AM-PAC PT "6 Clicks" Daily Activity  Outcome Measure  Difficulty turning over in bed (including adjusting bedclothes, sheets and blankets)?: Total Difficulty moving from lying on back to sitting on the side of the bed? : Total Difficulty sitting down on and standing up from a chair with arms (e.g., wheelchair, bedside commode, etc,.)?:  Total Help needed moving to and from a bed to chair (including a wheelchair)?: A Lot Help needed walking in hospital room?: Total Help needed climbing 3-5 steps with a railing? : Total 6 Click Score: 7    End of Session Equipment Utilized During Treatment: Gait belt Activity Tolerance: Patient limited by pain Patient left: in chair;with call bell/phone within reach Nurse Communication: Need for lift equipment;Mobility status PT Visit Diagnosis: Pain;Muscle weakness (generalized) (M62.81);Unsteadiness on feet (R26.81) Pain - Right/Left: Left Pain - part of body: Leg     Time: 1610-9604 PT Time Calculation (min) (ACUTE ONLY): 36 min  Charges:  $Therapeutic Activity: 23-37 mins                    G Codes:       [name], PTA pager [phone]    [name] 06/07/2016, 5:21 PM

## 2016-06-07 NOTE — Clinical Social Work Note (Signed)
Clinical Social Work Assessment  Patient Details  Name: Jessica GaskinsDorothy R. Parfitt MRN: [id number] Date of Birth: 10-30-1922  Date of referral:  06/06/16               Reason for consult:  Facility Placement                Permission sought to share information with:  Facility Industrial/product designerContact Representative Permission granted to share information::  Yes, Verbal Permission Granted  Name::     Financial controllerAngela  Agency::  SNF  Relationship::  grand daughter  Contact Information:     Housing/Transportation Living arrangements for the past 2 months:  Assisted Living Facility Source of Information:   (Granddaughter Health visitorAngela) Patient Interpreter Needed:  None Criminal Activity/Legal Involvement Pertinent to Current Situation/Hospitalization:  No - Comment as needed Significant Relationships:  Other Family Members Lives with:  Facility Resident Do you feel safe going back to the place where you live?  No Need for family participation in patient care:  Yes (Comment)  Care giving concerns:  Patient fell at ALF and unsafe to return at this time due to rehabilitation needs. CSW spoke with granddaughter Marylene Landngela who is in agreement with clinical recommendations and would like [facility].  Family has already communicated with facility.  Social Worker assessment / plan:  CSW spoke with [facility] and they are abel to take patient and has accepted. FL2 and passr obtained. [facility].  Employment status:  Retired Database administratornsurance information:  Managed Medicare PT Recommendations:  Skilled Nursing Facility Information / Referral to community resources:  Skilled Nursing Facility  Patient/Family's Response to care:  Family appreciative of care and assistance by CSW. No issues or concerns identified at this time.  Patient/Family's Understanding of and Emotional Response to Diagnosis, Current Treatment, and Prognosis:  Family has good understanding of diagnosis, current treatment,  and prognosis. Family hopeful that patient will improve with rehabilitation. No issues or concerns at this time.  Emotional Assessment Appearance:  Appears stated age Attitude/Demeanor/Rapport:   (Cooperative) Affect (typically observed):  Accepting, Appropriate Orientation:  Oriented to Self Alcohol / Substance use:  Not Applicable Psych involvement (Current and /or in the community):  No (Comment)  Discharge Needs  Concerns to be addressed:  Care Coordination Readmission within the last 30 days:  No Current discharge risk:  Dependent with Mobility, Physical Impairment Barriers to Discharge:  No Barriers Identified   Tresa Mooreatricia V Leeanna Slaby, LCSW 06/07/2016, 12:08 PM

## 2016-06-07 NOTE — Social Work (Signed)
CSW contacted [facility] to advised that patient is not medically stable today to DC to facility. Doctor indicated that patient can DC tomorrow.  CSW called [facility] to confirm that they have an admissions person on the weekend and she agreed. Weekend admission staff is April HoldingMary Miles [phone].   CSW advised granddaughter Marylene Landngela that patient will DC tomorrow to facility as patient not medically stable today. She was not pleased and indicated that she is out of town and patient will not get PT on weekend and that hospital should keep patient through weekend. CSW validated and indicated that if patient is medically stable to be DC tomorrow, hospital cannot keep patient and therefore must DC at that time.

## 2016-06-07 NOTE — Progress Notes (Signed)
PROGRESS NOTE    Jessica Booth  WUJ:[id number] DOB: 1922/11/29 DOA: 06/03/2016 PCP: Vivien Presto, MD    Brief Narrative: Jessica Booth is a 83 y.o. female with medical history significant of coronary artery disease status post stenting, hypertension, and left hip fracture in 3/17 requiring left hip hemiarthroplasty. She fell today and landed on the left hip.  She was found to have left periprosthetic hip fracture. She was admitted for surgical repair.   Assessment & Plan:   Left periprosthetic hip fracture -Orthopedics consulted -s/p ORIF 5/30 -lovenox for DVT prophylaxis -drop in Hb, SNF for Rehab if Hb stable  S/p CVA with residual left arm weakness -This appears to have occurred during prior hospitalization in 3/18 -restarted plavix  Acute Blood loss anemia -due to post op blood loss and hemodilution -CBC in am, no active bleeding noted  HTN -Continue Lopressor  CAD -Continue Lipitor, metoprolol, plavix  ? Dementia vs Cognitive impairment -suspect this based on my patient interactions over last 2days  DVT prophylaxis:  lovenox Code Status: DNR Family Communication: None at bedside Disposition Plan:  SNF tomorrow if stable   Consultants:   Dr Lajoyce Corners.  Procedures: 5/30: Revision monopolar hemiarthroplasty due to dislocated hip Application Prevena wound VAC.  Antimicrobials: none   Subjective: Pain controlled, unhappy about everything in the hospital  Objective: Vitals:   06/06/16 2100 06/07/16 0604 06/07/16 0848 06/07/16 1400  BP: (!) 91/50 (!) 126/51 (!) 128/56 (!) 116/49  Pulse: 85 76 74 65  Resp:    12  Temp: 98.2 F (36.8 C) 97.8 F (36.6 C) 98.6 F (37 C) 98.5 F (36.9 C)  TempSrc: Oral Oral Oral Oral  SpO2: 95% 96% 98% 100%  Weight:      Height:        Intake/Output Summary (Last 24 hours) at 06/07/16 1453 Last data filed at 06/07/16 0900  Gross per 24 hour  Intake              300 ml  Output              551 ml  Net              -251 ml   Filed Weights   06/03/16 1626 06/04/16 0006  Weight: 52.2 kg (115 lb) 46.7 kg (103 lb)    Examination:  General exam: Appears calm and comfortable, oriented to self, place Respiratory system: Clear to auscultation. Respiratory effort normal. Cardiovascular system: S1 & S2 heard, RRR. No JVD, murmurs, rubs Gastrointestinal system: Abdomen is nondistended, soft and nontender. Normal bowel sounds heard. Central nervous system: Alert and confused. . No focal neurological deficits. Extremities: left hip tenderness, leg shortened and ext rotated, dressing and wound vac Skin: No rashes, lesions or ulcers     Data Reviewed: I have personally reviewed following labs and imaging studies  CBC:  Recent Labs Lab 06/03/16 1737 06/06/16 0701 06/07/16 0724  WBC 10.7* 13.3* 10.9*  NEUTROABS 7.2  --   --   HGB 13.0 9.9* 8.5*  HCT 38.0 29.1* 25.6*  MCV 94.5 95.7 95.2  PLT 228 201 175   Basic Metabolic Panel:  Recent Labs Lab 06/03/16 1737 06/06/16 0701 06/07/16 0724  NA 138 136 135  K 4.4 4.5 3.9  CL 106 105 104  CO2 21* 20* 25  GLUCOSE 98 128* 105*  BUN 22* 28* 28*  CREATININE 1.03* 1.13* 1.02*  CALCIUM 9.6 8.6* 8.5*   GFR: Estimated Creatinine Clearance: 25.9 mL/min (A) (  by C-G formula based on SCr of 1.02 mg/dL (H)). Liver Function Tests:  Recent Labs Lab 06/03/16 1737  AST 35  ALT 25  ALKPHOS 64  BILITOT 0.5  PROT 6.9  ALBUMIN 4.3   No results for input(s): LIPASE, AMYLASE in the last 168 hours. No results for input(s): AMMONIA in the last 168 hours. Coagulation Profile: No results for input(s): INR, PROTIME in the last 168 hours. Cardiac Enzymes: No results for input(s): CKTOTAL, CKMB, CKMBINDEX, TROPONINI in the last 168 hours. BNP (last 3 results) No results for input(s): PROBNP in the last 8760 hours. HbA1C: No results for input(s): HGBA1C in the last 72 hours. CBG: No results for input(s): GLUCAP in the last 168 hours. Lipid  Profile: No results for input(s): CHOL, HDL, LDLCALC, TRIG, CHOLHDL, LDLDIRECT in the last 72 hours. Thyroid Function Tests: No results for input(s): TSH, T4TOTAL, FREET4, T3FREE, THYROIDAB in the last 72 hours. Anemia Panel: No results for input(s): VITAMINB12, FOLATE, FERRITIN, TIBC, IRON, RETICCTPCT in the last 72 hours. Sepsis Labs: No results for input(s): PROCALCITON, LATICACIDVEN in the last 168 hours.  Recent Results (from the past 240 hour(s))  Surgical PCR screen     Status: None   Collection Time: 06/04/16  3:22 AM  Result Value Ref Range Status   MRSA, PCR NEGATIVE NEGATIVE Final   Staphylococcus aureus NEGATIVE NEGATIVE Final    Comment:        The Xpert SA Assay (FDA approved for NASAL specimens in patients over 81 years of age), is one component of a comprehensive surveillance program.  Test performance has been validated by [facility] for patients greater than or equal to 81 year old. It is not intended to diagnose infection nor to guide or monitor treatment.          Radiology Studies: Pelvis Portable  Result Date: 06/05/2016 CLINICAL DATA:  Status post left hip replacement. EXAM: PORTABLE PELVIS 1-2 VIEWS COMPARISON:  Left hip CT dated 06/03/2016. Pelvis radiograph dated 03/27/2016. FINDINGS: Stable left hip placement with interval cerclage wires at the inferior intertrochanteric and subtrochanteric regions. The recently demonstrated fracture at that level is not visible. Stable hardware fixation of the right femur and hip. Diffuse osteopenia. No new fracture or dislocation. IMPRESSION: Interval cerclage wire placement at the level of the recently demonstrated proximal left femur fracture. Electronically Signed   By: Beckie SaltsSteven  Reid M.D.   On: 06/05/2016 16:09        Scheduled Meds: . citalopram  20 mg Oral Daily  . clopidogrel  75 mg Oral Daily  . enoxaparin (LOVENOX) injection  30 mg Subcutaneous Q24H  . feeding supplement (ENSURE ENLIVE)  237 mL Oral  BID BM  . metoprolol tartrate  25 mg Oral BID   Continuous Infusions:    LOS: 4 days    Time spent: 25 minutes.     Zannie CoveJOSEPH,Jaeliana Lococo, MD [facility] Pager [phone]  If 7PM-7AM, please contact night-coverage [email or web address] Password [email or web address] 06/07/2016, 2:53 PM

## 2016-06-08 DIAGNOSIS — M80059D Age-related osteoporosis with current pathological fracture, unspecified femur, subsequent encounter for fracture with routine healing: Secondary | ICD-10-CM

## 2016-06-08 LAB — CBC
HEMATOCRIT: 24.4 % — AB (ref 36.0–46.0)
HEMOGLOBIN: 8.2 g/dL — AB (ref 12.0–15.0)
MCH: 32.4 pg (ref 26.0–34.0)
MCHC: 33.6 g/dL (ref 30.0–36.0)
MCV: 96.4 fL (ref 78.0–100.0)
PLATELETS: 209 10*3/uL (ref 150–400)
RBC: 2.53 MIL/uL — AB (ref 3.87–5.11)
RDW: 12.6 % (ref 11.5–15.5)
WBC: 11.7 10*3/uL — AB (ref 4.0–10.5)

## 2016-06-08 LAB — BASIC METABOLIC PANEL
ANION GAP: 8 (ref 5–15)
BUN: 29 mg/dL — ABNORMAL HIGH (ref 6–20)
CHLORIDE: 101 mmol/L (ref 101–111)
CO2: 24 mmol/L (ref 22–32)
Calcium: 8.2 mg/dL — ABNORMAL LOW (ref 8.9–10.3)
Creatinine, Ser: 1.08 mg/dL — ABNORMAL HIGH (ref 0.44–1.00)
GFR calc non Af Amer: 43 mL/min — ABNORMAL LOW (ref >60)
GFR, EST AFRICAN AMERICAN: 50 mL/min — AB (ref >60)
Glucose, Bld: 107 mg/dL — ABNORMAL HIGH (ref 65–99)
POTASSIUM: 3.9 mmol/L (ref 3.5–5.1)
SODIUM: 133 mmol/L — AB (ref 135–145)

## 2016-06-08 MED ORDER — FUROSEMIDE 20 MG PO TABS
20.0000 mg | ORAL_TABLET | Freq: Once | ORAL | Status: AC
  Administered 2016-06-08: 20 mg via ORAL
  Filled 2016-06-08: qty 1

## 2016-06-08 NOTE — Progress Notes (Signed)
RN paged to notify Hospitalists of patients blood pressure (109/53). MD returned call and advised to continue to monitor patients status until further notice. Nursing will continue to monitor.

## 2016-06-08 NOTE — Progress Notes (Signed)
PROGRESS NOTE    Jessica GaskinsDorothy R. Booth  ZOX:[id number] DOB: 1922-03-07 DOA: 06/03/2016 PCP: Vivien Prestoorrington, Kip A, MD    Brief Narrative: Jessica Gaskinsorothy R. Booth is a 83 y.o. female with medical history significant of coronary artery disease status post stenting, hypertension, and left hip fracture in 3/17 requiring left hip hemiarthroplasty. She fell today and landed on the left hip.  She was found to have left periprosthetic hip fracture. She was admitted for surgical repair.   Assessment & Plan:   Left periprosthetic hip fracture -Orthopedics consulted -s/p ORIF 5/30 -lovenox for DVT prophylaxis -SNF tomorrow  Acute Blood loss anemia -due to post op blood loss and hemodilution -Baseline Hb is 10.5-11, dropped to 8.5 range post op, hemodilution also contributing -Hb of 13 on admission was due to dehydration, hemoconcentration -CBC in am, no active bleeding noted  S/p CVA with residual left arm weakness -This appears to have occurred during prior hospitalization in 3/18 -restarted plavix  HTN -Continue Lopressor  CAD -Continue Lipitor, metoprolol, plavix  ? Dementia vs Cognitive impairment -suspect this based on my patient interactions over last few days  DVT prophylaxis:  lovenox Code Status: DNR Family Communication: None at bedside Disposition Plan:  SNF tomorrow if stable   Consultants:   Dr Lajoyce Cornersuda.  Procedures: 5/30: Revision monopolar hemiarthroplasty due to dislocated hip Application Prevena wound VAC.  Antimicrobials: none   Subjective: Feels ok and complains about everything  Objective: Vitals:   06/07/16 0848 06/07/16 1400 06/07/16 1950 06/08/16 0451  BP: (!) 128/56 (!) 116/49 (!) 137/56 (!) 136/57  Pulse: 74 65 72 65  Resp:  12    Temp: 98.6 F (37 C) 98.5 F (36.9 C) 98 F (36.7 C) 98 F (36.7 C)  TempSrc: Oral Oral Oral Oral  SpO2: 98% 100% 100% 97%  Weight:      Height:        Intake/Output Summary (Last 24 hours) at 06/08/16 1321 Last data  filed at 06/08/16 0827  Gross per 24 hour  Intake              800 ml  Output              525 ml  Net              275 ml   Filed Weights   06/03/16 1626 06/04/16 0006  Weight: 52.2 kg (115 lb) 46.7 kg (103 lb)    Examination:  General exam: Appears calm and comfortable, oriented to self, place Respiratory system: Clear to auscultation. Respiratory effort normal. Cardiovascular system: S1 & S2 heard, RRR. No JVD, murmurs, rubs Gastrointestinal system: Abdomen is nondistended, soft and nontender. Normal bowel sounds heard. Central nervous system: Alert and confused. . No focal neurological deficits. Extremities: left hip tenderness, leg shortened and ext rotated, dressing and wound vac Skin: No rashes, lesions or ulcers     Data Reviewed: I have personally reviewed following labs and imaging studies  CBC:  Recent Labs Lab 06/03/16 1737 06/06/16 0701 06/07/16 0724 06/08/16 0037  WBC 10.7* 13.3* 10.9* 11.7*  NEUTROABS 7.2  --   --   --   HGB 13.0 9.9* 8.5* 8.2*  HCT 38.0 29.1* 25.6* 24.4*  MCV 94.5 95.7 95.2 96.4  PLT 228 201 175 209   Basic Metabolic Panel:  Recent Labs Lab 06/03/16 1737 06/06/16 0701 06/07/16 0724 06/08/16 0037  NA 138 136 135 133*  K 4.4 4.5 3.9 3.9  CL 106 105 104 101  CO2 21*  20* 25 24  GLUCOSE 98 128* 105* 107*  BUN 22* 28* 28* 29*  CREATININE 1.03* 1.13* 1.02* 1.08*  CALCIUM 9.6 8.6* 8.5* 8.2*   GFR: Estimated Creatinine Clearance: 24.5 mL/min (A) (by C-G formula based on SCr of 1.08 mg/dL (H)). Liver Function Tests:  Recent Labs Lab 06/03/16 1737  AST 35  ALT 25  ALKPHOS 64  BILITOT 0.5  PROT 6.9  ALBUMIN 4.3   No results for input(s): LIPASE, AMYLASE in the last 168 hours. No results for input(s): AMMONIA in the last 168 hours. Coagulation Profile: No results for input(s): INR, PROTIME in the last 168 hours. Cardiac Enzymes: No results for input(s): CKTOTAL, CKMB, CKMBINDEX, TROPONINI in the last 168 hours. BNP  (last 3 results) No results for input(s): PROBNP in the last 8760 hours. HbA1C: No results for input(s): HGBA1C in the last 72 hours. CBG: No results for input(s): GLUCAP in the last 168 hours. Lipid Profile: No results for input(s): CHOL, HDL, LDLCALC, TRIG, CHOLHDL, LDLDIRECT in the last 72 hours. Thyroid Function Tests: No results for input(s): TSH, T4TOTAL, FREET4, T3FREE, THYROIDAB in the last 72 hours. Anemia Panel: No results for input(s): VITAMINB12, FOLATE, FERRITIN, TIBC, IRON, RETICCTPCT in the last 72 hours. Sepsis Labs: No results for input(s): PROCALCITON, LATICACIDVEN in the last 168 hours.  Recent Results (from the past 240 hour(s))  Surgical PCR screen     Status: None   Collection Time: 06/04/16  3:22 AM  Result Value Ref Range Status   MRSA, PCR NEGATIVE NEGATIVE Final   Staphylococcus aureus NEGATIVE NEGATIVE Final    Comment:        The Xpert SA Assay (FDA approved for NASAL specimens in patients over 74 years of age), is one component of a comprehensive surveillance program.  Test performance has been validated by [facility] for patients greater than or equal to 63 year old. It is not intended to diagnose infection nor to guide or monitor treatment.          Radiology Studies: No results found.      Scheduled Meds: . citalopram  20 mg Oral Daily  . clopidogrel  75 mg Oral Daily  . enoxaparin (LOVENOX) injection  30 mg Subcutaneous Q24H  . feeding supplement (ENSURE ENLIVE)  237 mL Oral BID BM  . metoprolol tartrate  25 mg Oral BID   Continuous Infusions:    LOS: 5 days    Time spent: 25 minutes.     Zannie Cove, MD [facility] Pager [phone]  If 7PM-7AM, please contact night-coverage [email or web address] Password [email or web address] 06/08/2016, 1:21 PM

## 2016-06-08 NOTE — Progress Notes (Signed)
Subjective: Patient stable.  She's upset about having to undergo repeat rehabilitation on the left leg   Objective: Vital signs in last 24 hours: Temp:  [98 F (36.7 C)-98.5 F (36.9 C)] 98 F (36.7 C) (06/02 0451) Pulse Rate:  [65-72] 65 (06/02 0451) Resp:  [12] 12 (06/01 1400) BP: (116-137)/(49-57) 136/57 (06/02 0451) SpO2:  [97 %-100 %] 97 % (06/02 0451)  Intake/Output from previous day: 06/01 0701 - 06/02 0700 In: 710 [P.O.:710] Out: 525 [Urine:525] Intake/Output this shift: No intake/output data recorded.  Exam:  Dorsiflexion/Plantar flexion intact  Labs:  Recent Labs  06/06/16 0701 06/07/16 0724 06/08/16 0037  HGB 9.9* 8.5* 8.2*    Recent Labs  06/07/16 0724 06/08/16 0037  WBC 10.9* 11.7*  RBC 2.69* 2.53*  HCT 25.6* 24.4*  PLT 175 209    Recent Labs  06/07/16 0724 06/08/16 0037  NA 135 133*  K 3.9 3.9  CL 104 101  CO2 25 24  BUN 28* 29*  CREATININE 1.02* 1.08*  GLUCOSE 105* 107*  CALCIUM 8.5* 8.2*   No results for input(s): LABPT, INR in the last 72 hours.  Assessment/Plan: Impression is left periprosthetic fracture treated with cable and new prosthesis.  Leg lengths equal.  Ankle dorsiflexion intact on the left.  Plan is to mobilize with physical therapy.  She'll need skilled nursing next week.  Follow hemoglobin.  Currently it is 8.2.   Burnard BuntingG Scott Calyn Rubi 06/08/2016, 9:33 AM

## 2016-06-09 LAB — CBC
HCT: 25.8 % — ABNORMAL LOW (ref 36.0–46.0)
Hemoglobin: 8.6 g/dL — ABNORMAL LOW (ref 12.0–15.0)
MCH: 31.6 pg (ref 26.0–34.0)
MCHC: 33.3 g/dL (ref 30.0–36.0)
MCV: 94.9 fL (ref 78.0–100.0)
PLATELETS: 290 10*3/uL (ref 150–400)
RBC: 2.72 MIL/uL — AB (ref 3.87–5.11)
RDW: 12.8 % (ref 11.5–15.5)
WBC: 10.1 10*3/uL (ref 4.0–10.5)

## 2016-06-09 MED ORDER — ENOXAPARIN SODIUM 30 MG/0.3ML ~~LOC~~ SOLN: 30.0000 mg | SUBCUTANEOUS | 0 refills | Status: DC

## 2016-06-09 MED ORDER — HYDROCODONE-ACETAMINOPHEN 5-325 MG PO TABS: 1.0000 | ORAL_TABLET | Freq: Four times a day (QID) | ORAL | 0 refills | Status: DC | PRN

## 2016-06-09 NOTE — Discharge Summary (Signed)
Physician Discharge Summary  Jessica Booth ZOX:[id number] DOB: 1922/10/26 DOA: 06/03/2016  PCP: Vivien Presto, MD  Admit date: 06/03/2016 Discharge date: 06/09/2016  Time spent: 35 minutes  Recommendations for Outpatient Follow-up:  1. 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 She fell and landed on the left hip.  She was found to have left periprosthetic hip fracture  Hospital Course:  Left periprosthetic hip fracture -Orthopedics consulted -s/p ORIF 5/30 and Wound vac placement by Dr.DUda -seen by PT, SNF recommended -will need lovenox for DVT prophylaxis for 3 weeks -stable for discharge to SNF today  Acute Blood loss anemia -due to post op blood loss and hemodilution -Baseline Hb is 10.5-11, dropped to 8.5 range post op, hemodilution also contributing -Hb of 13 on admission was due to dehydration, hemoconcentration -Hb stable in mid 8 range post op, no active bleeding noted -check CBC in 1 week  S/p CVA with residual left arm weakness -This appears to have occurred during prior hospitalization in 3/18 -restarted plavix  HTN -Continue Lopressor  CAD -Continue Lipitor, metoprolol, plavix  ? Early Dementia vs Cognitive impairment  -suspect this based on my patient interactions over last few  days -angry demeanor throughout this admission   Procedures: PROCEDURE:  OPEN REDUCTION INTERNAL FIXATION (ORIF) LEFT PERIPROSTHETIC FEMUR FRACTURE,  Revision monopolar hemiarthroplasty due to dislocated hip  Application Prevena wound VAC.  Consultations:  Ortho Dr.Duda  Discharge Exam: Vitals:   06/08/16 2231 06/09/16 0505  BP: (!) 109/53 (!) 140/57  Pulse: 74 61  Resp:    Temp: 98.5 F (36.9 C) 98.8 F (37.1 C)    General: AAOx3 Cardiovascular:S1S2/RRR Respiratory: CTAB  Discharge Instructions   Discharge Instructions    Diet - low sodium heart healthy    Complete by:  As directed    Weight bearing as tolerated    Complete by:  As directed    Laterality:  left   Extremity:  Lower     Current Discharge Medication List    START taking these medications   Details  HYDROcodone-acetaminophen (NORCO/VICODIN) 5-325 MG tablet Take 1 tablet by mouth every 6 (six) hours as needed for moderate pain. Qty: 10 tablet, Refills: 0      CONTINUE these medications which have CHANGED   Details  acetaminophen (TYLENOL) 500 MG tablet Take 1 tablet (500 mg total) by mouth every 6 (six) hours as needed for mild pain. Qty: 30 tablet, Refills: 0    enoxaparin (LOVENOX) 30 MG/0.3ML injection Inject 0.3 mLs (30 mg total) into the skin daily. For 3 weeks. Qty: 4.2 mL, Refills: 0      CONTINUE these medications which have NOT CHANGED   Details  citalopram (CELEXA) 20 MG tablet Take 20 mg by mouth daily.    clopidogrel (PLAVIX) 75 MG tablet Take 1 tablet (75 mg total)  by mouth daily.    clotrimazole (MYCELEX) 10 MG troche Take 10 mg by mouth daily as needed (yeast).    feeding supplement, ENSURE ENLIVE, (ENSURE ENLIVE) LIQD Take 237 mLs by mouth 2 (two) times daily between meals. Qty: 237 mL, Refills: 12    loperamide (IMODIUM) 2 MG capsule Take 2 mg by mouth every 6 (six) hours as needed for diarrhea or loose stools.    meclizine (ANTIVERT) 12.5 MG tablet Take 1 tablet  (12.5 mg total) by mouth 2 (two) times daily as needed for dizziness. Qty: 30 tablet, Refills: 0    metoprolol tartrate (LOPRESSOR) 25 MG tablet Take 25 mg by mouth 2 (two) times daily.    senna (SENOKOT) 8.6 MG TABS tablet Take 1 tablet by mouth daily as needed for mild constipation.       Allergies  Allergen Reactions  . Penicillins Hives and Rash    Contact information for follow-up providers    Nadara Mustarduda, Marcus V, MD Follow up in 1 week(s).   Specialty:  Orthopedic Surgery Contact information: [address] [place] [address] [phone]            Contact information for after-discharge care    Destination    [facility] .   Specialty:  Skilled Nursing Facility Contact information: [address] [place] [id number] [phone]                   The results of significant diagnostics from this hospitalization (including imaging, microbiology, ancillary and laboratory) are listed below for reference.    Significant Diagnostic Studies: Ct Head Wo Contrast  Result Date: 06/03/2016 CLINICAL DATA:  Patient fell without syncope. EXAM: CT HEAD WITHOUT CONTRAST CT CERVICAL SPINE WITHOUT CONTRAST TECHNIQUE: Multidetector CT imaging of the head and cervical spine was performed following the standard protocol without intravenous contrast. Multiplanar CT image reconstructions of the cervical spine were also generated. COMPARISON:  05/04/2016 FINDINGS: CT HEAD FINDINGS Brain: Moderate degree of superficial and central atrophy appears chronic and stable with moderate-to-marked periventricular, subcortical and deep white matter are areas of hypodensity consistent with chronic microvascular ischemia. No acute intracranial hemorrhage. No intra-axial mass nor extra-axial fluid collections. Idiopathic basal ganglial calcifications are noted bilaterally. No effacement of the basal cisterns. The fourth ventricle is midline. There is no  hydrocephalus. Vascular: No hyperdense vessel or unexpected calcification. Skull: Negative for fracture. Small osteoma in the right frontal sinus. Sinuses/Orbits: The mastoids are clear. Moderate circumferential mucosal thickening is seen of the sphenoid sinus on the left. Mild mucosal thickening of the ethmoid sinus. Intact orbits and globes with bilateral lens replacements surgeries. Other: None CT CERVICAL SPINE FINDINGS Alignment: Normal cervical lordosis. Skull base and vertebrae: No acute fracture of the vertebral bodies or primary bone lesion. Soft tissues and spinal canal: No prevertebral soft tissue swelling. No visible canal hematoma. Disc levels: Moderate disc space narrowing C4 through C7 with small posterior marginal osteophytes contributing to mild to moderate bilateral neural foraminal encroachment at C4-5 and C5-6. Upper chest: Right apical calcified pleural plaque. Other:  Atherosclerosis of the extracranial carotids. IMPRESSION: 1. Cerebral atrophy with chronic moderate to marked small vessel ischemic disease. No acute intracranial abnormality. 2. Cervical spondylosis without acute posttraumatic fracture nor subluxation. Electronically Signed   By: Tollie Ethavid  Kwon M.D.   On: 06/03/2016 17:41   Ct Cervical Spine Wo Contrast  Result Date: 06/03/2016 CLINICAL DATA:  Patient fell without syncope. EXAM: CT HEAD WITHOUT CONTRAST CT  CERVICAL SPINE WITHOUT CONTRAST TECHNIQUE: Multidetector CT imaging of the head and cervical spine was performed following the standard protocol without intravenous contrast. Multiplanar CT image reconstructions of the cervical spine were also generated. COMPARISON:  05/04/2016 FINDINGS: CT HEAD FINDINGS Brain: Moderate degree of superficial and central atrophy appears chronic and stable with moderate-to-marked periventricular, subcortical and deep white matter are areas of hypodensity consistent with chronic microvascular ischemia. No acute intracranial hemorrhage. No  intra-axial mass nor extra-axial fluid collections. Idiopathic basal ganglial calcifications are noted bilaterally. No effacement of the basal cisterns. The fourth ventricle is midline. There is no hydrocephalus. Vascular: No hyperdense vessel or unexpected calcification. Skull: Negative for fracture. Small osteoma in the right frontal sinus. Sinuses/Orbits: The mastoids are clear. Moderate circumferential mucosal thickening is seen of the sphenoid sinus on the left. Mild mucosal thickening of the ethmoid sinus. Intact orbits and globes with bilateral lens replacements surgeries. Other: None CT CERVICAL SPINE FINDINGS Alignment: Normal cervical lordosis. Skull base and vertebrae: No acute fracture of the vertebral bodies or primary bone lesion. Soft tissues and spinal canal: No prevertebral soft tissue swelling. No visible canal hematoma. Disc levels: Moderate disc space narrowing C4 through C7 with small posterior marginal osteophytes contributing to mild to moderate bilateral neural foraminal encroachment at C4-5 and C5-6. Upper chest: Right apical calcified pleural plaque. Other:  Atherosclerosis of the extracranial carotids. IMPRESSION: 1. Cerebral atrophy with chronic moderate to marked small vessel ischemic disease. No acute intracranial abnormality. 2. Cervical spondylosis without acute posttraumatic fracture nor subluxation. Electronically Signed   By: Tollie Eth M.D.   On: 06/03/2016 17:41   Pelvis Portable  Result Date: 06/05/2016 CLINICAL DATA:  Status post left hip replacement. EXAM: PORTABLE PELVIS 1-2 VIEWS COMPARISON:  Left hip CT dated 06/03/2016. Pelvis radiograph dated 03/27/2016. FINDINGS: Stable left hip placement with interval cerclage wires at the inferior intertrochanteric and subtrochanteric regions. The recently demonstrated fracture at that level is not visible. Stable hardware fixation of the right femur and hip. Diffuse osteopenia. No new fracture or dislocation. IMPRESSION: Interval  cerclage wire placement at the level of the recently demonstrated proximal left femur fracture. Electronically Signed   By: Beckie Salts M.D.   On: 06/05/2016 16:09   Ct Hip Left Wo Contrast  Result Date: 06/03/2016 CLINICAL DATA:  Left hip pain. EXAM: CT OF THE LEFT HIP WITHOUT CONTRAST TECHNIQUE: Multidetector CT imaging of the left hip was performed according to the standard protocol. Multiplanar CT image reconstructions were also generated. COMPARISON:  Radiographs of same day. FINDINGS: Status post left hip arthroplasty. Nondisplaced periprosthetic fracture is noted along the medial cortex of the femur in the trochanteric and subtrochanteric regions. No soft tissue abnormality is noted. IMPRESSION: Nondisplaced periprosthetic fracture is noted along the medial cortex of the femur in the trochanteric and subtrochanteric regions. Electronically Signed   By: Lupita Raider, M.D.   On: 06/03/2016 20:42   Chest Portable 1 View  Result Date: 06/04/2016 CLINICAL DATA:  83 year old female with shortness of breath. Left hip periprostatic fracture. EXAM: PORTABLE CHEST 1 VIEW COMPARISON:  Chest radiograph dated 03/27/2016 FINDINGS: The lungs are clear. There is no pleural effusion or pneumothorax. The cardiac silhouette is within normal limits. Osteopenia. No acute osseous pathology. Right upper quadrant cholecystectomy clips. IMPRESSION: No active disease. Electronically Signed   By: Elgie Collard M.D.   On: 06/04/2016 00:16   Dg C-arm 1-60 Min-no Report  Result Date: 06/05/2016 Fluoroscopy was utilized by the requesting physician.  No radiographic  interpretation.   Dg Hip Unilat W Or Wo Pelvis 2-3 Views Left  Result Date: 06/03/2016 CLINICAL DATA:  Pain after fall EXAM: DG HIP (WITH OR WITHOUT PELVIS) 2-3V LEFT COMPARISON:  03/27/2016 pelvic and preoperative views of the left hip. FINDINGS: On the frog-leg view of the left hip, there is a nondisplaced fracture lucency along the medial aspect of the  sub trochanteric left femur not apparent on the prior comparison studies and therefore suspicious for a new fracture. No joint dislocations are identified. The bony pelvis appears intact. Partially imaged right femoral nail appears intact. There is lower lumbar degenerative disc disease at L4-5 and L5-S1 with associated facet arthropathy. IMPRESSION: 1. Nondisplaced fracture lucency is seen on the frog-leg view of the left hip along the medial subtrochanteric portion of the femur. This is not apparent on the prior comparison studies and is suspicious for new finding. 2. No acute pelvic fracture or joint dislocations. Electronically Signed   By: Tollie Eth M.D.   On: 06/03/2016 18:11    Microbiology: Recent Results (from the past 240 hour(s))  Surgical PCR screen     Status: None   Collection Time: 06/04/16  3:22 AM  Result Value Ref Range Status   MRSA, PCR NEGATIVE NEGATIVE Final   Staphylococcus aureus NEGATIVE NEGATIVE Final    Comment:        The Xpert SA Assay (FDA approved for NASAL specimens in patients over 57 years of age), is one component of a comprehensive surveillance program.  Test performance has been validated by [facility] for patients greater than or equal to 12 year old. It is not intended to diagnose infection nor to guide or monitor treatment.      Labs: Basic Metabolic Panel:  Recent Labs Lab 06/03/16 1737 06/06/16 0701 06/07/16 0724 06/08/16 0037  NA 138 136 135 133*  K 4.4 4.5 3.9 3.9  CL 106 105 104 101  CO2 21* 20* 25 24  GLUCOSE 98 128* 105* 107*  BUN 22* 28* 28* 29*  CREATININE 1.03* 1.13* 1.02* 1.08*  CALCIUM 9.6 8.6* 8.5* 8.2*   Liver Function Tests:  Recent Labs Lab 06/03/16 1737  AST 35  ALT 25  ALKPHOS 64  BILITOT 0.5  PROT 6.9  ALBUMIN 4.3   No results for input(s): LIPASE, AMYLASE in the last 168 hours. No results for input(s): AMMONIA in the last 168 hours. CBC:  Recent Labs Lab 06/03/16 1737 06/06/16 0701  06/07/16 0724 06/08/16 0037 06/09/16 0751  WBC 10.7* 13.3* 10.9* 11.7* 10.1  NEUTROABS 7.2  --   --   --   --   HGB 13.0 9.9* 8.5* 8.2* 8.6*  HCT 38.0 29.1* 25.6* 24.4* 25.8*  MCV 94.5 95.7 95.2 96.4 94.9  PLT 228 201 175 209 290   Cardiac Enzymes: No results for input(s): CKTOTAL, CKMB, CKMBINDEX, TROPONINI in the last 168 hours. BNP: BNP (last 3 results) No results for input(s): BNP in the last 8760 hours.  ProBNP (last 3 results) No results for input(s): PROBNP in the last 8760 hours.  CBG: No results for input(s): GLUCAP in the last 168 hours.     SignedZannie Cove MD.  [facility] 06/09/2016, 10:30 AM

## 2016-06-09 NOTE — Progress Notes (Signed)
[facility] unable to accept pt at this time, however, they will be able to take her in the am.  Unit CSW will be notified and will facilitate d/c in the am.  Maralyn SagoSarah from [facility] will call granddaughter with update.  Pollyann SavoyJody Damia Bobrowski, LCSW Weekend Coverage [phone]

## 2016-06-10 NOTE — Progress Notes (Signed)
[name], female   DOB: 04-Oct-1922, 83 y.o.   MRN: [id number] Patient resting comfortably this morning, states she has no pain, status post internal fixation and revision of a periprosthetic fracture on the left. Weightbearing as tolerated on the left at discharge.

## 2016-06-10 NOTE — Care Management Important Message (Signed)
Important Message  Patient Details  Name: Jessica GaskinsDorothy R. Booth MRN: [id number] Date of Birth: [birth year]   Medicare Important Message Given:  Yes    [name] 06/10/2016, 12:19 PM

## 2016-06-10 NOTE — Progress Notes (Addendum)
Physical Therapy Treatment Patient Details Name: Jessica GaskinsDorothy R. Dicocco MRN: [id number] DOB: July 23, 1922 Today's Date: 06/10/2016    History of Present Illness Patient is a 83 y/o female who presents s/p internal fixation for periprosthetic femur fracture and revision of the hemiarthroplasty LLE after fall. PMH includes CAD s/p stening, HTN, CVA.    PT Comments    Pt performed transfer from bed to chair.  Pt appears anxious during session and angry.  She is upset that her grand daughter has not been by.  Pt remains to require +1 max to total external assistance.  She remains incontinent during transfers.  Pt to d/c to rehab today later this am.     Follow Up Recommendations  SNF;Supervision for mobility/OOB;Supervision/Assistance - 24 hour     Equipment Recommendations  None recommended by PT    Recommendations for Other Services       Precautions / Restrictions Precautions Precautions: Fall Precaution Comments: wound vac Required Braces or Orthoses: Knee Immobilizer - Left Knee Immobilizer - Left: On at all times Restrictions Weight Bearing Restrictions: Yes LLE Weight Bearing: WBAT    Mobility  Bed Mobility Overal bed mobility: Needs Assistance Bed Mobility: Supine to Sit     Supine to sit: Max assist     General bed mobility comments: Pt required assistance to advance B LEs.  Pt able to move RLE.  Use of chux pad to draw hips to edge of bed and assistance to elevate trunk into sitting.    Transfers Overall transfer level: Needs assistance Equipment used: Rolling walker (2 wheeled) Transfers: Squat Pivot Transfers     Squat pivot transfers: Max assist+ 1     General transfer comment: Cues for hand placement.  Pt crying pre transfers with no tears, appears to be behavioral.  Pt able to follow commands.  +2 total to scoot back into recliner.    Ambulation/Gait Ambulation/Gait assistance:  (Pt remains unable.  )               Stairs            Wheelchair  Mobility    Modified Rankin (Stroke Patients Only)       Balance     Sitting balance-Leahy Scale: Poor Sitting balance - Comments: Remains to present with posterior lean and posterior pelvic tilt.       Standing balance-Leahy Scale: Zero                              Cognition Arousal/Alertness: Awake/alert Behavior During Therapy: Anxious Overall Cognitive Status: No family/caregiver present to determine baseline cognitive functioning                                 General Comments: granddaughter present and pt not as anxious.        Exercises      General Comments        Pertinent Vitals/Pain Pain Assessment: Faces Pain Score: 8  Pain Location: LLE with movement for up to edge of chair, chair>bed transfers, bed mobility Pain Descriptors / Indicators: Moaning;Grimacing;Guarding Pain Intervention(s): Monitored during session;Repositioned    Home Living                      Prior Function            PT Goals (current goals can now be found in  the care plan section) Acute Rehab PT Goals Patient Stated Goal: To get better then tell her grand daughter to get out of town.   Potential to Achieve Goals: Fair Progress towards PT goals: Progressing toward goals    Frequency    Min 3X/week      PT Plan Current plan remains appropriate    Co-evaluation              AM-PAC PT "6 Clicks" Daily Activity  Outcome Measure  Difficulty turning over in bed (including adjusting bedclothes, sheets and blankets)?: Total Difficulty moving from lying on back to sitting on the side of the bed? : Total Difficulty sitting down on and standing up from a chair with arms (e.g., wheelchair, bedside commode, etc,.)?: Total Help needed moving to and from a bed to chair (including a wheelchair)?: Total Help needed walking in hospital room?: Total Help needed climbing 3-5 steps with a railing? : Total 6 Click Score: 6    End of Session  Equipment Utilized During Treatment: Gait belt Activity Tolerance: Patient limited by pain Patient left: in chair;with call bell/phone within reach Nurse Communication: Need for lift equipment;Mobility status PT Visit Diagnosis: Pain;Muscle weakness (generalized) (M62.81);Unsteadiness on feet (R26.81) Pain - Right/Left: Left Pain - part of body: Leg     Time: 1914-7829 PT Time Calculation (min) (ACUTE ONLY): 35 min  Charges:  $Therapeutic Activity: 23-37 mins                    G Codes:       [name], PTA pager [phone]    [name] 06/10/2016, 9:54 AM

## 2016-06-10 NOTE — Care Management Important Message (Signed)
Important Message  Patient Details  Name: Jessica Booth MRN: [id number] Date of Birth: Sep 21, 1922   Medicare Important Message Given:  Yes    Elliot CousinShavis, Margues Filippini Ellen, RN 06/10/2016, 11:24 AM

## 2016-06-10 NOTE — Progress Notes (Signed)
[facility], given to nurse Raynelle HighlandKimberly Boyd.  Transport scheduled for 11am.  Granddaughter aware patient to go to facility.

## 2016-06-10 NOTE — Social Work (Signed)
Clinical Social Worker facilitated patient discharge including contacting patient family and facility to confirm patient discharge plans.  Clinical information faxed to facility and family agreeable with plan.  CSW arranged [facility].  RN to call [phone] report prior to discharge.  Clinical Social Worker will sign off for now as social work intervention is no longer needed. Please consult us again if new need arises.  Keene BreathPatricia Wiletta Bermingham, LCSW Clinical Social Worker [phone]

## 2016-06-10 NOTE — Care Management Note (Addendum)
Case Management Note  Patient Details  Name: Jessica Booth MRN: [id number] Date of Birth: 1922/06/26  Subjective/Objective:  Periprosthetic fracture around internal prosthetic left hip joint , s/p hip hemiathroplasty                 Action/Plan: Discharge Planning: Chart reviewed. Pt has a surgical wound vac. Scheduled dc to SNF rehab. CSW following for SNF placement.   PCP Delano MetzORRINGTON, KIP A   Expected Discharge Date:  06/09/16               Expected Discharge Plan:  Skilled Nursing Facility  In-House Referral:  Clinical Social Work  Discharge planning Services  CM Consult  Post Acute Care Choice:  NA Choice offered to:  NA  DME Arranged:  N/A DME Agency:  NA  HH Arranged:  NA HH Agency:  NA  Status of Service:  Completed, signed off  If discussed at [employer] Length of Stay Meetings, dates discussed:    Additional Comments:  Elliot CousinShavis, Lesa Vandall Ellen, RN 06/10/2016, 11:27 AM

## 2016-06-10 NOTE — Progress Notes (Signed)
Physical Therapy Treatment Patient Details Name: Jessica Booth MRN: [id number] DOB: May 21, 1922 Today's Date: 06/10/2016    History of Present Illness Patient is a 83 y/o female who presents s/p internal fixation for periprosthetic femur fracture and revision of the hemiarthroplasty LLE after fall. PMH includes CAD s/p stening, HTN, CVA.    PT Comments    Assisted patient with back to bed transfers.  Pt tolerated back to bed transfer better today.  Pt ready to d/c and EMS waiting outside door after transfer.     Follow Up Recommendations  SNF;Supervision for mobility/OOB;Supervision/Assistance - 24 hour     Equipment Recommendations  None recommended by PT    Recommendations for Other Services       Precautions / Restrictions Precautions Precautions: Fall Precaution Comments: wound vac Required Braces or Orthoses: Knee Immobilizer - Left Knee Immobilizer - Left: On at all times Restrictions Weight Bearing Restrictions: Yes LLE Weight Bearing: Weight bearing as tolerated    Mobility  Bed Mobility Overal bed mobility: Needs Assistance Bed Mobility: Sit to Supine;Rolling Rolling: Mod assist   Sit to supine: Max assist   General bed mobility comments: Required assistance with LLE back to bed, patient able to manuever upper trunk with gravity assisting.  Pt then required =2 total to boost up into bed.    Transfers Overall transfer level: Needs assistance Equipment used: Rolling walker (2 wheeled) Transfers: Squat Pivot Transfers Sit to Stand: Max assist   Squat pivot transfers: Max assist     General transfer comment: Cues for hand placement to and from seated surface.  Pt with better ability to follow commands.    Ambulation/Gait Ambulation/Gait assistance:  (Pt unable )               Stairs            Wheelchair Mobility    Modified Rankin (Stroke Patients Only)       Balance Overall balance assessment: Needs assistance   Sitting  balance-Leahy Scale: Poor Sitting balance - Comments: Remains to present with posterior lean and posterior pelvic tilt.     Standing balance support: During functional activity Standing balance-Leahy Scale: Zero                              Cognition Arousal/Alertness: Awake/alert Behavior During Therapy: WFL for tasks assessed/performed Overall Cognitive Status: No family/caregiver present to determine baseline cognitive functioning                                 General Comments: granddaughter present and pt not as anxious.        Exercises      General Comments        Pertinent Vitals/Pain Pain Assessment: Faces Pain Score: 4  Pain Location: LLE with movement for up to edge of chair, chair>bed transfers, bed mobility Pain Descriptors / Indicators: Discomfort Pain Intervention(s): Limited activity within patient's tolerance;Repositioned    Home Living                      Prior Function            PT Goals (current goals can now be found in the care plan section) Acute Rehab PT Goals Patient Stated Goal: To go to rehab and see her grand daughter.  Potential to Achieve Goals: Fair Progress towards PT goals:  Progressing toward goals    Frequency    Min 3X/week      PT Plan Current plan remains appropriate    Co-evaluation              AM-PAC PT "6 Clicks" Daily Activity  Outcome Measure  Difficulty turning over in bed (including adjusting bedclothes, sheets and blankets)?: Total Difficulty moving from lying on back to sitting on the side of the bed? : Total Difficulty sitting down on and standing up from a chair with arms (e.g., wheelchair, bedside commode, etc,.)?: Total Help needed moving to and from a bed to chair (including a wheelchair)?: Total Help needed walking in hospital room?: Total Help needed climbing 3-5 steps with a railing? : Total 6 Click Score: 6    End of Session Equipment Utilized During  Treatment: Gait belt Activity Tolerance: Patient limited by pain Patient left: in chair;with call bell/phone within reach Nurse Communication: Need for lift equipment;Mobility status PT Visit Diagnosis: Pain;Muscle weakness (generalized) (M62.81);Unsteadiness on feet (R26.81) Pain - Right/Left: Left Pain - part of body: Leg     Time: [id number] PT Time Calculation (min) (ACUTE ONLY): 15 min  Charges:  $Therapeutic Activity: 8-22 mins                    G Codes:       Joycelyn RuaAimee Jashad Depaula, PTA pager [phone]    Florestine Aversimee J Zully Frane 06/10/2016, 11:40 AM

## 2016-06-10 NOTE — Clinical Social Work Placement (Addendum)
   CLINICAL SOCIAL WORK PLACEMENT  NOTE  Date:  06/10/2016  Patient Details  Name: Jessica Booth MRN: [id number] Date of Birth: December 17, 1922  Clinical Social Work is seeking post-discharge placement for this patient at the Skilled  Nursing Facility level of care (*CSW will initial, date and re-position this form in  chart as items are completed):  Yes   Patient/family provided with [facility] Clinical Social Work Department's list of facilities offering this level of care within the geographic area requested by the patient (or if unable, by the patient's family).  Yes   Patient/family informed of their freedom to choose among providers that offer the needed level of care, that participate in Medicare, Medicaid or managed care program needed by the patient, have an available bed and are willing to accept the patient.  Yes   Patient/family informed of [facility]'s ownership interest in [facility] and [facility], as well as of the fact that they are under no obligation to receive care at these facilities.  PASRR submitted to EDS on       PASRR number received on 06/07/16     Existing PASRR number confirmed on 06/07/16     FL2 transmitted to all facilities in geographic area requested by pt/family on 06/07/16     FL2 transmitted to all facilities within larger geographic area on 06/07/16     Patient informed that his/her managed care company has contracts with or will negotiate with certain facilities, including the following:            Patient/family informed of bed offers received.  [facility]     Physician recommends and patient chooses bed at      [facility].  Patient to be transferred to facility by    PTAR  Patient family notified on 06/10/16 of transfer.  Name of family member notified:  Marylene LandAngela     PHYSICIAN Please prepare priority discharge summary, including medications, Please  prepare prescriptions, Please sign DNR, Please sign FL2     Additional Comment:    _______________________________________________ Tresa MoorePatricia V Quantel Mcinturff, LCSW 06/10/2016, 8:52 AM

## 2016-06-14 ENCOUNTER — Ambulatory Visit (INDEPENDENT_AMBULATORY_CARE_PROVIDER_SITE_OTHER): Payer: Medicare Other

## 2016-06-14 ENCOUNTER — Ambulatory Visit (INDEPENDENT_AMBULATORY_CARE_PROVIDER_SITE_OTHER): Payer: Medicare Other | Admitting: Family

## 2016-06-14 DIAGNOSIS — M25552 Pain in left hip: Secondary | ICD-10-CM | POA: Diagnosis not present

## 2016-06-14 DIAGNOSIS — M9702XD Periprosthetic fracture around internal prosthetic left hip joint, subsequent encounter: Secondary | ICD-10-CM

## 2016-06-14 NOTE — Progress Notes (Signed)
   Post-Op Visit Note   Patient: Jessica GaskinsDorothy R. Booth           Date of Birth: 1922-05-07           MRN: [id number] Visit Date: 06/14/2016 PCP: Vivien Prestoorrington, Kip A, MD  Chief Complaint:  Chief Complaint  Patient presents with  . Left Hip - Routine Post Op    06/05/16 left ORIF periprosthetic femur fracture    HPI:  Patient is a 83 year old woman who presents today one week status post ORIF for a left periprosthetic femur fracture. [facility]. Her granddaughter attends her appointment. Her wound VAC did turn off last night. Vac was removed today in office.  Has made slow progress with therapy. States feels weak with standing. So far has not walked. States is working on standing.   Is in a knee immobilizer.     Ortho Exam  Incision well proximally with staples this is healing well there is no gaping noted drainage no surrounding erythema. Painless range of motion left hip.   Visit Diagnoses:  1. Periprosthetic fracture around internal prosthetic left hip joint, subsequent encounter     Plan: weight bearing as tolerated. Continue immobilizer for 2 more weeks. Have provided order for SNF to harvest staples in 1 week. Will follow up with repeat radiographs of left hip. Cleanse incision daily. Apply dry dressing.   Follow-Up Instructions: Return in about 2 weeks (around 06/28/2016).   Imaging: Xr Hip Unilat W Or W/o Pelvis 2-3 Views Left  Result Date: 06/14/2016 Stable alignment of left hip prosthesis. Cerclage cables seen x 2, well aligned. No complicating features.    Orders:  Orders Placed This Encounter  Procedures  . XR HIP UNILAT W OR W/O PELVIS 2-3 VIEWS LEFT   No orders of the defined types were placed in this encounter.    PMFS History: Patient Active Problem List   Diagnosis Date Noted  . Periprosthetic fracture around internal prosthetic left hip joint (HCC) 06/03/2016  . Left arm weakness   . S/P hip  hemiarthroplasty   . Stroke (cerebrum) (HCC) 03/28/2016  . Essential hypertension 03/27/2016  . CAD (coronary artery disease) 03/27/2016   Past Medical History:  Diagnosis Date  . Abnormal liver function test   . Coronary artery disease 2004   stent RCA  . Hyperlipidemia   . Hypertension   . Irritable bowel syndrome   . Stroke [facility](HCC)     Family History  Problem Relation Age of Onset  . Hypertension Sister     Past Surgical History:  Procedure Laterality Date  . FEMUR FRACTURE SURGERY    . HIP ARTHROPLASTY Left 03/27/2016   Procedure: ARTHROPLASTY BIPOLAR HIP (HEMIARTHROPLASTY);  Surgeon: Nadara MustardMarcus Duda V, MD;  [facility];  Service: Orthopedics;  Laterality: Left;  . ORIF PERIPROSTHETIC FRACTURE Left 06/05/2016   Procedure: OPEN REDUCTION INTERNAL FIXATION (ORIF) LEFT PERIPROSTHETIC FEMUR FRACTURE;  Surgeon: Nadara Mustarduda, Marcus V, MD;  [facility];  Service: Orthopedics;  Laterality: Left;   Social History   Occupational History  . retired    Social History Main Topics  . Smoking status: Never Smoker  . Smokeless tobacco: Never Used  . Alcohol use Yes     Comment: beer rarely  . Drug use: Unknown  . Sexual activity: Not on file

## 2016-06-20 ENCOUNTER — Inpatient Hospital Stay (INDEPENDENT_AMBULATORY_CARE_PROVIDER_SITE_OTHER): Payer: Medicare Other | Admitting: Orthopedic Surgery

## 2016-06-28 ENCOUNTER — Ambulatory Visit (INDEPENDENT_AMBULATORY_CARE_PROVIDER_SITE_OTHER): Payer: Medicare Other | Admitting: Orthopedic Surgery

## 2016-06-28 ENCOUNTER — Encounter (INDEPENDENT_AMBULATORY_CARE_PROVIDER_SITE_OTHER): Payer: Self-pay | Admitting: Orthopedic Surgery

## 2016-06-28 ENCOUNTER — Ambulatory Visit (INDEPENDENT_AMBULATORY_CARE_PROVIDER_SITE_OTHER): Payer: No Typology Code available for payment source

## 2016-06-28 VITALS — Ht 63.0 in | Wt 103.0 lb

## 2016-06-28 DIAGNOSIS — M9702XD Periprosthetic fracture around internal prosthetic left hip joint, subsequent encounter: Secondary | ICD-10-CM

## 2016-06-28 NOTE — Progress Notes (Signed)
Office Visit Note   Patient: Jessica Booth           Date of Birth: Mar 11, 1922           MRN: [id number] Visit Date: 06/28/2016              Requested by: Vivien Prestoorrington, Kip A, MD [phone] [address] [place], [place] [address] PCP: Vivien Prestoorrington, Kip A, MD  Chief Complaint  Patient presents with  . Left Hip - Routine Post Op    06/05/16 ORIF Internal Fixation left periprosthetic femur fracture 23 days post op.      HPI: Patient presents 3 weeks status post open reduction internal fixation left periprosthetic femur fracture. Patient denies any hip pain she states she does have some knee pain.  Assessment & Plan: Visit Diagnoses:  1. Periprosthetic fracture around internal prosthetic left hip joint, subsequent encounter     Plan: Recommended discontinuing the knee immobilizer physical therapy weightbearing as tolerated  Follow-Up Instructions: Return if symptoms worsen or fail to improve.   Ortho Exam  Patient is alert, oriented, no adenopathy, well-dressed, normal affect, normal respiratory effort. Examination patient's leg is straight radiographs shows stable internal fixation. The incision is well-healed.  Imaging: Xr Hip Unilat W Or W/o Pelvis 2-3 Views Left  Result Date: 06/28/2016 Two-view radiographs the left hip shows stable internal fixation for periprosthetic proximal femur fracture. The joint reduced no recurrence of the dislocation.   Labs: Lab Results  Component Value Date   HGBA1C 5.1 03/29/2016   REPTSTATUS 03/24/2014 FINAL 03/21/2014   CULT  03/21/2014    KLEBSIELLA PNEUMONIAE Performed at [facility]    LABORGA KLEBSIELLA PNEUMONIAE 03/21/2014    Orders:  Orders Placed This Encounter  Procedures  . XR HIP UNILAT W OR W/O PELVIS 2-3 VIEWS LEFT   No orders of the defined types were placed in this encounter.    Procedures: No procedures performed  Clinical Data: No additional findings.  ROS:  All other systems negative, except as  noted in the HPI. Review of Systems  Objective: Vital Signs: Ht 5\' 3"  (1.6 m)   Wt 103 lb (46.7 kg)   BMI 18.25 kg/m   Specialty Comments:  No specialty comments available.  PMFS History: Patient Active Problem List   Diagnosis Date Noted  . Periprosthetic fracture around internal prosthetic left hip joint (HCC) 06/03/2016  . Left arm weakness   . S/P hip hemiarthroplasty   . Stroke (cerebrum) (HCC) 03/28/2016  . Essential hypertension 03/27/2016  . CAD (coronary artery disease) 03/27/2016   Past Medical History:  Diagnosis Date  . Abnormal liver function test   . Coronary artery disease 2004   stent RCA  . Hyperlipidemia   . Hypertension   . Irritable bowel syndrome   . Stroke [facility](HCC)     Family History  Problem Relation Age of Onset  . Hypertension Sister     Past Surgical History:  Procedure Laterality Date  . FEMUR FRACTURE SURGERY    . HIP ARTHROPLASTY Left 03/27/2016   Procedure: ARTHROPLASTY BIPOLAR HIP (HEMIARTHROPLASTY);  Surgeon: Nadara MustardMarcus Duda V, MD;  [facility];  Service: Orthopedics;  Laterality: Left;  . ORIF PERIPROSTHETIC FRACTURE Left 06/05/2016   Procedure: OPEN REDUCTION INTERNAL FIXATION (ORIF) LEFT PERIPROSTHETIC FEMUR FRACTURE;  Surgeon: Nadara Mustarduda, Marcus V, MD;  [facility];  Service: Orthopedics;  Laterality: Left;   Social History   Occupational History  . retired    Social History Main Topics  .  Smoking status: Never Smoker  . Smokeless tobacco: Never Used  . Alcohol use Yes     Comment: beer rarely  . Drug use: Unknown  . Sexual activity: Not on file

## 2016-08-06 ENCOUNTER — Emergency Department (HOSPITAL_COMMUNITY): Payer: Medicare Other

## 2016-08-06 ENCOUNTER — Emergency Department (HOSPITAL_COMMUNITY)
Admission: EM | Admit: 2016-08-06 | Discharge: 2016-08-06 | Disposition: A | Discharge status: Skilled Nursing Facility | Payer: Medicare Other | Attending: Emergency Medicine | Admitting: Emergency Medicine

## 2016-08-06 ENCOUNTER — Encounter (HOSPITAL_COMMUNITY): Payer: Self-pay | Admitting: Emergency Medicine

## 2016-08-06 DIAGNOSIS — Z7901 Long term (current) use of anticoagulants: Secondary | ICD-10-CM | POA: Diagnosis not present

## 2016-08-06 DIAGNOSIS — Z955 Presence of coronary angioplasty implant and graft: Secondary | ICD-10-CM | POA: Diagnosis not present

## 2016-08-06 DIAGNOSIS — I10 Essential (primary) hypertension: Secondary | ICD-10-CM | POA: Insufficient documentation

## 2016-08-06 DIAGNOSIS — I2510 Atherosclerotic heart disease of native coronary artery without angina pectoris: Secondary | ICD-10-CM | POA: Insufficient documentation

## 2016-08-06 DIAGNOSIS — Z79899 Other long term (current) drug therapy: Secondary | ICD-10-CM | POA: Insufficient documentation

## 2016-08-06 DIAGNOSIS — M545 Low back pain, unspecified: Secondary | ICD-10-CM

## 2016-08-06 DIAGNOSIS — M25552 Pain in left hip: Secondary | ICD-10-CM

## 2016-08-06 DIAGNOSIS — M25572 Pain in left ankle and joints of left foot: Secondary | ICD-10-CM | POA: Insufficient documentation

## 2016-08-06 NOTE — ED Notes (Signed)
Attempted to ambulate pt, however she sts she can't walk and is wheelchair bound.

## 2016-08-06 NOTE — ED Notes (Signed)
[facility] called for transport.  

## 2016-08-06 NOTE — ED Provider Notes (Signed)
[facility]-EMERGENCY DEPT Provider Note   CSN: [id number] Arrival date & time: 08/06/16  0106     History   Chief Complaint Chief Complaint  Patient presents with  . Hip Pain    HPI Jessica Booth is a 83 y.o. female.  The history is provided by the patient.  Hip Pain  This is a recurrent problem. Episode onset: unknown. The problem occurs constantly. The problem has been gradually worsening. Pertinent negatives include no abdominal pain. Exacerbated by: movement. The symptoms are relieved by rest.  pt presents with left hip and back pain No falls She reports it hurts to move  She has had difficulty walking No other complaints are reported   Past Medical History:  Diagnosis Date  . Abnormal liver function test   . Coronary artery disease 2004   stent RCA  . Hyperlipidemia   . Hypertension   . Irritable bowel syndrome   . Stroke [facility])     Patient Active Problem List   Diagnosis Date Noted  . Periprosthetic fracture around internal prosthetic left hip joint (HCC) 06/03/2016  . Left arm weakness   . S/P hip hemiarthroplasty   . Stroke (cerebrum) (HCC) 03/28/2016  . Essential hypertension 03/27/2016  . CAD (coronary artery disease) 03/27/2016    Past Surgical History:  Procedure Laterality Date  . FEMUR FRACTURE SURGERY    . HIP ARTHROPLASTY Left 03/27/2016   Procedure: ARTHROPLASTY BIPOLAR HIP (HEMIARTHROPLASTY);  Surgeon: Nadara Mustard, MD;  Location: [facility] OR;  Service: Orthopedics;  Laterality: Left;  . ORIF PERIPROSTHETIC FRACTURE Left 06/05/2016   Procedure: OPEN REDUCTION INTERNAL FIXATION (ORIF) LEFT PERIPROSTHETIC FEMUR FRACTURE;  Surgeon: Nadara Mustard, MD;  Location: [facility] OR;  Service: Orthopedics;  Laterality: Left;    OB History    No data available       Home Medications    Prior to Admission medications   Medication Sig Start Date End Date Taking? Authorizing Provider  acetaminophen (TYLENOL) 500 MG tablet Take 1 tablet (500 mg total) by mouth every  6 (six) hours as needed for mild pain. 06/06/16   Nadara Mustard, MD  citalopram (CELEXA) 20 MG tablet Take 20 mg by mouth daily.    [provider]  clopidogrel (PLAVIX) 75 MG tablet Take 1 tablet (75 mg total) by mouth daily. 03/30/16   Osvaldo Shipper, MD  clotrimazole (MYCELEX) 10 MG troche Take 10 mg by mouth daily as needed (yeast).    [provider]  enoxaparin (LOVENOX) 30 MG/0.3ML injection Inject 0.3 mLs (30 mg total) into the skin daily. For 3 weeks. 06/09/16 06/23/16  Zannie Cove, MD  feeding supplement, ENSURE ENLIVE, (ENSURE ENLIVE) LIQD Take 237 mLs by mouth 2 (two) times daily between meals. 03/30/16   Osvaldo Shipper, MD  HYDROcodone-acetaminophen (NORCO/VICODIN) 5-325 MG tablet Take 1 tablet by mouth every 6 (six) hours as needed for moderate pain. 06/09/16   Zannie Cove, MD  loperamide (IMODIUM) 2 MG capsule Take 2 mg by mouth every 6 (six) hours as needed for diarrhea or loose stools.    [provider]  meclizine (ANTIVERT) 12.5 MG tablet Take 1 tablet (12.5 mg total) by mouth 2 (two) times daily as needed for dizziness. 03/30/16   Osvaldo Shipper, MD  metoprolol tartrate (LOPRESSOR) 25 MG tablet Take 25 mg by mouth 2 (two) times daily.    [provider]  senna (SENOKOT) 8.6 MG TABS tablet Take 1 tablet by mouth daily as needed for mild constipation.  [provider]    Family History Family History  Problem Relation Age of Onset  . Hypertension Sister     Social History Social History  Substance Use Topics  . Smoking status: Never Smoker  . Smokeless tobacco: Never Used  . Alcohol use Yes     Comment: beer rarely     Allergies   Penicillins   Review of Systems Review of Systems  Constitutional: Negative for fever.  Gastrointestinal: Negative for abdominal pain.  Musculoskeletal: Positive for arthralgias and back pain.  All other systems reviewed and are negative.    Physical Exam Updated Vital Signs BP  (!) 158/75 (BP Location: Right Arm)   Pulse (!) 57   Temp 98.5 F (36.9 C) (Oral)   Resp 14   Ht 1.6 m (5\' 3" )   Wt 54.4 kg (120 lb)   SpO2 100%   BMI 21.26 kg/m   Physical Exam CONSTITUTIONAL: elderly, no acute distress HEAD: Normocephalic/atraumatic EYES: EOMI ENMT: Mucous membranes moist NECK: supple no meningeal signs SPINE/BACK: lumbar spinal tenderness CV: S1/S2 noted, no murmurs/rubs/gallops noted LUNGS: Lungs are clear to auscultation bilaterally, no apparent distress ABDOMEN: soft, nontender  NEURO: Pt is awake/alert/appropriate, moves all extremitiesx4.  No facial droop.   EXTREMITIES: pulses normal/equal, full ROM, tenderness to palpation and ROM of left hip.  Pelvis stable.  ?deformtiy to left ankle.  Distal pulses intact All other extremities/joints palpated/ranged and nontender SKIN: warm, color normal PSYCH: no abnormalities of mood noted, alert and oriented to situation   ED Treatments / Results  Labs (all labs ordered are listed, but only abnormal results are displayed) Labs Reviewed - No data to display  EKG  EKG Interpretation None       Radiology Dg Lumbar Spine Complete  Result Date: 08/06/2016 CLINICAL DATA:  Left hip pain radiating down the leg since 07/30. No recent injury. Low back and left ankle pain. EXAM: LUMBAR SPINE - COMPLETE 4+ VIEW COMPARISON:  None. FINDINGS: Diffuse bone demineralization. Lumbar scoliosis convex towards the right. Diffuse degenerative changes throughout the lumbar spine with narrowed lumbar interspaces and associated endplate hypertrophic changes. No anterior subluxation. No vertebral compression deformities. Vascular calcifications. Partial visualization of a left hip arthroplasty. IMPRESSION: Degenerative changes in the lumbar spine with scoliosis convex towards the right. No acute displaced fractures identified. Electronically Signed   By: Burman NievesWilliam  Stevens M.D.   On: 08/06/2016 03:08   Dg Ankle Complete Left  Result  Date: 08/06/2016 CLINICAL DATA:  Left ankle pain.  No known injury. EXAM: LEFT ANKLE COMPLETE - 3+ VIEW COMPARISON:  None. FINDINGS: Diffuse bone demineralization. There is no evidence of fracture, dislocation, or joint effusion. There is no evidence of arthropathy or other focal bone abnormality. Soft tissues are unremarkable. Vascular calcifications. IMPRESSION: No acute bony abnormalities. Electronically Signed   By: Burman NievesWilliam  Stevens M.D.   On: 08/06/2016 03:09   Dg Hip Unilat With Pelvis 2-3 Views Left  Result Date: 08/06/2016 CLINICAL DATA:  83 year old female with left hip pain. No known injury. EXAM: DG HIP (WITH OR WITHOUT PELVIS) 2-3V LEFT COMPARISON:  Radiograph dated 06/03/2016 FINDINGS: There is a left hip hemiarthroplasty with interval placement of the cerclage wire at the subtrochanteric and proximal left femur since the prior radiograph. Right femoral intramedullary rod and transcervical screw partially visualized. There is no acute fracture or dislocation. The bones are osteopenic. There is heterotopic bone formation at the right femoral neck. The soft tissues appear unremarkable. There is degenerative changes of the lower  lumbar spine. IMPRESSION: 1. No acute fracture or dislocation. 2. Interval placement of cerclage wires at the proximal left femur. Electronically Signed   By: Elgie CollardArash  Radparvar M.D.   On: 08/06/2016 03:09    Procedures Procedures (including critical care time)  Medications Ordered in ED Medications - No data to display   Initial Impression / Assessment and Plan / ED Course  I have reviewed the triage vital signs and the nursing notes.  Pertinent imaging results that were available during my care of the patient were reviewed by me and considered in my medical decision making (see chart for details).     Pt poor historian Here with atraumatic left hip pain with previous h/o replacement Imaging pending at this time 3:48 AM [facility] She  reports pt had onset of left hip pain yesterday No fall reported She usually does not report hip pain She was sent for evaluation She is wheelchair bound   Will d/c patient back to facility No other acute injury noted Pt stable in the ED Final Clinical Impressions(s) / ED Diagnoses   Final diagnoses:  Left hip pain    New Prescriptions New Prescriptions   No medications on file     Zadie RhineWickline, Loring Liskey, MD 08/06/16 [phone]

## 2016-08-06 NOTE — ED Notes (Signed)
Bed: WA21 Expected date:  Expected time:  Means of arrival:  Comments: 83 yr old hip pain

## 2016-08-06 NOTE — ED Notes (Signed)
Pt keeps calling out, stating she is in the wrong hospital, and needs to [facility]. Tried to explain to pt that she is ready for discharge, however pt doesn't seem to comprehend and is repeating "my leg Is broken and they will operate on me now".

## 2016-08-06 NOTE — ED Triage Notes (Signed)
Pt via EMS [facility].  Reports L hip/L upper leg pain.  Staff states that a few hours ago she started complaining of pain, given 500 mg Tylenol at 10 pm.  Pain did not change, POA asked for pt to be transported.  No recent falls or trauma.  A&Ox4.  HOH.  Uses wheelchair per norm.  Hx L hip replacement.  No obvious deformity or crepitus. VS: 169/76, 68 bpm, 97% RA, 16 RR

## 2017-09-17 ENCOUNTER — Encounter: Payer: Self-pay | Admitting: Neurology

## 2017-09-19 ENCOUNTER — Non-Acute Institutional Stay: Payer: Medicare Other | Admitting: Nurse Practitioner

## 2017-09-19 DIAGNOSIS — R269 Unspecified abnormalities of gait and mobility: Secondary | ICD-10-CM

## 2017-09-19 DIAGNOSIS — Z515 Encounter for palliative care: Secondary | ICD-10-CM

## 2017-09-19 DIAGNOSIS — F0391 Unspecified dementia with behavioral disturbance: Secondary | ICD-10-CM

## 2017-09-19 DIAGNOSIS — R634 Abnormal weight loss: Secondary | ICD-10-CM

## 2017-09-22 ENCOUNTER — Encounter: Payer: Self-pay | Admitting: Nurse Practitioner

## 2017-09-22 NOTE — Progress Notes (Signed)
PALLIATIVE CARE CONSULT VISIT   PATIENT NAME: Jessica Booth DOB: 10-Jun-1922 MRN: [id number]  PRIMARY CARE PROVIDER:   Curly Rim, MD  REFERRING PROVIDER:  Curly Rim, MD [address] [place], [place] [address]  RESPONSIBLE PARTY:   Criss Alvine (grandaughter) [phone]  Interim history: Met with patient and grandaughter in patient's room; grand-daughter given update ASSESSMENT/RECOMMENDATIONS and PLAN:  -Dementia with behavioral disturbnace  -depression -failure to thrive/weight loss   -seen by [facility] -followed by psychiatry -Recommend EKG to monitor QTc -continue abilify 90m; grand-daughter reports improved mood since started (not as tearful) -celexa 257mdaily  Chronic pain -patient reports pain only with transfers from bed to chair and vice versa; otherwise no pain -change ultram to PRN -continue tylenol  Chronic medical problems -CAD s/p stent -S/p CVA w left hemiparesis - L hip FX s/p hemiarthroplasty -HTN -HLD and diarrhea -IBS/constipation -Managed by primary care MD -plavix -metoprolol 2574mID; consider decreasing to 12.5mg2mD based on BP readings -psyllium ,senokot and PRN imodium    ACP -DNR/MOST    I spent 45 minutes providing this consultation,  from 14:00 to 14:45. More than 50% of the time in this consultation was spent coordinating communication.   HISTORY OF PRESENT ILLNESS:  Jessica Booth 89 y68. year old female with multiple medical problems including dementia with behavioral disturbance, hallucinations, delusions, gait disorder, depression, anxiety, suicidal ideation, hearing loss. Palliative Care was asked to help with symptom managment address goals of care.   CODE STATUS: DNR/MOST  PPS: 50% HOSPICE ELIGIBILITY/DIAGNOSIS: TBD  PAST MEDICAL HISTORY:  Past Medical History:  Diagnosis Date  . Abnormal liver function test   . Coronary artery disease 2004   stent RCA  . Hyperlipidemia   .  Hypertension   . Irritable bowel syndrome   . [facility]  SOCIAL HX:  Social History   Tobacco Use  . Smoking status: Never Smoker  . Smokeless tobacco: Never Used  Substance Use Topics  . Alcohol use: Yes    Comment: beer rarely    ALLERGIES:  Allergies  Allergen Reactions  . Penicillins Hives and Rash     PERTINENT MEDICATIONS:  Outpatient Encounter Medications as of 09/19/2017  Medication Sig  . acetaminophen (TYLENOL) 500 MG tablet Take 1 tablet (500 mg total) by mouth every 6 (six) hours as needed for mild pain.  . citalopram (CELEXA) 20 MG tablet Take 20 mg by mouth daily.  . clopidogrel (PLAVIX) 75 MG tablet Take 1 tablet (75 mg total) by mouth daily.  . feeding supplement, ENSURE ENLIVE, (ENSURE ENLIVE) LIQD Take 237 mLs by mouth 2 (two) times daily between meals. (Patient taking differently: Take 237 mLs by mouth daily. )  . HYDROcodone-acetaminophen (NORCO/VICODIN) 5-325 MG tablet Take 1 tablet by mouth every 6 (six) hours as needed for moderate pain. (Patient not taking: Reported on 08/06/2016)  . lactobacillus acidophilus (BACID) TABS tablet Take 2 tablets by mouth daily.  . loMarland Kitcheneramide (IMODIUM) 2 MG capsule Take 2 mg by mouth every 6 (six) hours as needed for diarrhea or loose stools.  . meclizine (ANTIVERT) 12.5 MG tablet Take 1 tablet (12.5 mg total) by mouth 2 (two) times daily as needed for dizziness.  . metoprolol tartrate (LOPRESSOR) 25 MG tablet Take 25 mg by mouth 2 (two) times daily.  . Psyllium 500 MG CAPS Take 1 capsule by mouth daily.  . risperiDONE (RISPERDAL) 0.25 MG tablet Take 0.25 mg  by mouth 2 (two) times daily.  Marland Kitchen senna (SENOKOT) 8.6 MG TABS tablet Take 1 tablet by mouth daily as needed for mild constipation.   No facility-administered encounter medications on file as of 09/19/2017.     PHYSICAL EXAM:   General: NAD, frail appearing, thin Cardiovascular: regular rate and rhythm Pulmonary: clear ant fields Abdomen: soft, nontender, + bowel  sounds GU: no suprapubic tenderness Extremities: no edema, no joint deformities Skin: no rashes Neurological: Weakness but otherwise nonfocal  [name], NP

## 2017-09-24 ENCOUNTER — Encounter: Payer: Medicare Other | Admitting: Nurse Practitioner

## 2017-09-24 ENCOUNTER — Telehealth: Payer: Self-pay

## 2017-09-24 NOTE — Telephone Encounter (Signed)
Phone call received from Ardine EngJennifer RN at facility relaying information that daughter, Jessica Booth, requesting a phone call. [facility] and provided update that NP will see patient today. Meeting scheduled for 10/01/17 @ facility

## 2017-09-25 ENCOUNTER — Non-Acute Institutional Stay: Payer: Medicare Other | Admitting: Nurse Practitioner

## 2017-09-25 DIAGNOSIS — F419 Anxiety disorder, unspecified: Secondary | ICD-10-CM

## 2017-09-25 DIAGNOSIS — I159 Secondary hypertension, unspecified: Secondary | ICD-10-CM

## 2017-09-25 DIAGNOSIS — F339 Major depressive disorder, recurrent, unspecified: Secondary | ICD-10-CM

## 2017-09-25 DIAGNOSIS — G8929 Other chronic pain: Secondary | ICD-10-CM

## 2017-09-25 DIAGNOSIS — F0391 Unspecified dementia with behavioral disturbance: Secondary | ICD-10-CM

## 2017-09-25 DIAGNOSIS — Z515 Encounter for palliative care: Secondary | ICD-10-CM

## 2017-09-25 DIAGNOSIS — R269 Unspecified abnormalities of gait and mobility: Secondary | ICD-10-CM

## 2017-09-25 DIAGNOSIS — R634 Abnormal weight loss: Secondary | ICD-10-CM

## 2017-09-25 NOTE — Progress Notes (Signed)
PALLIATIVE CARE CONSULT VISIT   PATIENT NAME: Jessica Booth DOB: [birth year] MRN: [id number]  PRIMARY CARE PROVIDER:   Vivien Prestoorrington, Kip A, MD  REFERRING PROVIDER:  Vivien Prestoorrington, Kip A, MD [id number] [place], [place] [id number]  RESPONSIBLE PARTY:   Tilden Domengela Campbell (grandaughter) [phone]  ASSESSMENT/RECOMMENDATIONS :  1.Chronic Left hip pain and generalized pain: patient s/p LHR 2015 with post op periprothetic fracture s/p internal fixation -patient is very hard of hearing with dementia;currently on tylenol 500mg  BID with PRN tylenol only that she has not had since may 2018. Doubt patient is able to request pain medication -Will increase tylenol to 1000mg  BID -add scheduled ultram 25mg  BID  2. Dementia with behavorial disturbance/anxiety/depression/suicidal ideations: patient with h/o delusions and paranoia; grand-daughter says patient says she wants to kill herself, tells her husband to bring her a gun and says if she could get window open she would jump; both of patient's daughter's died young due to complications from anesthesia -safety measures in place; windows do not open all the way ; and room is kept clear of sharp objects -continue Abilify 2 mg daily; followed by psychiatry -continue celexa   3. FTT/weight loss: current weight 127.2 will continue ensure supplements BID; monthly weights  4.HTN/s/p CVA/CAD:  -HR today 54 and BP 110/60- consider decreasing metoprolol to 12.5mg  BID -continue plavix  I spent 60 minutes providing this consultation,  From 11:00 to 12:00. More than 50% of the time in this consultation was spent coordinating communication.   HISTORY OF PRESENT ILLNESS:  Jessica Booth is a 84 y.o. year old female with multiple medical problems including chronic Left hip pain s/p LHR and post-op periprosthetic fx s/p internal fixation; generalized pain dementia with behavioral disturbance; delusions,depression, anxiety, suicidal ideation,gait disorder;  hearing loss; FTT/weight loss. Palliative Care was asked to help with symptom management and address goals of care.   CODE STATUS: DNR/MOST  PPS: 50% HOSPICE ELIGIBILITY/DIAGNOSIS: TBD  PAST MEDICAL HISTORY:  Past Medical History:  Diagnosis Date  . Abnormal liver function test   . Coronary artery disease 2004   stent RCA  . Hyperlipidemia   . Hypertension   . Irritable bowel syndrome   . Stroke [facility](HCC)     SOCIAL HX:  Social History   Tobacco Use  . Smoking status: Never Smoker  . Smokeless tobacco: Never Used  Substance Use Topics  . Alcohol use: Yes    Comment: beer rarely    ALLERGIES:  Allergies  Allergen Reactions  . Penicillins Hives and Rash     PERTINENT MEDICATIONS:  Outpatient Encounter Medications as of 09/25/2017  Medication Sig  . acetaminophen (TYLENOL) 500 MG tablet Take 1 tablet (500 mg total) by mouth every 6 (six) hours as needed for mild pain.  . citalopram (CELEXA) 20 MG tablet Take 20 mg by mouth daily.  . clopidogrel (PLAVIX) 75 MG tablet Take 1 tablet (75 mg total) by mouth daily.  Marland Kitchen. enoxaparin (LOVENOX) 30 MG/0.3ML injection Inject 0.3 mLs (30 mg total) into the skin daily. For 3 weeks.  . feeding supplement, ENSURE ENLIVE, (ENSURE ENLIVE) LIQD Take 237 mLs by mouth 2 (two) times daily between meals. (Patient taking differently: Take 237 mLs by mouth daily. )  . HYDROcodone-acetaminophen (NORCO/VICODIN) 5-325 MG tablet Take 1 tablet by mouth every 6 (six) hours as needed for moderate pain. (Patient not taking: Reported on 08/06/2016)  . lactobacillus acidophilus (BACID) TABS tablet Take 2 tablets by mouth daily.  Marland Kitchen. loperamide (IMODIUM)  2 MG capsule Take 2 mg by mouth every 6 (six) hours as needed for diarrhea or loose stools.  . meclizine (ANTIVERT) 12.5 MG tablet Take 1 tablet (12.5 mg total) by mouth 2 (two) times daily as needed for dizziness.  . metoprolol tartrate (LOPRESSOR) 25 MG tablet Take 25 mg by mouth 2 (two) times daily.  . Psyllium 500  MG CAPS Take 1 capsule by mouth daily.  . risperiDONE (RISPERDAL) 0.25 MG tablet Take 0.25 mg by mouth 2 (two) times daily.  Marland Kitchen senna (SENOKOT) 8.6 MG TABS tablet Take 1 tablet by mouth daily as needed for mild constipation.   No facility-administered encounter medications on file as of 09/25/2017.     PHYSICAL EXAM:   General: NAD, thin, hard of hearing Cardiovascular: regular rate and rhythm Pulmonary: clear ant fields Abdomen: soft, nontender, + bowel sounds GU: no suprapubic tenderness Extremities: 1+ bilateral pedal  edema, no joint deformities Skin: no rashes Neurological: Weakness but otherwise nonfocal  Stephanie G [place], NP

## 2017-10-01 ENCOUNTER — Non-Acute Institutional Stay: Payer: Medicare Other | Admitting: Nurse Practitioner

## 2017-10-01 DIAGNOSIS — F339 Major depressive disorder, recurrent, unspecified: Secondary | ICD-10-CM

## 2017-10-01 DIAGNOSIS — F0391 Unspecified dementia with behavioral disturbance: Secondary | ICD-10-CM

## 2017-10-01 DIAGNOSIS — R269 Unspecified abnormalities of gait and mobility: Secondary | ICD-10-CM

## 2017-10-01 DIAGNOSIS — R45851 Suicidal ideations: Secondary | ICD-10-CM

## 2017-10-01 DIAGNOSIS — F419 Anxiety disorder, unspecified: Secondary | ICD-10-CM

## 2017-10-01 DIAGNOSIS — R634 Abnormal weight loss: Secondary | ICD-10-CM

## 2017-10-01 DIAGNOSIS — G8929 Other chronic pain: Secondary | ICD-10-CM

## 2017-10-01 DIAGNOSIS — I159 Secondary hypertension, unspecified: Secondary | ICD-10-CM

## 2017-10-01 DIAGNOSIS — Z515 Encounter for palliative care: Secondary | ICD-10-CM

## 2017-10-01 NOTE — Progress Notes (Signed)
PALLIATIVE CARE CONSULT VISIT   PATIENT NAME: Jessica Booth DOB: December 11, 1922 MRN: [id number]  PRIMARY CARE PROVIDER:   Curly Rim, MD  REFERRING PROVIDER:  Curly Rim, MD [place], [place] [address]  RESPONSIBLE PARTY:   Criss Alvine (grandaughter) [phone]  Interim history: Met with grand-daughter and patient in patient room. Grand-daughter says she has seen a significant improvement in her mood since starting the abilify. Patient continues to have delusions and hallucinations. Says she feel like a bolt of lighting is going through her body and that sometimes she see the lighting. Sometimes she sees her deceased husband who touches her. Patient reports that left hip only hurts with movement but no other time. Patient feels that she sleeps to much  ASSESSMENT/RECOMMENDATIONS :   1.Chronic Left hip pain and generalized pain: patient s/p LHR 2015 with post op periprothetic fracture s/p internal fixation -patient is very hard of hearing with dementia;currently on tylenol 567m BID with PRN tylenol only that she has not had since may 2018. Doubt patient is able to request pain medication -Will increase tylenol to 10024mBID -will discontinue tramadol since hip hurts only with movement; does not hurt while sitting  2. Dementia with behavorial disturbance/anxiety/depression/suicidal ideations: patient with h/o delusions and paranoia; grand-daughter says patient says she wants to kill herself, tells her husband to bring her a gun and says if she could get window open she would jump; both of patient's daughter's died young due to complications from anesthesia -safety measures in place; windows do not open all the way ; and room is kept clear of sharp objects -continue Abilify 2 mg daily; followed by psychiatry -continue celexa   3. FTT/weight loss: current weight 127.2 will continue ensure supplements BID; monthly weights  4.HTN/s/p CVA/CAD:  -HR today  54 and BP 110/60- consider decreasing metoprolol to 12.54m69mID -continue plavix  I spent 60 minutes providing this consultation,  From 11:00 to 12:00. More than 50% of the time in this consultation was spent coordinating communication.   HISTORY OF PRESENT ILLNESS:  Jessica Booth a 89 39o. year old female with multiple medical problems including chronic Left hip pain s/p LHR and post-op periprosthetic fx s/p internal fixation; generalized pain dementia with behavioral disturbance; delusions,depression, anxiety, suicidal ideation,gait disorder; hearing loss; FTT/weight loss. Palliative Care was asked to help with symptom management and address goals of care.   CODE STATUS: DNR/MOST  PPS: 50% HOSPICE ELIGIBILITY/DIAGNOSIS: TBD  PAST MEDICAL HISTORY:  Past Medical History:  Diagnosis Date  . Abnormal liver function test   . Coronary artery disease 2004   stent RCA  . Hyperlipidemia   . Hypertension   . Irritable bowel syndrome   . [facility]   SOCIAL HX:  Social History   Tobacco Use  . Smoking status: Never Smoker  . Smokeless tobacco: Never Used  Substance Use Topics  . Alcohol use: Yes    Comment: beer rarely    ALLERGIES:  Allergies  Allergen Reactions  . Penicillins Hives and Rash     PERTINENT MEDICATIONS:  Outpatient Encounter Medications as of 10/01/2017  Medication Sig  . acetaminophen (TYLENOL) 500 MG tablet Take 1 tablet (500 mg total) by mouth every 6 (six) hours as needed for mild pain.  . citalopram (CELEXA) 20 MG tablet Take 20 mg by mouth daily.  . clopidogrel (PLAVIX) 75 MG tablet Take 1 tablet (75 mg total) by mouth daily.  .Marland Kitchen  enoxaparin (LOVENOX) 30 MG/0.3ML injection Inject 0.3 mLs (30 mg total) into the skin daily. For 3 weeks.  . feeding supplement, ENSURE ENLIVE, (ENSURE ENLIVE) LIQD Take 237 mLs by mouth 2 (two) times daily between meals.  Marland Kitchen HYDROcodone-acetaminophen (NORCO/VICODIN) 5-325 MG tablet Take 1 tablet by mouth every 6 (six) hours  as needed for moderate pain. (Patient not taking: Reported on 08/06/2016)  . lactobacillus acidophilus (BACID) TABS tablet Take 2 tablets by mouth daily.  Marland Kitchen loperamide (IMODIUM) 2 MG capsule Take 2 mg by mouth every 6 (six) hours as needed for diarrhea or loose stools.  . meclizine (ANTIVERT) 12.5 MG tablet Take 1 tablet (12.5 mg total) by mouth 2 (two) times daily as needed for dizziness. (Patient not taking: Reported on 09/25/2017)  . metoprolol tartrate (LOPRESSOR) 25 MG tablet Take 25 mg by mouth 2 (two) times daily.  . Psyllium 500 MG CAPS Take 1 capsule by mouth daily.  . risperiDONE (RISPERDAL) 0.25 MG tablet Take 0.25 mg by mouth 2 (two) times daily.  Marland Kitchen senna (SENOKOT) 8.6 MG TABS tablet Take 1 tablet by mouth daily as needed for mild constipation.   No facility-administered encounter medications on file as of 10/01/2017.     PHYSICAL EXAM:   General: NAD, thin, hard of hearing Cardiovascular: regular rate and rhythm Pulmonary: clear ant fields Abdomen: soft, nontender, + bowel sounds GU: no suprapubic tenderness Extremities: 1+ bilateral pedal  edema, no joint deformities Skin: no rashes Neurological: Weakness but otherwise nonfocal  Stephanie G [place], NP

## 2017-10-13 ENCOUNTER — Emergency Department (HOSPITAL_COMMUNITY): Payer: Medicare Other

## 2017-10-13 ENCOUNTER — Encounter (HOSPITAL_COMMUNITY): Payer: Self-pay | Admitting: Neurology

## 2017-10-13 ENCOUNTER — Inpatient Hospital Stay (HOSPITAL_COMMUNITY): Payer: Medicare Other

## 2017-10-13 ENCOUNTER — Inpatient Hospital Stay (HOSPITAL_COMMUNITY)
Admission: EM | Admit: 2017-10-13 | Discharge: 2017-10-17 | DRG: 092 | Disposition: A | Discharge status: Hospice | Payer: Medicare Other | Attending: Internal Medicine | Admitting: Internal Medicine

## 2017-10-13 DIAGNOSIS — W19XXXA Unspecified fall, initial encounter: Secondary | ICD-10-CM | POA: Diagnosis present

## 2017-10-13 DIAGNOSIS — I5030 Unspecified diastolic (congestive) heart failure: Secondary | ICD-10-CM | POA: Diagnosis not present

## 2017-10-13 DIAGNOSIS — R627 Adult failure to thrive: Secondary | ICD-10-CM

## 2017-10-13 DIAGNOSIS — R471 Dysarthria and anarthria: Secondary | ICD-10-CM | POA: Diagnosis present

## 2017-10-13 DIAGNOSIS — Z7901 Long term (current) use of anticoagulants: Secondary | ICD-10-CM | POA: Diagnosis not present

## 2017-10-13 DIAGNOSIS — G92 Toxic encephalopathy: Secondary | ICD-10-CM | POA: Diagnosis present

## 2017-10-13 DIAGNOSIS — Z79899 Other long term (current) drug therapy: Secondary | ICD-10-CM | POA: Diagnosis not present

## 2017-10-13 DIAGNOSIS — G2119 Other drug induced secondary parkinsonism: Secondary | ICD-10-CM | POA: Diagnosis not present

## 2017-10-13 DIAGNOSIS — K589 Irritable bowel syndrome without diarrhea: Secondary | ICD-10-CM | POA: Diagnosis present

## 2017-10-13 DIAGNOSIS — F0281 Dementia in other diseases classified elsewhere with behavioral disturbance: Secondary | ICD-10-CM | POA: Diagnosis present

## 2017-10-13 DIAGNOSIS — F329 Major depressive disorder, single episode, unspecified: Secondary | ICD-10-CM | POA: Diagnosis present

## 2017-10-13 DIAGNOSIS — R4182 Altered mental status, unspecified: Secondary | ICD-10-CM | POA: Diagnosis present

## 2017-10-13 DIAGNOSIS — E785 Hyperlipidemia, unspecified: Secondary | ICD-10-CM | POA: Diagnosis present

## 2017-10-13 DIAGNOSIS — G9341 Metabolic encephalopathy: Secondary | ICD-10-CM | POA: Diagnosis not present

## 2017-10-13 DIAGNOSIS — R748 Abnormal levels of other serum enzymes: Secondary | ICD-10-CM | POA: Diagnosis present

## 2017-10-13 DIAGNOSIS — Z515 Encounter for palliative care: Secondary | ICD-10-CM

## 2017-10-13 DIAGNOSIS — M6282 Rhabdomyolysis: Secondary | ICD-10-CM | POA: Diagnosis present

## 2017-10-13 DIAGNOSIS — G709 Myoneural disorder, unspecified: Secondary | ICD-10-CM | POA: Diagnosis present

## 2017-10-13 DIAGNOSIS — I7389 Other specified peripheral vascular diseases: Secondary | ICD-10-CM | POA: Diagnosis present

## 2017-10-13 DIAGNOSIS — R29898 Other symptoms and signs involving the musculoskeletal system: Secondary | ICD-10-CM | POA: Diagnosis not present

## 2017-10-13 DIAGNOSIS — I10 Essential (primary) hypertension: Secondary | ICD-10-CM | POA: Diagnosis not present

## 2017-10-13 DIAGNOSIS — R52 Pain, unspecified: Secondary | ICD-10-CM

## 2017-10-13 DIAGNOSIS — Z8249 Family history of ischemic heart disease and other diseases of the circulatory system: Secondary | ICD-10-CM

## 2017-10-13 DIAGNOSIS — G301 Alzheimer's disease with late onset: Secondary | ICD-10-CM | POA: Diagnosis not present

## 2017-10-13 DIAGNOSIS — Z96642 Presence of left artificial hip joint: Secondary | ICD-10-CM | POA: Diagnosis present

## 2017-10-13 DIAGNOSIS — Z7902 Long term (current) use of antithrombotics/antiplatelets: Secondary | ICD-10-CM | POA: Diagnosis not present

## 2017-10-13 DIAGNOSIS — F0391 Unspecified dementia with behavioral disturbance: Secondary | ICD-10-CM

## 2017-10-13 DIAGNOSIS — Z66 Do not resuscitate: Secondary | ICD-10-CM

## 2017-10-13 DIAGNOSIS — I2510 Atherosclerotic heart disease of native coronary artery without angina pectoris: Secondary | ICD-10-CM | POA: Diagnosis present

## 2017-10-13 DIAGNOSIS — Z880 Allergy status to penicillin: Secondary | ICD-10-CM

## 2017-10-13 DIAGNOSIS — F03918 Unspecified dementia, unspecified severity, with other behavioral disturbance: Secondary | ICD-10-CM

## 2017-10-13 DIAGNOSIS — T4395XA Adverse effect of unspecified psychotropic drug, initial encounter: Secondary | ICD-10-CM | POA: Diagnosis present

## 2017-10-13 DIAGNOSIS — Y92099 Unspecified place in other non-institutional residence as the place of occurrence of the external cause: Secondary | ICD-10-CM

## 2017-10-13 DIAGNOSIS — Z8673 Personal history of transient ischemic attack (TIA), and cerebral infarction without residual deficits: Secondary | ICD-10-CM | POA: Diagnosis not present

## 2017-10-13 DIAGNOSIS — Z8659 Personal history of other mental and behavioral disorders: Secondary | ICD-10-CM | POA: Diagnosis not present

## 2017-10-13 LAB — URINALYSIS, ROUTINE W REFLEX MICROSCOPIC
BILIRUBIN URINE: NEGATIVE
Glucose, UA: NEGATIVE mg/dL
HGB URINE DIPSTICK: NEGATIVE
Ketones, ur: NEGATIVE mg/dL
Leukocytes, UA: NEGATIVE
NITRITE: NEGATIVE
PROTEIN: 30 mg/dL — AB
Specific Gravity, Urine: 1.019 (ref 1.005–1.030)
pH: 5 (ref 5.0–8.0)

## 2017-10-13 LAB — COMPREHENSIVE METABOLIC PANEL
ALT: 30 U/L (ref 0–44)
AST: 48 U/L — AB (ref 15–41)
Albumin: 4.3 g/dL (ref 3.5–5.0)
Alkaline Phosphatase: 45 U/L (ref 38–126)
Anion gap: 10 (ref 5–15)
BUN: 27 mg/dL — AB (ref 8–23)
CO2: 23 mmol/L (ref 22–32)
Calcium: 9.5 mg/dL (ref 8.9–10.3)
Chloride: 105 mmol/L (ref 98–111)
Creatinine, Ser: 1.23 mg/dL — ABNORMAL HIGH (ref 0.44–1.00)
GFR calc Af Amer: 42 mL/min — ABNORMAL LOW (ref >60)
GFR, EST NON AFRICAN AMERICAN: 37 mL/min — AB (ref >60)
Glucose, Bld: 104 mg/dL — ABNORMAL HIGH (ref 70–99)
POTASSIUM: 4.6 mmol/L (ref 3.5–5.1)
SODIUM: 138 mmol/L (ref 135–145)
Total Bilirubin: 1.5 mg/dL — ABNORMAL HIGH (ref 0.3–1.2)
Total Protein: 7.1 g/dL (ref 6.5–8.1)

## 2017-10-13 LAB — CBC WITH DIFFERENTIAL/PLATELET
Abs Immature Granulocytes: 0.1 10*3/uL (ref 0.0–0.1)
Basophils Absolute: 0 10*3/uL (ref 0.0–0.1)
Basophils Relative: 0 %
EOS PCT: 0 %
Eosinophils Absolute: 0 10*3/uL (ref 0.0–0.7)
HEMATOCRIT: 39.1 % (ref 36.0–46.0)
HEMOGLOBIN: 13 g/dL (ref 12.0–15.0)
IMMATURE GRANULOCYTES: 0 %
LYMPHS ABS: 2.5 10*3/uL (ref 0.7–4.0)
Lymphocytes Relative: 22 %
MCH: 33.2 pg (ref 26.0–34.0)
MCHC: 33.2 g/dL (ref 30.0–36.0)
MCV: 99.7 fL (ref 78.0–100.0)
Monocytes Absolute: 1.1 10*3/uL — ABNORMAL HIGH (ref 0.1–1.0)
Monocytes Relative: 10 %
NEUTROS PCT: 68 %
Neutro Abs: 7.8 10*3/uL — ABNORMAL HIGH (ref 1.7–7.7)
Platelets: 238 10*3/uL (ref 150–400)
RBC: 3.92 MIL/uL (ref 3.87–5.11)
RDW: 11.9 % (ref 11.5–15.5)
WBC: 11.5 10*3/uL — AB (ref 4.0–10.5)

## 2017-10-13 LAB — I-STAT VENOUS BLOOD GAS, ED
Acid-Base Excess: 2 mmol/L (ref 0.0–2.0)
Bicarbonate: 26.1 mmol/L (ref 20.0–28.0)
O2 Saturation: 60 %
PCO2 VEN: 38.2 mmHg — AB (ref 44.0–60.0)
PH VEN: 7.443 — AB (ref 7.250–7.430)
TCO2: 27 mmol/L (ref 22–32)
pO2, Ven: 30 mmHg — CL (ref 32.0–45.0)

## 2017-10-13 LAB — TSH: TSH: 3.189 u[IU]/mL (ref 0.350–4.500)

## 2017-10-13 LAB — AMMONIA: AMMONIA: 25 umol/L (ref 9–35)

## 2017-10-13 LAB — T4, FREE: Free T4: 0.88 ng/dL (ref 0.82–1.77)

## 2017-10-13 LAB — CK: Total CK: 501 U/L — ABNORMAL HIGH (ref 38–234)

## 2017-10-13 LAB — I-STAT CG4 LACTIC ACID, ED: Lactic Acid, Venous: 1.61 mmol/L (ref 0.5–1.9)

## 2017-10-13 MED ORDER — LORAZEPAM 2 MG/ML IJ SOLN
1.0000 mg | Freq: Once | INTRAMUSCULAR | Status: AC
  Administered 2017-10-13: 1 mg via INTRAVENOUS
  Filled 2017-10-13: qty 1

## 2017-10-13 MED ORDER — ACETAMINOPHEN 650 MG RE SUPP: 650.0000 mg | Freq: Four times a day (QID) | RECTAL | Status: DC | PRN

## 2017-10-13 MED ORDER — LACTATED RINGERS IV SOLN
INTRAVENOUS | Status: DC
  Administered 2017-10-13: 20:00:00 via INTRAVENOUS

## 2017-10-13 MED ORDER — METOPROLOL TARTRATE 25 MG PO TABS: 25.0000 mg | ORAL_TABLET | Freq: Two times a day (BID) | ORAL | Status: DC

## 2017-10-13 MED ORDER — CLOPIDOGREL BISULFATE 75 MG PO TABS
75.0000 mg | ORAL_TABLET | Freq: Every day | ORAL | Status: DC
  Administered 2017-10-14: 75 mg via NASOGASTRIC
  Filled 2017-10-13: qty 1

## 2017-10-13 MED ORDER — ADULT MULTIVITAMIN W/MINERALS CH
1.0000 | ORAL_TABLET | Freq: Every day | ORAL | Status: DC
  Administered 2017-10-14: 1
  Filled 2017-10-13: qty 1

## 2017-10-13 MED ORDER — CLOPIDOGREL BISULFATE 75 MG PO TABS: 75.0000 mg | ORAL_TABLET | Freq: Every day | ORAL | Status: DC

## 2017-10-13 MED ORDER — ACETAMINOPHEN 325 MG PO TABS: 650.0000 mg | ORAL_TABLET | Freq: Four times a day (QID) | ORAL | Status: DC | PRN

## 2017-10-13 MED ORDER — PSYLLIUM 500 MG PO CAPS: 500.0000 mg | ORAL_CAPSULE | Freq: Every day | ORAL | Status: DC

## 2017-10-13 MED ORDER — LORAZEPAM 2 MG/ML IJ SOLN
2.0000 mg | INTRAMUSCULAR | Status: DC | PRN
  Administered 2017-10-14 – 2017-10-15 (×2): 2 mg via INTRAVENOUS
  Filled 2017-10-13 (×2): qty 1

## 2017-10-13 MED ORDER — ENOXAPARIN SODIUM 40 MG/0.4ML ~~LOC~~ SOLN
40.0000 mg | SUBCUTANEOUS | Status: DC
  Administered 2017-10-13: 40 mg via SUBCUTANEOUS
  Filled 2017-10-13: qty 0.4

## 2017-10-13 MED ORDER — ONDANSETRON HCL 4 MG PO TABS: 4.0000 mg | ORAL_TABLET | Freq: Four times a day (QID) | ORAL | Status: DC | PRN

## 2017-10-13 MED ORDER — FERRALET 90 90-1 MG PO TABS: 1.0000 | ORAL_TABLET | Freq: Every day | ORAL | Status: DC

## 2017-10-13 MED ORDER — ONDANSETRON HCL 4 MG/2ML IJ SOLN: 4.0000 mg | Freq: Four times a day (QID) | INTRAMUSCULAR | Status: DC | PRN

## 2017-10-13 MED ORDER — ENSURE ENLIVE PO LIQD: 237.0000 mL | Freq: Three times a day (TID) | ORAL | Status: DC

## 2017-10-13 MED ORDER — SODIUM CHLORIDE 0.9 % IV BOLUS
1000.0000 mL | Freq: Once | INTRAVENOUS | Status: AC
  Administered 2017-10-13: 1000 mL via INTRAVENOUS

## 2017-10-13 MED ORDER — METOPROLOL TARTRATE 25 MG PO TABS
25.0000 mg | ORAL_TABLET | Freq: Two times a day (BID) | ORAL | Status: DC
  Administered 2017-10-13 – 2017-10-14 (×2): 25 mg
  Filled 2017-10-13 (×2): qty 1

## 2017-10-13 MED ORDER — PSYLLIUM 95 % PO PACK
1.0000 | PACK | Freq: Every day | ORAL | Status: DC
  Administered 2017-10-13: 1
  Filled 2017-10-13 (×2): qty 1

## 2017-10-13 MED ORDER — ENSURE ENLIVE PO LIQD
237.0000 mL | Freq: Three times a day (TID) | ORAL | Status: DC
  Administered 2017-10-13 – 2017-10-14 (×2): 237 mL

## 2017-10-13 MED ORDER — BENZTROPINE MESYLATE 1 MG PO TABS
1.0000 mg | ORAL_TABLET | Freq: Two times a day (BID) | ORAL | Status: DC
  Administered 2017-10-13 – 2017-10-14 (×3): 1 mg via ORAL
  Filled 2017-10-13: qty 2
  Filled 2017-10-13 (×3): qty 1
  Filled 2017-10-13: qty 2
  Filled 2017-10-13: qty 1
  Filled 2017-10-13: qty 2

## 2017-10-13 NOTE — H&P (Addendum)
History and Physical    DOA: 10/13/2017  PCP: Corrington, Kip A, MD  Patient coming from: Assisted living facility [facility])  Chief Complaint: Found down by ALF staff  HPI: Jessica Booth is a 84 y.o. 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  Patient is altered and nonverbal currently.  Most of the history obtained by talking to granddaughter over the phone.  Per granddaughter, patient is pretty functional in terms of ADLs at baseline.  She is able to dress herself, ambulate independently, feed herself etc. She appeared to be at her baseline when last visited by granddaughter on Wednesday.  Today staff apparently found her on the floor laying on her back.  The refrigerator door was open and milk was spilled all over the floor.  Patient apparently did not have her pants on and she wore her shirt backwards.  According to granddaughter this is very unusual to her and makes her believe that she probably was confused before the fall.  Patient does have depression and dementia with periodic visual hallucinations where she would mention seeing people and bugs crawling.  She is followed by doctors making house calls for psychiatric care and her medications have been adjusted on several occasions over the last 6 months.  She was started on Abilify in August and most recently on Wellbutrin (on October 1) and her Lexapro dose was also increased from 15 mg to 20 mg in the October visit.  Patient in the ED is nonverbal, noted to be holding her eyes tight, has rigid extremities and not responding to verbal or tactile stimuli.  Patient has been evaluated by neurology and felt to have medication related neuromuscular rigidity.  She is requested to be admitted for further evaluation and management.   Review of  Systems: As per HPI otherwise 10 point review of systems negative.    Past Medical History:  Diagnosis Date  . Abnormal liver function test   . Coronary artery disease 2004   stent RCA  . Hyperlipidemia   . Hypertension   . Irritable bowel syndrome   . Stroke [facility])     Past Surgical History:  Procedure Laterality Date  . FEMUR FRACTURE SURGERY    . HIP ARTHROPLASTY Left 03/27/2016   Procedure: ARTHROPLASTY BIPOLAR HIP (HEMIARTHROPLASTY);  Surgeon: Nadara Mustard, MD;  Location: [facility] OR;  Service: Orthopedics;  Laterality: Left;  . ORIF PERIPROSTHETIC FRACTURE Left 06/05/2016   Procedure: OPEN REDUCTION INTERNAL FIXATION (ORIF) LEFT PERIPROSTHETIC FEMUR FRACTURE;  Surgeon: Nadara Mustard, MD;  Location: [facility] OR;  Service: Orthopedics;  Laterality: Left;    Social history:  reports that she has never smoked. She has never used smokeless tobacco. She reports that she drinks alcohol. Her drug history is not on file.   Allergies  Allergen Reactions  . Penicillins Hives and Rash    Has patient had a PCN reaction causing immediate rash, facial/tongue/throat swelling, SOB or lightheadedness with hypotension: Unknown Has patient had a PCN reaction causing severe rash involving mucus membranes or skin necrosis: Unknown Has patient had a PCN reaction that required hospitalization: Unknown Has patient had a PCN reaction occurring within the last 10 years: Unknown If all of the above answers are "NO", then may proceed with Cephalosporin use.    Listed on [facility]    Family History  Problem Relation Age of Onset  . Hypertension Sister       Prior to Admission medications   Medication Sig Start Date End Date Taking? Authorizing Provider  acetaminophen (TYLENOL) 500 MG tablet Take 1 tablet (500 mg total) by mouth every 6 (six) hours as needed for mild pain. 06/06/16  Yes Nadara Mustard, MD  acetaminophen (TYLENOL) 500 MG tablet Take 1,000 mg by mouth 2 (two) times daily.   Yes [provider]  ARIPiprazole (ABILIFY) 2 MG tablet Take 2 mg by mouth daily.   Yes [provider]  buPROPion (WELLBUTRIN XL) 150 MG 24 hr tablet Take 150 mg by mouth every morning.   Yes [provider]  cholestyramine Lanetta Inch) 4 g packet Take 4 g by mouth every Monday, Wednesday, and Friday. Give 1 packet by mouth at bedtime every mon, wed, fri for diarrhea mix with 6oz of fluid   Yes [provider]  clopidogrel (PLAVIX) 75 MG tablet Take 1 tablet (75 mg total) by mouth daily. 03/30/16  Yes Osvaldo Shipper, MD  escitalopram (LEXAPRO) 20 MG tablet Take 20 mg by mouth daily.   Yes [provider]  Fe Cbn-Fe Gluc-FA-B12-C-DSS (FERRALET 90) 90-1 MG TABS Take 1 tablet by mouth daily.   Yes [provider]  feeding supplement, ENSURE ENLIVE, (ENSURE ENLIVE) LIQD Take 237 mLs by mouth 2 (two) times daily between meals. Patient taking differently: Take 237 mLs by mouth daily.  03/30/16  Yes Osvaldo Shipper, MD  levocetirizine (XYZAL) 5 MG tablet Take 5 mg by mouth at bedtime.   Yes [provider]  Menthol, Topical Analgesic, (BIOFREEZE) 4 % GEL Apply 1 application topically every 8 (eight) hours as needed (under left breast as needed for pain).   Yes [provider]  metoprolol tartrate (LOPRESSOR) 25 MG tablet Take 25 mg by mouth 2 (two) times daily.   Yes [provider]  multivitamin-iron-minerals-folic acid (THERAPEUTIC-M) TABS tablet Take 1 tablet by mouth daily.   Yes [provider]  Psyllium 500 MG CAPS Take 500 mg by mouth at bedtime.    Yes [provider]  traMADol (ULTRAM) 50 MG tablet Take 50 mg by mouth every 6 (six) hours as needed for pain. 02/08/14  Yes [provider]  enoxaparin (LOVENOX) 30 MG/0.3ML injection Inject 0.3 mLs (30 mg total) into the skin daily. For 3 weeks. 06/09/16 06/23/16  Zannie Cove, MD  HYDROcodone-acetaminophen (NORCO/VICODIN) 5-325 MG tablet Take 1 tablet by mouth  every 6 (six) hours as needed for moderate pain. Patient not taking: Reported on 08/06/2016 06/09/16   Zannie Cove, MD  meclizine (ANTIVERT) 12.5 MG tablet Take 1 tablet (12.5 mg total) by mouth 2 (two) times daily as needed for dizziness. Patient not taking: Reported on 09/25/2017 03/30/16   Osvaldo Shipper, MD    Physical Exam: Vitals:   10/13/17 1541 10/13/17 1600 10/13/17 1630 10/13/17 1700  BP: 114/69 (!) 149/76 (!) 156/79 (!) 176/85  Pulse: 79 84 81 86  Resp: 19 (!) 22 18 16   Temp:      TempSrc:      SpO2: 94% 96% 96% 96%    Constitutional: NAD, calm, comfortable Vitals:   10/13/17 1541 10/13/17 1600 10/13/17 1630 10/13/17 1700  BP: 114/69 (!) 149/76 (!) 156/79 (!) 176/85  Pulse: 79 84 81 86  Resp: 19 (!) 22 18 16   Temp:      TempSrc:      SpO2: 94% 96% 96% 96%   Eyes:  PERRL, limited exam as patient noncooperative but conjunctivae appears normal ENMT: Could not examine as patient not cooperative Neck: She is laying in the ED stretcher with head and neck turned to her left side and holds rigidly when tried to move Respiratory: clear to auscultation bilaterally, no wheezing, no crackles. Normal respiratory effort. No accessory muscle use.  Cardiovascular: Regular rate and rhythm, no murmurs / rubs / gallops. No extremity edema. 2+ pedal pulses. No carotid bruits.  Abdomen: no tenderness, no masses palpated. No hepatosplenomegaly. Bowel sounds positive.   Neurologic: Very limited exam as patient not cooperative.  No facial droop noted.  No flaccidity noted.  She appears to be rigid in all extremities left UE greater than right Babinski downgoing.  Reflexes 2/4. Psychiatric: Cannot assess SKIN/catheters: no rashes, lesions, ulcers. No induration  Labs on Admission: I have personally reviewed following labs and imaging studies  CBC: Recent Labs  Lab 10/13/17 1105  WBC 11.5*  NEUTROABS 7.8*  HGB 13.0  HCT 39.1  MCV 99.7  PLT 238   Basic Metabolic Panel: Recent Labs    Lab 10/13/17 1105  NA 138  K 4.6  CL 105  CO2 23  GLUCOSE 104*  BUN 27*  CREATININE 1.23*  CALCIUM 9.5   GFR: CrCl cannot be calculated (Unknown ideal weight.). Liver Function Tests: Recent Labs  Lab 10/13/17 1105  AST 48*  ALT 30  ALKPHOS 45  BILITOT 1.5*  PROT 7.1  ALBUMIN 4.3   No results for input(s): LIPASE, AMYLASE in the last 168 hours. Recent Labs  Lab 10/13/17 1434  AMMONIA 25   Coagulation Profile: No results for input(s): INR, PROTIME in the last 168 hours. Cardiac Enzymes: Recent Labs  Lab 10/13/17 1105  CKTOTAL 501*   BNP (last 3 results) No results for input(s): PROBNP in the last 8760 hours. HbA1C: No results for input(s): HGBA1C in the last 72 hours. CBG: No results for input(s): GLUCAP in the last 168 hours. Lipid Profile: No results for input(s): CHOL, HDL, LDLCALC, TRIG, CHOLHDL, LDLDIRECT in the last 72 hours. Thyroid Function Tests: Recent Labs    10/13/17 1434  TSH 3.189  FREET4 0.88   Anemia Panel: No results for input(s): VITAMINB12, FOLATE, FERRITIN, TIBC, IRON, RETICCTPCT in the last 72 hours. Urine analysis:    Component Value Date/Time   COLORURINE YELLOW 10/13/2017 1324   APPEARANCEUR CLEAR 10/13/2017 1324   LABSPEC 1.019 10/13/2017 1324   PHURINE 5.0 10/13/2017 1324   GLUCOSEU NEGATIVE 10/13/2017 1324   HGBUR NEGATIVE 10/13/2017 1324   BILIRUBINUR NEGATIVE 10/13/2017 1324   KETONESUR NEGATIVE 10/13/2017 1324   PROTEINUR 30 (A) 10/13/2017 1324   UROBILINOGEN 0.2 03/21/2014 2257   NITRITE NEGATIVE 10/13/2017 1324   LEUKOCYTESUR NEGATIVE 10/13/2017 1324    Radiological Exams on Admission: Dg Chest 2 View  Result Date: 10/13/2017 CLINICAL DATA:  Altered mental status. EXAM: CHEST - 2 VIEW COMPARISON:  Chest x-ray dated Jun 03, 2016. FINDINGS: The heart size and mediastinal contours are within normal limits. Normal pulmonary vascularity. Atherosclerotic calcification of the aortic arch. No focal consolidation,  pleural effusion, or pneumothorax. Unchanged elevation of the right hemidiaphragm. No acute osseous abnormality. IMPRESSION: No active cardiopulmonary disease. Electronically Signed   By: Obie Dredge M.D.   On: 10/13/2017 12:13   Ct Head Wo Contrast  Result Date: 10/13/2017 CLINICAL DATA:  84 year old female post fall. On blood thinner. Does not remember fall. Neck pain. Initial encounter. EXAM: CT HEAD WITHOUT CONTRAST CT CERVICAL SPINE WITHOUT CONTRAST  TECHNIQUE: Multidetector CT imaging of the head and cervical spine was performed following the standard protocol without intravenous contrast. Multiplanar CT image reconstructions of the cervical spine were also generated. COMPARISON:  06/03/2016 CT. FINDINGS: CT HEAD FINDINGS Brain: No intracranial hemorrhage or CT evidence of large acute infarct. Prominent chronic microvascular changes. Global atrophy. No intracranial mass lesion noted on this unenhanced exam. Vascular: Vascular calcifications.  No hyperdense vessel Skull: No skull fracture Sinuses/Orbits: Post lens replacement. No acute orbital abnormality. Minimal polypoid opacification right maxillary sinus and minimal mucosal thickening left maxillary sinus. Minimal partial opacification inferior left mastoid air cells. No obstructing lesion of eustachian tube noted. Other: Soft tissue swelling/hematoma occipital region greater to the left of midline. CT CERVICAL SPINE FINDINGS Alignment: 3 mm anterior slip C3 new from prior examination. This probably is related to facet degenerative changes which have progressed on the left when compared to prior exam. Mild curvature cervical spine convex right. Skull base and vertebrae: No cervical spine fracture. Soft tissues and spinal canal: No abnormal prevertebral soft tissue swelling. Disc levels: Cervical spondylotic changes with spinal stenosis and cord flattening C3-4 through C5-6. Bilateral foraminal narrowing at these levels. Upper chest: Scarring lung  apices greater on right. Other: Bilateral carotid bifurcation calcifications. IMPRESSION: CT HEAD 1. Soft tissue swelling/hematoma occipital region without underlying fracture or intracranial hemorrhage. 2. Prominent chronic microvascular changes. Global atrophy. 3. Minimal polypoid opacification right maxillary sinus and minimal mucosal thickening left maxillary sinus. Minimal partial opacification inferior left mastoid air cells. No obstructing lesion of eustachian tube noted. CT CERVICAL SPINE 1. 3 mm anterior slip C3 new from prior examination. This probably is related to facet degenerative changes which have progressed on the left when compared to prior exam. 2. No cervical spine fracture or abnormal prevertebral soft tissue swelling. 3. Cervical spondylotic changes with spinal stenosis and cord flattening C3-4 through C5-6. Bilateral foraminal narrowing at these levels. Electronically Signed   By: Lacy Duverney M.D.   On: 10/13/2017 10:13   Ct Cervical Spine Wo Contrast  Result Date: 10/13/2017 CLINICAL DATA:  84 year old female post fall. On blood thinner. Does not remember fall. Neck pain. Initial encounter. EXAM: CT HEAD WITHOUT CONTRAST CT CERVICAL SPINE WITHOUT CONTRAST TECHNIQUE: Multidetector CT imaging of the head and cervical spine was performed following the standard protocol without intravenous contrast. Multiplanar CT image reconstructions of the cervical spine were also generated. COMPARISON:  06/03/2016 CT. FINDINGS: CT HEAD FINDINGS Brain: No intracranial hemorrhage or CT evidence of large acute infarct. Prominent chronic microvascular changes. Global atrophy. No intracranial mass lesion noted on this unenhanced exam. Vascular: Vascular calcifications.  No hyperdense vessel Skull: No skull fracture Sinuses/Orbits: Post lens replacement. No acute orbital abnormality. Minimal polypoid opacification right maxillary sinus and minimal mucosal thickening left maxillary sinus. Minimal partial  opacification inferior left mastoid air cells. No obstructing lesion of eustachian tube noted. Other: Soft tissue swelling/hematoma occipital region greater to the left of midline. CT CERVICAL SPINE FINDINGS Alignment: 3 mm anterior slip C3 new from prior examination. This probably is related to facet degenerative changes which have progressed on the left when compared to prior exam. Mild curvature cervical spine convex right. Skull base and vertebrae: No cervical spine fracture. Soft tissues and spinal canal: No abnormal prevertebral soft tissue swelling. Disc levels: Cervical spondylotic changes with spinal stenosis and cord flattening C3-4 through C5-6. Bilateral foraminal narrowing at these levels. Upper chest: Scarring lung apices greater on right. Other: Bilateral carotid bifurcation calcifications. IMPRESSION: CT HEAD 1.  Soft tissue swelling/hematoma occipital region without underlying fracture or intracranial hemorrhage. 2. Prominent chronic microvascular changes. Global atrophy. 3. Minimal polypoid opacification right maxillary sinus and minimal mucosal thickening left maxillary sinus. Minimal partial opacification inferior left mastoid air cells. No obstructing lesion of eustachian tube noted. CT CERVICAL SPINE 1. 3 mm anterior slip C3 new from prior examination. This probably is related to facet degenerative changes which have progressed on the left when compared to prior exam. 2. No cervical spine fracture or abnormal prevertebral soft tissue swelling. 3. Cervical spondylotic changes with spinal stenosis and cord flattening C3-4 through C5-6. Bilateral foraminal narrowing at these levels. Electronically Signed   By: Lacy Duverney M.D.   On: 10/13/2017 10:13    EKG: Independently reviewed.  Low voltage throughout, normal sinus rhythm     Assessment and Plan:   1.  Acute encephalopathy with neuromuscular rigidity: CK mildly elevated at 500.  Differentials include seizure (could be Wellbutrin  induced, also on tramadol) versus stroke versus medication induced parkinsonism.  Patient seen by neurology and per ED physician recommended benzodiazepines as needed for drug-induced parkinsonism.  Per granddaughter, patient not being on Prozac lately.  No evidence of fever or hypotension or tachycardia to suggest serotonin syndrome.  IV fluids and hold psychiatric medications for now.  Will have IV benzodiazepines available for as needed use.  EEG ordered by neurology.  Given nonverbal state, will place NG tube for meds.  Can consider Cogentin via NG tube.  2.  History of CVA: Has left upper extremity weakness at baseline.  CT head negative.  Will obtain MRI of head to rule out new stroke.  Resume Plavix and statins for now.  Neurochecks.  Will obtain echo given abnormal EKG, last echo in March showed diastolic dysfunction  3.  Hypertension/hyperlipidemia: Resume prior medications via NG tube  4.  Depression/dementia with history of hallucinations: Hold psychiatric medications and concern for problem #1.  Consult psychiatry.  DVT prophylaxis: Lovenox  Code Status: DNR as confirmed by granddaughter  Family Communication: Discussed with patient. Health care proxy is granddaughter  consults called: Neurology, psychiatry Admission status:  Patient admitted as inpatient as anticipated LOS greater than 2 midnights    Alessandra Bevels MD [facility] Pager [phone]  If 7PM-7AM, please contact night-coverage [email or web address] Password [email or web address]  10/13/2017, 5:21 PM   Addendum: NG tube placed after discussing with daughter earlier.  Informed by nurse that daughter changed her mind and wanted no feeding tubes.  I called granddaughter again Jessica Booth who can be reached at [phone]) and explained that NG tube has been placed for temporary measures/oral meds.  Granddaughter agreed to keep the plan as is for tonight, if patient does not improve by tomorrow she will consider palliative care/comfort  care options.  NG tube not to be replaced if patient pulls it out.  Palliative care consult placed.

## 2017-10-13 NOTE — Procedures (Addendum)
ELECTROENCEPHALOGRAM REPORT   Patient: Jessica Booth       Room #: [facility] EEG No. ID: [id number] Age: 84 y.o.        Sex: female Referring Physician: Lajuana Ripple Report Date:  10/13/2017        Interpreting Physician: Thana Farr  History: [name] is an 84 y.o. female with an unwitnessed fall  Medications:  Cogentin, Ferralet, Lopressor, Psyllium  Conditions of Recording:  This is a 21 channel routine scalp EEG performed with bipolar and monopolar montages arranged in accordance to the international 10/20 system of electrode placement. One channel was dedicated to EKG recording.  The patient is in the drowsy and asleep states.  Description:  The patient is asleep for the majority of the recording.  The background is slow and poorly organized consisting mostly of a polymorphic delta rhythm with superimposed symmetrical sleep spindles and vertex central sharp activity.  The patient is aroused to a drowse rhythm when there is noted irregular, low voltage theta and beta activity.   The patient has one episode of right arm jerking.  No change in the background rhythm is noted during this episode.   No epileptiform activity is noted.  Hyperventilation and intermittent photic stimulation were not performed.  IMPRESSION: Normal electroencephalogram, drowsy and asleep. There are no focal lateralizing or epileptiform features.   Thana Farr, MD Neurology [phone] 10/13/2017, 6:47 PM

## 2017-10-13 NOTE — ED Provider Notes (Signed)
[facility] EMERGENCY DEPARTMENT Provider Note   CSN: [id number] Arrival date & time: 10/13/17  0827     History   Chief Complaint Chief Complaint  Patient presents with  . Fall    HPI Intisar Claudio is a 84 y.o. female.  84yo F w/ PMH including dementia, CAD, HTN, CVA who p/w fall. Pt was at memory care unit this morning and had unwitnessed fall this morning getting out of bed. Pt placed in c-collar prior to arrival. She is on plavix.  LEVEL 5 CAVEAT DUE TO DEMENTIA  The history is provided by the EMS personnel and the nursing home.  Fall     Past Medical History:  Diagnosis Date  . Abnormal liver function test   . Coronary artery disease 2004   stent RCA  . Hyperlipidemia   . Hypertension   . Irritable bowel syndrome   . Stroke [facility])     Patient Active Problem List   Diagnosis Date Noted  . Periprosthetic fracture around internal prosthetic left hip joint (HCC) 06/03/2016  . Left arm weakness   . S/P hip hemiarthroplasty   . Stroke (cerebrum) (HCC) 03/28/2016  . Essential hypertension 03/27/2016  . CAD (coronary artery disease) 03/27/2016    Past Surgical History:  Procedure Laterality Date  . FEMUR FRACTURE SURGERY    . HIP ARTHROPLASTY Left 03/27/2016   Procedure: ARTHROPLASTY BIPOLAR HIP (HEMIARTHROPLASTY);  Surgeon: Nadara Mustard, MD;  Location: [facility] OR;  Service: Orthopedics;  Laterality: Left;  . ORIF PERIPROSTHETIC FRACTURE Left 06/05/2016   Procedure: OPEN REDUCTION INTERNAL FIXATION (ORIF) LEFT PERIPROSTHETIC FEMUR FRACTURE;  Surgeon: Nadara Mustard, MD;  Location: [facility] OR;  Service: Orthopedics;  Laterality: Left;     OB History   None      Home Medications    Prior to Admission medications   Medication Sig Start Date End Date Taking? Authorizing Provider  acetaminophen (TYLENOL) 500 MG tablet Take 1 tablet (500 mg total) by mouth every 6 (six) hours as needed for mild pain. 06/06/16   Nadara Mustard, MD  citalopram (CELEXA)  20 MG tablet Take 20 mg by mouth daily.    [provider]  clopidogrel (PLAVIX) 75 MG tablet Take 1 tablet (75 mg total) by mouth daily. 03/30/16   Osvaldo Shipper, MD  enoxaparin (LOVENOX) 30 MG/0.3ML injection Inject 0.3 mLs (30 mg total) into the skin daily. For 3 weeks. 06/09/16 06/23/16  Zannie Cove, MD  feeding supplement, ENSURE ENLIVE, (ENSURE ENLIVE) LIQD Take 237 mLs by mouth 2 (two) times daily between meals. 03/30/16   Osvaldo Shipper, MD  HYDROcodone-acetaminophen (NORCO/VICODIN) 5-325 MG tablet Take 1 tablet by mouth every 6 (six) hours as needed for moderate pain. Patient not taking: Reported on 08/06/2016 06/09/16   Zannie Cove, MD  lactobacillus acidophilus (BACID) TABS tablet Take 2 tablets by mouth daily.    [provider]  loperamide (IMODIUM) 2 MG capsule Take 2 mg by mouth every 6 (six) hours as needed for diarrhea or loose stools.    [provider]  meclizine (ANTIVERT) 12.5 MG tablet Take 1 tablet (12.5 mg total) by mouth 2 (two) times daily as needed for dizziness. Patient not taking: Reported on 09/25/2017 03/30/16   Osvaldo Shipper, MD  metoprolol tartrate (LOPRESSOR) 25 MG tablet Take 25 mg by mouth 2 (two) times daily.    [provider]  Psyllium 500 MG CAPS Take 1 capsule by mouth daily.    [provider]  risperiDONE (RISPERDAL) 0.25 MG tablet Take 0.25 mg by mouth 2 (two) times daily.    [provider]  senna (SENOKOT) 8.6 MG TABS tablet Take 1 tablet by mouth daily as needed for mild constipation.    [provider]    Family History Family History  Problem Relation Age of Onset  . Hypertension Sister     Social History Social History   Tobacco Use  . Smoking status: Never Smoker  . Smokeless tobacco: Never Used  Substance Use Topics  . Alcohol use: Yes    Comment: beer rarely  . Drug use: Not on file     Allergies   Penicillins   Review of Systems Review of Systems  Unable to  perform ROS: Dementia     Physical Exam Updated Vital Signs BP (!) 171/73 (BP Location: Right Arm)   Pulse 69   Temp 97.8 F (36.6 C) (Oral)   Resp 16   SpO2 98%   Physical Exam  Constitutional: She appears well-developed. No distress.  Frail, elderly woman resting comfortably  HENT:  Head: Normocephalic and atraumatic.  Moist mucous membranes  Eyes: Pupils are equal, round, and reactive to light. Conjunctivae are normal.  Neck:  In c-collar  Cardiovascular: Normal rate, regular rhythm and normal heart sounds.  No murmur heard. Pulmonary/Chest: Effort normal and breath sounds normal. She exhibits no tenderness.  Abdominal: Soft. Bowel sounds are normal. She exhibits no distension. There is no tenderness.  Musculoskeletal: She exhibits no edema, tenderness or deformity.  Neurological:  Opens eyes to voice, non-sensical speech; global increased muscle tone w/ rigidity x all 4 ext; no hyperreflexia, withdraws all 4 ext to pain  Skin: Skin is warm and dry.  Nursing note and vitals reviewed.    ED Treatments / Results  Labs (all labs ordered are listed, but only abnormal results are displayed) Labs Reviewed  COMPREHENSIVE METABOLIC PANEL - Abnormal; Notable for the following components:      Result Value   Glucose, Bld 104 (*)    BUN 27 (*)    Creatinine, Ser 1.23 (*)    AST 48 (*)    Total Bilirubin 1.5 (*)    GFR calc non Af Amer 37 (*)    GFR calc Af Amer 42 (*)    All other components within normal limits  CBC WITH DIFFERENTIAL/PLATELET - Abnormal; Notable for the following components:   WBC 11.5 (*)    Neutro Abs 7.8 (*)    Monocytes Absolute 1.1 (*)    All other components within normal limits  URINALYSIS, ROUTINE W REFLEX MICROSCOPIC - Abnormal; Notable for the following components:   Protein, ur 30 (*)    Bacteria, UA MANY (*)    All other components within normal limits  CK - Abnormal; Notable for the following components:   Total CK 501 (*)    All other  components within normal limits  I-STAT VENOUS BLOOD GAS, ED - Abnormal; Notable for the following components:   pH, Ven 7.443 (*)    pCO2, Ven 38.2 (*)    pO2, Ven 30.0 (*)    All other components within normal limits  AMMONIA  TSH  T4, FREE  I-STAT CG4 LACTIC ACID, ED    EKG None  Radiology Dg Chest 2 View  Result Date: 10/13/2017 CLINICAL DATA:  Altered mental status. EXAM: CHEST - 2 VIEW COMPARISON:  Chest x-ray dated Jun 03, 2016. FINDINGS: The heart size and mediastinal contours are within normal limits.  Normal pulmonary vascularity. Atherosclerotic calcification of the aortic arch. No focal consolidation, pleural effusion, or pneumothorax. Unchanged elevation of the right hemidiaphragm. No acute osseous abnormality. IMPRESSION: No active cardiopulmonary disease. Electronically Signed   By: Obie Dredge M.D.   On: 10/13/2017 12:13   Ct Head Wo Contrast  Result Date: 10/13/2017 CLINICAL DATA:  84 year old female post fall. On blood thinner. Does not remember fall. Neck pain. Initial encounter. EXAM: CT HEAD WITHOUT CONTRAST CT CERVICAL SPINE WITHOUT CONTRAST TECHNIQUE: Multidetector CT imaging of the head and cervical spine was performed following the standard protocol without intravenous contrast. Multiplanar CT image reconstructions of the cervical spine were also generated. COMPARISON:  06/03/2016 CT. FINDINGS: CT HEAD FINDINGS Brain: No intracranial hemorrhage or CT evidence of large acute infarct. Prominent chronic microvascular changes. Global atrophy. No intracranial mass lesion noted on this unenhanced exam. Vascular: Vascular calcifications.  No hyperdense vessel Skull: No skull fracture Sinuses/Orbits: Post lens replacement. No acute orbital abnormality. Minimal polypoid opacification right maxillary sinus and minimal mucosal thickening left maxillary sinus. Minimal partial opacification inferior left mastoid air cells. No obstructing lesion of eustachian tube noted. Other:  Soft tissue swelling/hematoma occipital region greater to the left of midline. CT CERVICAL SPINE FINDINGS Alignment: 3 mm anterior slip C3 new from prior examination. This probably is related to facet degenerative changes which have progressed on the left when compared to prior exam. Mild curvature cervical spine convex right. Skull base and vertebrae: No cervical spine fracture. Soft tissues and spinal canal: No abnormal prevertebral soft tissue swelling. Disc levels: Cervical spondylotic changes with spinal stenosis and cord flattening C3-4 through C5-6. Bilateral foraminal narrowing at these levels. Upper chest: Scarring lung apices greater on right. Other: Bilateral carotid bifurcation calcifications. IMPRESSION: CT HEAD 1. Soft tissue swelling/hematoma occipital region without underlying fracture or intracranial hemorrhage. 2. Prominent chronic microvascular changes. Global atrophy. 3. Minimal polypoid opacification right maxillary sinus and minimal mucosal thickening left maxillary sinus. Minimal partial opacification inferior left mastoid air cells. No obstructing lesion of eustachian tube noted. CT CERVICAL SPINE 1. 3 mm anterior slip C3 new from prior examination. This probably is related to facet degenerative changes which have progressed on the left when compared to prior exam. 2. No cervical spine fracture or abnormal prevertebral soft tissue swelling. 3. Cervical spondylotic changes with spinal stenosis and cord flattening C3-4 through C5-6. Bilateral foraminal narrowing at these levels. Electronically Signed   By: Lacy Duverney M.D.   On: 10/13/2017 10:13   Ct Cervical Spine Wo Contrast  Result Date: 10/13/2017 CLINICAL DATA:  84 year old female post fall. On blood thinner. Does not remember fall. Neck pain. Initial encounter. EXAM: CT HEAD WITHOUT CONTRAST CT CERVICAL SPINE WITHOUT CONTRAST TECHNIQUE: Multidetector CT imaging of the head and cervical spine was performed following the standard  protocol without intravenous contrast. Multiplanar CT image reconstructions of the cervical spine were also generated. COMPARISON:  06/03/2016 CT. FINDINGS: CT HEAD FINDINGS Brain: No intracranial hemorrhage or CT evidence of large acute infarct. Prominent chronic microvascular changes. Global atrophy. No intracranial mass lesion noted on this unenhanced exam. Vascular: Vascular calcifications.  No hyperdense vessel Skull: No skull fracture Sinuses/Orbits: Post lens replacement. No acute orbital abnormality. Minimal polypoid opacification right maxillary sinus and minimal mucosal thickening left maxillary sinus. Minimal partial opacification inferior left mastoid air cells. No obstructing lesion of eustachian tube noted. Other: Soft tissue swelling/hematoma occipital region greater to the left of midline. CT CERVICAL SPINE FINDINGS Alignment: 3 mm anterior slip C3 new from  prior examination. This probably is related to facet degenerative changes which have progressed on the left when compared to prior exam. Mild curvature cervical spine convex right. Skull base and vertebrae: No cervical spine fracture. Soft tissues and spinal canal: No abnormal prevertebral soft tissue swelling. Disc levels: Cervical spondylotic changes with spinal stenosis and cord flattening C3-4 through C5-6. Bilateral foraminal narrowing at these levels. Upper chest: Scarring lung apices greater on right. Other: Bilateral carotid bifurcation calcifications. IMPRESSION: CT HEAD 1. Soft tissue swelling/hematoma occipital region without underlying fracture or intracranial hemorrhage. 2. Prominent chronic microvascular changes. Global atrophy. 3. Minimal polypoid opacification right maxillary sinus and minimal mucosal thickening left maxillary sinus. Minimal partial opacification inferior left mastoid air cells. No obstructing lesion of eustachian tube noted. CT CERVICAL SPINE 1. 3 mm anterior slip C3 new from prior examination. This probably is  related to facet degenerative changes which have progressed on the left when compared to prior exam. 2. No cervical spine fracture or abnormal prevertebral soft tissue swelling. 3. Cervical spondylotic changes with spinal stenosis and cord flattening C3-4 through C5-6. Bilateral foraminal narrowing at these levels. Electronically Signed   By: Lacy Duverney M.D.   On: 10/13/2017 10:13    Procedures Procedures (including critical care time)  Medications Ordered in ED Medications  LORazepam (ATIVAN) injection 1 mg (1 mg Intravenous Given 10/13/17 1121)  sodium chloride 0.9 % bolus 1,000 mL (1,000 mLs Intravenous Bolus 10/13/17 1121)  LORazepam (ATIVAN) injection 1 mg (1 mg Intravenous Given 10/13/17 1447)     Initial Impression / Assessment and Plan / ED Course  I have reviewed the triage vital signs and the nursing notes.  Pertinent imaging results that were available during my care of the patient were reviewed by me and considered in my medical decision making (see chart for details).     Non-toxic, stable VS. No external signs of trauma.  CT head and C-spine negative for acute injury.  Degenerative changes of C-spine noted.  Daughter arrived and stated that patient is very different from her baseline.  Normally she is conversant and able to dress herself but has been nonverbal here and daughter is concerned about her drastically different mental status today.  Afebrile here.  Work-up has showed no findings of infection in UA or chest x-ray, normal thyroid studies, negative ammonia, dermal lactate, reassuring VBG, creatinine 1.23.  Gave the patient a fluid bolus and dose of Ativan which did not significantly improve her rigidity.  Daughter noted that she had a change in her depression medications and I have reviewed her MAR from facility which shows she is on Abilify, prozac, and wellbutrin. I considered serotonin syndrome but afebrile, no tachycardia, and no sustained clonus. Consulted neurology,  Dr. Wilford Corner evaluated pt. he is concerned about Parkinsonian- like reaction to Abilify.  Ordered an EEG to rule out seizures.  He has recommended trial of benzos and stopping all of these medications.  I have also ordered fluid bolus.  Discussed admission with [facility], Dr. Lajuana Ripple, who will admit for further w/u.   Final Clinical Impressions(s) / ED Diagnoses   Final diagnoses:  Altered mental status, unspecified altered mental status type  Muscle rigidity    ED Discharge Orders    None       Davina Howlett, Ambrose Finland, MD 10/13/17 1552

## 2017-10-13 NOTE — Progress Notes (Signed)
EEG complete - results pending 

## 2017-10-13 NOTE — Consult Note (Addendum)
Neurology Consultation  Reason for Consult: Altered mental status/stiffness Referring Physician: Dr. Clarene Duke  CC: Altered mental status/stiffness  History is obtained from: Chart, ED provider  HPI: Jessica Booth is a 84 y.o. female 84 year old woman with a past medical history of dementia, coronary artery disease, hypertension, history of old stroke with unknown residual deficits brought in from memory care unit this morning after she had an unwitnessed fall trying to get out of bed.  She was placed in c-collar and brought into the ER. Initial evaluation was done per trauma protocol to rule out head and neck injury, which was ruled out with imaging. She was found to be very stiff in her muscles and altered, for which the ED provider sought the neurology consult. There is no reported history of seizures.  She but is normally conversant according to the note review.  Work-up for infectious sources was done and was unremarkable. She received a dose of Ativan but did not have any significant change in her exam. Her medication list includes Abilify, Prozac and Wellbutrin.  She is also on metoprolol. An EEG was ordered to rule out seizures. I was unable to speak with any family that could provide more information to me at this time.  Upon chart review, according to the H&P, the patient has been having more hallucinations and has been exhibiting some behavioral problems as well over the past 6 months.  She does have depression with a long visual hallucinations and sees people bugs crawling.  She has had a lot of medication adjustments in the last 6 months and Abilify was recently started in August and Wellbutrin in October and her dose of Lexapro was increased in October as well.   ROS: Unable to obtain due to altered mental status.   Past Medical History:  Diagnosis Date  . Abnormal liver function test   . Coronary artery disease 2004   stent RCA  . Hyperlipidemia   . Hypertension   .  Irritable bowel syndrome   . Stroke [facility])     Family History  Problem Relation Age of Onset  . Hypertension Sister    Social History:   reports that she has never smoked. She has never used smokeless tobacco. She reports that she drinks alcohol. Her drug history is not on file.  Medications  Current Facility-Administered Medications:  .  acetaminophen (TYLENOL) tablet 650 mg, 650 mg, Oral, Q6H PRN **OR** acetaminophen (TYLENOL) suppository 650 mg, 650 mg, Rectal, Q6H PRN, Kamineni, Neelima, MD .  benztropine (COGENTIN) tablet 1 mg, 1 mg, Oral, BID, Alessandra Bevels, MD .  Melene Muller ON 10/14/2017] clopidogrel (PLAVIX) tablet 75 mg, 75 mg, Per NG tube, Daily, Kamineni, Neelima, MD .  enoxaparin (LOVENOX) injection 40 mg, 40 mg, Subcutaneous, Q24H, Kamineni, Neelima, MD .  feeding supplement (ENSURE ENLIVE) (ENSURE ENLIVE) liquid 237 mL, 237 mL, Per Tube, TID, Kamineni, Neelima, MD .  lactated ringers infusion, , Intravenous, Continuous, Kamineni, Neelima, MD .  LORazepam (ATIVAN) injection 2 mg, 2 mg, Intravenous, Q4H PRN, Kamineni, Neelima, MD .  metoprolol tartrate (LOPRESSOR) tablet 25 mg, 25 mg, Per Tube, BID, Alessandra Bevels, MD .  Melene Muller ON 10/14/2017] multivitamin with minerals tablet 1 tablet, 1 tablet, Per Tube, Daily, Kamineni, Neelima, MD .  ondansetron (ZOFRAN) tablet 4 mg, 4 mg, Oral, Q6H PRN **OR** ondansetron (ZOFRAN) injection 4 mg, 4 mg, Intravenous, Q6H PRN, Lajuana Ripple, Neelima, MD .  psyllium (HYDROCIL/METAMUCIL) packet 1 packet, 1 packet, Per Tube, QHS, Alessandra Bevels, MD   Exam:  Current vital signs: BP 139/71 (BP Location: Right Arm)   Pulse 81   Temp 99.5 F (37.5 C) (Axillary)   Resp 18   Wt 57.1 kg   SpO2 95%   BMI 22.30 kg/m  Vital signs in last 24 hours: Temp:  [97.8 F (36.6 C)-99.5 F (37.5 C)] 99.5 F (37.5 C) (10/07 1945) Pulse Rate:  [63-91] 81 (10/07 1830) Resp:  [15-28] 18 (10/07 1830) BP: (112-176)/(53-93) 139/71 (10/07 1945) SpO2:  [94  %-99 %] 95 % (10/07 1830) Weight:  [57.1 kg] 57.1 kg (10/07 1945) General: Patient's very drowsy, does not open eyes to voice but is in no apparent distress HEENT: Normocephalic atraumatic CVs: S1-S2 regular rate rhythm Neurological exam Patient is drowsy, does not open eyes to voice, appears to attempt to withdraw from noxious stim but does not open eyes to noxious stim either. Completely nonverbal. Speech cannot be assessed Does not follow commands Cranial nerves: Pupils are equal round react to light, no gaze preference, doll's eye reflex present, face appears symmetric. Motor exam: Increased tone in all 4 extremities worse on the right with some resting tremor in the right upper extremity along with cogwheeling.  She also exhibits paratonia more than the actual hypertonia. Sensory exam: Withdraws for attempts to withdraw to noxious stimulus in all 4 extremities Did not perform coordination testing or gait testing.  Labs I have reviewed labs in epic and the results pertinent to this consultation are: CBC    Component Value Date/Time   WBC 11.5 (H) 10/13/2017 1105   RBC 3.92 10/13/2017 1105   HGB 13.0 10/13/2017 1105   HCT 39.1 10/13/2017 1105   PLT 238 10/13/2017 1105   MCV 99.7 10/13/2017 1105   MCH 33.2 10/13/2017 1105   MCHC 33.2 10/13/2017 1105   RDW 11.9 10/13/2017 1105   LYMPHSABS 2.5 10/13/2017 1105   MONOABS 1.1 (H) 10/13/2017 1105   EOSABS 0.0 10/13/2017 1105   BASOSABS 0.0 10/13/2017 1105    CMP     Component Value Date/Time   NA 138 10/13/2017 1105   K 4.6 10/13/2017 1105   CL 105 10/13/2017 1105   CO2 23 10/13/2017 1105   GLUCOSE 104 (H) 10/13/2017 1105   BUN 27 (H) 10/13/2017 1105   CREATININE 1.23 (H) 10/13/2017 1105   CALCIUM 9.5 10/13/2017 1105   PROT 7.1 10/13/2017 1105   ALBUMIN 4.3 10/13/2017 1105   AST 48 (H) 10/13/2017 1105   ALT 30 10/13/2017 1105   ALKPHOS 45 10/13/2017 1105   BILITOT 1.5 (H) 10/13/2017 1105   GFRNONAA 37 (L) 10/13/2017  1105   GFRAA 42 (L) 10/13/2017 1105  CK 501  Imaging I have reviewed the images obtained:  CT-scan of the brain-no acute changes in the brain parenchyma but small soft tissue swelling/hematoma in the occipital region without underlying fracture or ICH. CT C-spine shows 3 mm anterior slip of the C3 which is new from prior exam probably related to facet degenerative changes that have progressed.  Assessment:  84 year old woman past history of dementia coronary artery disease hypertension history of old skull with unknown residual deficits with worsening hallucinations and memory over the past few weeks to months brought in after an unwitnessed fall from her memory care unit. Examination suggestive of possible parkinsonism and the history of worsening dementia with hallucinations can raise a possibility of underlying Lewy body dementia. Another possible consideration because of medication such as Abilify side effects from Abilify which is known to cause parkinsonian symptoms. Other  differentials include serotonin syndrome, although she did not have fevers or tachycardia but she is also on metoprolol and might not mount the responses of tachycardia and might only present with muscle stiffness, altered mental status etc. Other differential is rhabdomyolysis.  Recommendations: D/C Abilify Can try low-dose benzodiazepines EEG was done-no seizure activity Obtain MRI of the brain when possible to evaluate for any underlying stroke or other structural abnormality. Monitor clinically with frequent neurochecks. Maintain seizure precautions although seizures are low on differential but are common in the dementia. I would not recommend any antiepileptics for now. Correction of toxic metabolic derangements (rhabdo) We will follow with you.    -- Milon Dikes, MD [facility] Pager: [phone] If 7pm to 7am, please call on call as listed on [facility].

## 2017-10-13 NOTE — ED Triage Notes (Signed)
Per ems- pt coming from memory unit at [facility]. Had unwitnessed fall this morning, is on plavix. Unsure how long down, but staff reports she fell sometime this morning getting out of bed. Pt doesn't remember fall. C/o neck pain, wearing c-collar. BP 168 palpated, HR 78, CBG 122, 95% RA.

## 2017-10-13 NOTE — ED Notes (Signed)
EEG at bedside.

## 2017-10-13 NOTE — ED Notes (Signed)
ED Provider at bedside. 

## 2017-10-13 NOTE — Progress Notes (Signed)
Pt admitted from ED s/p fall at the nursing facility, pt non verbal but respond to pain, settled in bed with call light within pt's reach, tele monitor put and verified on pt, safety concern addressed accordingly, will however continue to monitor, v/s stable. Obasogie-Asidi, Taren Toops Efe

## 2017-10-14 ENCOUNTER — Inpatient Hospital Stay (HOSPITAL_COMMUNITY): Payer: Medicare Other

## 2017-10-14 DIAGNOSIS — R29898 Other symptoms and signs involving the musculoskeletal system: Secondary | ICD-10-CM

## 2017-10-14 DIAGNOSIS — R627 Adult failure to thrive: Secondary | ICD-10-CM

## 2017-10-14 DIAGNOSIS — Z515 Encounter for palliative care: Secondary | ICD-10-CM

## 2017-10-14 DIAGNOSIS — G9341 Metabolic encephalopathy: Secondary | ICD-10-CM

## 2017-10-14 DIAGNOSIS — Z66 Do not resuscitate: Secondary | ICD-10-CM

## 2017-10-14 DIAGNOSIS — Z8659 Personal history of other mental and behavioral disorders: Secondary | ICD-10-CM

## 2017-10-14 DIAGNOSIS — F0391 Unspecified dementia with behavioral disturbance: Secondary | ICD-10-CM

## 2017-10-14 DIAGNOSIS — F03918 Unspecified dementia, unspecified severity, with other behavioral disturbance: Secondary | ICD-10-CM

## 2017-10-14 DIAGNOSIS — R4182 Altered mental status, unspecified: Secondary | ICD-10-CM

## 2017-10-14 DIAGNOSIS — Z8673 Personal history of transient ischemic attack (TIA), and cerebral infarction without residual deficits: Secondary | ICD-10-CM

## 2017-10-14 DIAGNOSIS — I5030 Unspecified diastolic (congestive) heart failure: Secondary | ICD-10-CM

## 2017-10-14 LAB — ECHOCARDIOGRAM COMPLETE: Weight: 2014.12 oz

## 2017-10-14 LAB — CK: Total CK: 761 U/L — ABNORMAL HIGH (ref 38–234)

## 2017-10-14 MED ORDER — METOPROLOL TARTRATE 25 MG PO TABS
25.0000 mg | ORAL_TABLET | Freq: Two times a day (BID) | ORAL | Status: DC
  Administered 2017-10-14: 25 mg via ORAL
  Filled 2017-10-14 (×3): qty 1

## 2017-10-14 MED ORDER — ACETAMINOPHEN 325 MG PO TABS
650.0000 mg | ORAL_TABLET | Freq: Three times a day (TID) | ORAL | Status: DC
  Administered 2017-10-14 (×2): 650 mg via ORAL
  Filled 2017-10-14 (×2): qty 2

## 2017-10-14 MED ORDER — ADULT MULTIVITAMIN W/MINERALS CH: 1.0000 | ORAL_TABLET | Freq: Every day | ORAL | Status: DC

## 2017-10-14 MED ORDER — SODIUM CHLORIDE 0.9 % IV SOLN
INTRAVENOUS | Status: DC
  Administered 2017-10-14 – 2017-10-15 (×2): via INTRAVENOUS

## 2017-10-14 MED ORDER — CLOPIDOGREL BISULFATE 75 MG PO TABS
75.0000 mg | ORAL_TABLET | Freq: Every day | ORAL | Status: DC
  Filled 2017-10-14 (×2): qty 1

## 2017-10-14 MED ORDER — ENOXAPARIN SODIUM 30 MG/0.3ML ~~LOC~~ SOLN
30.0000 mg | SUBCUTANEOUS | Status: DC
  Administered 2017-10-14: 30 mg via SUBCUTANEOUS
  Filled 2017-10-14: qty 0.3

## 2017-10-14 NOTE — Progress Notes (Signed)
  Echocardiogram 2D Echocardiogram has been performed.  Carter Kassel L Androw 10/14/2017, 1:21 PM

## 2017-10-14 NOTE — Evaluation (Signed)
Clinical/Bedside Swallow Evaluation Patient Details  Name: Maigan Bittinger MRN: [id number] Date of Birth: [birth year]  Today's Date: 10/14/2017 Time: SLP Start Time (ACUTE ONLY): 1434 SLP Stop Time (ACUTE ONLY): 1454 SLP Time Calculation (min) (ACUTE ONLY): 20 min  Past Medical History:  Past Medical History:  Diagnosis Date  . Abnormal liver function test   . Coronary artery disease 2004   stent RCA  . Hyperlipidemia   . Hypertension   . Irritable bowel syndrome   . Stroke [facility])    Past Surgical History:  Past Surgical History:  Procedure Laterality Date  . FEMUR FRACTURE SURGERY    . HIP ARTHROPLASTY Left 03/27/2016   Procedure: ARTHROPLASTY BIPOLAR HIP (HEMIARTHROPLASTY);  Surgeon: Nadara Mustard, MD;  Location: [facility] OR;  Service: Orthopedics;  Laterality: Left;  . ORIF PERIPROSTHETIC FRACTURE Left 06/05/2016   Procedure: OPEN REDUCTION INTERNAL FIXATION (ORIF) LEFT PERIPROSTHETIC FEMUR FRACTURE;  Surgeon: Nadara Mustard, MD;  Location: [facility];  Service: Orthopedics;  Laterality: Left;   HPI:  84 y.o. female with history h/o hypertension, hyperlipidemia, IBS, CVA in 2018 with mild residual left arm weakness, dementia associated with hallucinations, depression and CAD on Plavix at baseline brought in after found down by ALF staff. Dx acute metabolic encephalopathy with neuromuscular rigidity.  MRI negative.    Assessment / Plan / Recommendation Clinical Impression  Pt confused, unable to follow commands, speech is clear but nonsensical.  Pt with NG in place; blood at right nare.  Face symmetric at rest. Required cues to accept POs, tended to blow bubbles/exhale with straw instead of inhaling.  Once she understood task, she drank water and had several bites of pudding with good recognition/anticipation, brisk swallow response, no s/s of aspiration.  She declined soft solids.  Recommend advancing diet to mechanical soft, thin liquids, give meds whole in puree.  Palliative medicine has been  consulted.  No SLP f/u is needed - our service will sign off.  SLP Visit Diagnosis: Dysphagia, unspecified (R13.10)    Aspiration Risk  No limitations    Diet Recommendation   mech soft, thin liquids  Medication Administration: Whole meds with puree    Other  Recommendations Oral Care Recommendations: Oral care BID   Follow up Recommendations None      Frequency and Duration            Prognosis        Swallow Study   General Date of Onset: 10/13/17 HPI: 84 y.o. female with history h/o hypertension, hyperlipidemia, IBS, CVA in 2018 with mild residual left arm weakness, dementia associated with hallucinations, depression and CAD on Plavix at baseline brought in after found down by ALF staff. Dx acute metabolic encephalopathy with neuromuscular rigidity.  MRI negative.  Type of Study: Bedside Swallow Evaluation Previous Swallow Assessment: March 2018 with no remarkable findings Diet Prior to this Study: NPO;NG Tube Temperature Spikes Noted: Yes Respiratory Status: Room air History of Recent Intubation: No Behavior/Cognition: Alert;Confused Oral Cavity Assessment: Within Functional Limits Oral Care Completed by SLP: Recent completion by staff Oral Cavity - Dentition: Adequate natural dentition Vision: Functional for self-feeding Self-Feeding Abilities: Needs assist Patient Positioning: Upright in bed Baseline Vocal Quality: Normal Volitional Cough: Cognitively unable to elicit Volitional Swallow: Unable to elicit    Oral/Motor/Sensory Function Overall Oral Motor/Sensory Function: Within functional limits   Ice Chips Ice chips: Within functional limits   Thin Liquid Thin Liquid: Within functional limits    Nectar Thick Nectar Thick Liquid: Not tested  Honey Thick Honey Thick Liquid: Not tested   Puree Puree: Within functional limits   Solid     Solid: (pt declined)      Dhrithi Riche Laurice 10/14/2017,3:13 PM   Jaiyla Granados L. Samson Frederic, MA CCC/SLP Acute  Rehabilitation Services Office number [phone] Pager [phone]
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
## 2017-10-14 NOTE — Progress Notes (Addendum)
[facility] PROGRESS NOTE  Doral Digangi ZOX:[id number] DOB: 18-May-1922 DOA: 10/13/2017  PCP: Vivien Presto, MD  Brief History/Interval Summary: 84 y.o. female with history h/o hypertension, hyperlipidemia, IBS, CVA in 2018 with mild residual left arm weakness, dementia associated with hallucinations, depression and CAD on Plavix at baseline brought in after found down by ALF staff.    She was noted to be nonverbal.  Most of the history was provided by the granddaughter.  Apparently started on Abilify recently.  Also started on Wellbutrin on October 1.  Dose of Lexapro was increased from 15 to 20 mg in October.  Patient was hospitalized for further management.  Neurology and psychiatry were consulted.  Reason for Visit: Acute metabolic encephalopathy  Consultants: Neurology and psychiatry  Procedures:  EEG Normal electroencephalogram, drowsy and asleep. There are no focal lateralizing or epileptiform features.  Antibiotics: None  Subjective/Interval History: Patient noted to be awake alert.  Distracted.  Her granddaughter is at the bedside.  Patient denies any complaints.  Still not answering questions appropriately.  Granddaughter feels that the patient has improved but not back to her baseline.  ROS: Unable to do due to her encephalopathy  Objective:  Vital Signs  Vitals:   10/14/17 0227 10/14/17 0348 10/14/17 0428 10/14/17 1018  BP: (!) 145/89  (!) 147/64 134/66  Pulse: 71  62 66  Resp: 18  16   Temp:  98.6 F (37 C)    TempSrc:  Axillary    SpO2: 97%  95%   Weight:        Intake/Output Summary (Last 24 hours) at 10/14/2017 1144 Last data filed at 10/14/2017 0800 Gross per 24 hour  Intake 986.08 ml  Output -  Net 986.08 ml   Filed Weights   10/13/17 1945  Weight: 57.1 kg    General appearance: alert, cooperative, appears stated age, distracted and no distress Head: Normocephalic, without obvious abnormality, atraumatic Resp: Normal effort.   Diminished air entry at the bases.  No definite wheezing rales or rhonchi. Cardio: regular rate and rhythm, S1, S2 normal, no murmur, click, rub or gallop GI: soft, non-tender; bowel sounds normal; no masses,  no organomegaly Extremities: extremities normal, atraumatic, no cyanosis or edema Pulses: 2+ and symmetric Neurologic: She is awake alert.  Follows certain commands.  Still not answering questions appropriately.  No obvious focal neurological deficits noted.  Lab Results:  Data Reviewed: I have personally reviewed following labs and imaging studies  CBC: Recent Labs  Lab 10/13/17 1105  WBC 11.5*  NEUTROABS 7.8*  HGB 13.0  HCT 39.1  MCV 99.7  PLT 238    Basic Metabolic Panel: Recent Labs  Lab 10/13/17 1105  NA 138  K 4.6  CL 105  CO2 23  GLUCOSE 104*  BUN 27*  CREATININE 1.23*  CALCIUM 9.5    GFR: CrCl cannot be calculated (Unknown ideal weight.).  Liver Function Tests: Recent Labs  Lab 10/13/17 1105  AST 48*  ALT 30  ALKPHOS 45  BILITOT 1.5*  PROT 7.1  ALBUMIN 4.3     Recent Labs  Lab 10/13/17 1434  AMMONIA 25    Cardiac Enzymes: Recent Labs  Lab 10/13/17 1105 10/14/17 0931  CKTOTAL 501* 761*    Thyroid Function Tests: Recent Labs    10/13/17 1434  TSH 3.189  FREET4 0.88     Radiology Studies: Dg Chest 2 View  Result Date: 10/13/2017 CLINICAL DATA:  Altered mental status. EXAM: CHEST - 2 VIEW COMPARISON:  Chest x-ray dated Jun 03, 2016. FINDINGS: The heart size and mediastinal contours are within normal limits. Normal pulmonary vascularity. Atherosclerotic calcification of the aortic arch. No focal consolidation, pleural effusion, or pneumothorax. Unchanged elevation of the right hemidiaphragm. No acute osseous abnormality. IMPRESSION: No active cardiopulmonary disease. Electronically Signed   By: Obie Dredge M.D.   On: 10/13/2017 12:13   Ct Head Wo Contrast  Result Date: 10/13/2017 CLINICAL DATA:  84 year old female post  fall. On blood thinner. Does not remember fall. Neck pain. Initial encounter. EXAM: CT HEAD WITHOUT CONTRAST CT CERVICAL SPINE WITHOUT CONTRAST TECHNIQUE: Multidetector CT imaging of the head and cervical spine was performed following the standard protocol without intravenous contrast. Multiplanar CT image reconstructions of the cervical spine were also generated. COMPARISON:  06/03/2016 CT. FINDINGS: CT HEAD FINDINGS Brain: No intracranial hemorrhage or CT evidence of large acute infarct. Prominent chronic microvascular changes. Global atrophy. No intracranial mass lesion noted on this unenhanced exam. Vascular: Vascular calcifications.  No hyperdense vessel Skull: No skull fracture Sinuses/Orbits: Post lens replacement. No acute orbital abnormality. Minimal polypoid opacification right maxillary sinus and minimal mucosal thickening left maxillary sinus. Minimal partial opacification inferior left mastoid air cells. No obstructing lesion of eustachian tube noted. Other: Soft tissue swelling/hematoma occipital region greater to the left of midline. CT CERVICAL SPINE FINDINGS Alignment: 3 mm anterior slip C3 new from prior examination. This probably is related to facet degenerative changes which have progressed on the left when compared to prior exam. Mild curvature cervical spine convex right. Skull base and vertebrae: No cervical spine fracture. Soft tissues and spinal canal: No abnormal prevertebral soft tissue swelling. Disc levels: Cervical spondylotic changes with spinal stenosis and cord flattening C3-4 through C5-6. Bilateral foraminal narrowing at these levels. Upper chest: Scarring lung apices greater on right. Other: Bilateral carotid bifurcation calcifications. IMPRESSION: CT HEAD 1. Soft tissue swelling/hematoma occipital region without underlying fracture or intracranial hemorrhage. 2. Prominent chronic microvascular changes. Global atrophy. 3. Minimal polypoid opacification right maxillary sinus and  minimal mucosal thickening left maxillary sinus. Minimal partial opacification inferior left mastoid air cells. No obstructing lesion of eustachian tube noted. CT CERVICAL SPINE 1. 3 mm anterior slip C3 new from prior examination. This probably is related to facet degenerative changes which have progressed on the left when compared to prior exam. 2. No cervical spine fracture or abnormal prevertebral soft tissue swelling. 3. Cervical spondylotic changes with spinal stenosis and cord flattening C3-4 through C5-6. Bilateral foraminal narrowing at these levels. Electronically Signed   By: Lacy Duverney M.D.   On: 10/13/2017 10:13   Ct Cervical Spine Wo Contrast  Result Date: 10/13/2017 CLINICAL DATA:  84 year old female post fall. On blood thinner. Does not remember fall. Neck pain. Initial encounter. EXAM: CT HEAD WITHOUT CONTRAST CT CERVICAL SPINE WITHOUT CONTRAST TECHNIQUE: Multidetector CT imaging of the head and cervical spine was performed following the standard protocol without intravenous contrast. Multiplanar CT image reconstructions of the cervical spine were also generated. COMPARISON:  06/03/2016 CT. FINDINGS: CT HEAD FINDINGS Brain: No intracranial hemorrhage or CT evidence of large acute infarct. Prominent chronic microvascular changes. Global atrophy. No intracranial mass lesion noted on this unenhanced exam. Vascular: Vascular calcifications.  No hyperdense vessel Skull: No skull fracture Sinuses/Orbits: Post lens replacement. No acute orbital abnormality. Minimal polypoid opacification right maxillary sinus and minimal mucosal thickening left maxillary sinus. Minimal partial opacification inferior left mastoid air cells. No obstructing lesion of eustachian tube noted. Other: Soft tissue swelling/hematoma occipital region greater  to the left of midline. CT CERVICAL SPINE FINDINGS Alignment: 3 mm anterior slip C3 new from prior examination. This probably is related to facet degenerative changes which  have progressed on the left when compared to prior exam. Mild curvature cervical spine convex right. Skull base and vertebrae: No cervical spine fracture. Soft tissues and spinal canal: No abnormal prevertebral soft tissue swelling. Disc levels: Cervical spondylotic changes with spinal stenosis and cord flattening C3-4 through C5-6. Bilateral foraminal narrowing at these levels. Upper chest: Scarring lung apices greater on right. Other: Bilateral carotid bifurcation calcifications. IMPRESSION: CT HEAD 1. Soft tissue swelling/hematoma occipital region without underlying fracture or intracranial hemorrhage. 2. Prominent chronic microvascular changes. Global atrophy. 3. Minimal polypoid opacification right maxillary sinus and minimal mucosal thickening left maxillary sinus. Minimal partial opacification inferior left mastoid air cells. No obstructing lesion of eustachian tube noted. CT CERVICAL SPINE 1. 3 mm anterior slip C3 new from prior examination. This probably is related to facet degenerative changes which have progressed on the left when compared to prior exam. 2. No cervical spine fracture or abnormal prevertebral soft tissue swelling. 3. Cervical spondylotic changes with spinal stenosis and cord flattening C3-4 through C5-6. Bilateral foraminal narrowing at these levels. Electronically Signed   By: Lacy Duverney M.D.   On: 10/13/2017 10:13   Mr Brain Wo Contrast  Result Date: 10/14/2017 CLINICAL DATA:  Altered mental status. EXAM: MRI HEAD WITHOUT CONTRAST TECHNIQUE: Multiplanar, multiecho pulse sequences of the brain and surrounding structures were obtained without intravenous contrast. COMPARISON:  Head CT 10/13/2017 FINDINGS: BRAIN: There is no acute infarct, acute hemorrhage, hydrocephalus or extra-axial collection. The midline structures are normal. No midline shift or other mass effect. Diffuse confluent hyperintense T2-weighted signal within the periventricular, deep and juxtacortical white matter,  most commonly due to chronic ischemic microangiopathy. There is diffuse, severe atrophy. Susceptibility-sensitive sequences show no chronic microhemorrhage or superficial siderosis. VASCULAR: Major intracranial arterial and venous sinus flow voids are normal. SKULL AND UPPER CERVICAL SPINE: Calvarial bone marrow signal is normal. There is no skull base mass. Visualized upper cervical spine and soft tissues are normal. SINUSES/ORBITS: No fluid levels or advanced mucosal thickening. No mastoid or middle ear effusion. There are bilateral lens replacements. IMPRESSION: Diffuse, severe atrophy of the brain and chronic ischemic microangiopathy without acute infarct. Electronically Signed   By: Deatra Robinson M.D.   On: 10/14/2017 01:51   Dg Abd Portable 1 View  Result Date: 10/13/2017 CLINICAL DATA:  Nasogastric tube placement. EXAM: PORTABLE ABDOMEN - 1 VIEW COMPARISON:  Chest radiographs obtained earlier today. FINDINGS: Interval nasogastric tube with its tip and side hole in the stomach. The included bowel gas pattern is normal. Cholecystectomy clips. Normal sized heart. Clear included lungs. Moderate dextroconvex lumbar scoliosis. Mild thoracolumbar spine degenerative changes. IMPRESSION: Nasogastric tube tip and side hole in the stomach. No acute abnormality. Electronically Signed   By: Beckie Salts M.D.   On: 10/13/2017 17:32     Medications:  Scheduled: . benztropine  1 mg Oral BID  . clopidogrel  75 mg Per NG tube Daily  . enoxaparin (LOVENOX) injection  30 mg Subcutaneous Q24H  . feeding supplement (ENSURE ENLIVE)  237 mL Per Tube TID  . metoprolol tartrate  25 mg Per Tube BID  . multivitamin with minerals  1 tablet Per Tube Daily  . psyllium  1 packet Per Tube QHS   Continuous: . lactated ringers 100 mL/hr at 10/13/17 2021   ZOX:[id number] **OR** acetaminophen, LORazepam, ondansetron **OR** ondansetron (  ZOFRAN) IV  Assessment/Plan:    Acute metabolic encephalopathy with neuromuscular  rigidity CK was noted to be mildly elevated at 500.  CK level noted to be in the 700s today.  We will change to normal saline infusion.  Recheck CK tomorrow.  Patient was seen by neurology.  Patient recently started on Abilify in August and Wellbutrin in October.  Dose of Lexapro increased in October.  Encephalopathy most likely due to medications.  Discussed with neurology today.  MRI does not show any acute findings.  All the inciting agents have been held. She was started on benztropine EEG did not show any epileptiform activity.  NG tube was placed for medication administration.  Now that she is more awake alert we will request speech therapy to evaluate her swallow ability.  Hopefully the NG tube can be discontinued soon.  PT and OT evaluation.  TSH was normal.  Ammonia level normal.  History of depression/dementia with a history of hallucinations All of her psychotropic medications are on hold.  Psychiatry has been consulted to assist with the medication management.  Abilify thought to be the reason for her presentation.  Wellbutrin also on hold.  History of stroke Apparently has left upper extremity weakness at baseline.  MRI did not show any new stroke.  Continue her Plavix and statin.  Echocardiogram was ordered by the admitting provider.  History of essential hypertension Monitor blood pressures closely.  Noted to be on metoprolol.  DVT Prophylaxis: Lovenox    Code Status: DNR Family Communication: Discussed with the patient's granddaughter Disposition Plan: She is from assisted living facility.  PT and OT to see.  Patient seems to be improving.  Disposition not clear yet.  May need high level of care.  Await psychiatry input.    LOS: 1 day   Osvaldo Shipper  [facility] Pager [phone] 10/14/2017, 11:44 AM  If 7PM-7AM, please contact night-coverage at [email or web address], password [email or web address]

## 2017-10-14 NOTE — Consult Note (Signed)
Consultation Note Date: 10/14/2017   Patient Name: Jessica Booth  DOB: Apr 10, 1922  MRN: [id number]  Age / Sex: 84 y.o., female  PCP: Vivien Presto, MD Referring Physician: Osvaldo Shipper, MD  Reason for Consultation: Establishing goals of care and Psychosocial/spiritual support  HPI/Patient Profile: 84 y.o. female  admitted on 10/13/2017 with  altered mental status. She was found down by staff at her ALF/[facility] with milk spilled on the floor.   Baseline status per Grand-daughter reports wheelchair bound status and slow physical and functional continued decline over the past year., over all failure to thrive.  Patient has been seen by community based palliative care/[facility] several times in the past  Her medications were recently adjusted for dementia with psychosis, with visual hallucinations She has known periodic VH of people and bugs. She was started on Abilify in August for worsening psychosis. She was started on Wellbutrin on 10/1 and Lexapro was increased from 15 mg to 20 mg daily.   She was nonverbal and unable to follow commands when she presented to the ED. She has had neurology and psychiatric consult.    EEG was unremarkable for seizure activity.  There is concern for Lewy Body Dementia due to worsening due to worsening hallucinations and parkinsonian symptoms after starting Abilify and less likely serotonin syndrome due to absence of fever and tachycardia on admission.  An NG tube was placed for medication administration.  Patient does not have medical decision making capacity, grand-daughter/ Jessica Booth is her main decision maker, she faces treatment options decisions, advanced directive decisions and anticipatory care needs.  Clinical Assessment and Goals of Care:  This NP Lorinda Creed reviewed medical records, received report from team, assessed the patient and then meet at  the patient's bedside and spoke by phone with Jessica Booth/grand-daughter  to discuss diagnosis, prognosis, GOC, EOL wishes disposition and options.  Concept of Hospice and Palliative Care were discussed  A detailed discussion was had today regarding advanced directives.  Concepts specific to code status, artifical feeding and hydration, continued IV antibiotics and rehospitalization was had.    Family verbalize desire for comfort as main focus of care from start of conversation.  She shares the patient recent history; the loss of her husband and both children and over all failure to thrive, loss of independence and her "being ready"/meaning to die.  "She has no quality of life"  The difference between a aggressive medical intervention path  and a palliative comfort care path for this patient at this time was had.  Values and goals of care important to patient and family were attempted to be elicited.  MOST form discussed will complete in the morning.  Family understand the seriousness/complexity of the medical situation.  The main focus is to allow a natural death while focusing on comfort.  Natural trajectory and expectations at EOL were discussed.  Questions and concerns addressed.   Family encouraged to call with questions or concerns.    PMT will continue to support holistically.  SUMMARY OF RECOMMENDATIONS    Code Status/Advance Care Planning:  DNR   Family request NG tube be removed   Comfort feeds as recommended  by speech/ family understand the risk of aspiration  Medications by mouth as toelrated   Schedule Tylenol for underlying hip pain/patient is unable to request  Re-evaluate in the morning and further clarlify GOC dependant on outcomes over the next 24-48 hours   Additional Recommendations (Limitations, Scope, Preferences):  Avoid Hospitalization, No Artificial Feeding and No IV Fluids  Psycho-social/Spiritual:   Desire for further Chaplaincy  support:no  Additional Recommendations: Education on Hospice  Prognosis:   Unable to determine  Discharge Planning: To Be Determined      Primary Diagnoses: Present on Admission: . Neuromuscular disease or syndrome (HCC)   I have reviewed the medical record, interviewed the patient and family, and examined the patient. The following aspects are pertinent.  Past Medical History:  Diagnosis Date  . Abnormal liver function test   . Coronary artery disease 2004   stent RCA  . Hyperlipidemia   . Hypertension   . Irritable bowel syndrome   . Stroke [facility])    Social History   Socioeconomic History  . Marital status: Married    Spouse name: Not on file  . Number of children: Not on file  . Years of education: Not on file  . Highest education level: Not on file  Occupational History  . Occupation: retired  Engineer, production  . Financial resource strain: Not on file  . Food insecurity:    Worry: Not on file    Inability: Not on file  . Transportation needs:    Medical: Not on file    Non-medical: Not on file  Tobacco Use  . Smoking status: Never Smoker  . Smokeless tobacco: Never Used  Substance and Sexual Activity  . Alcohol use: Yes    Comment: beer rarely  . Drug use: Not on file  . Sexual activity: Not on file  Lifestyle  . Physical activity:    Days per week: Not on file    Minutes per session: Not on file  . Stress: Not on file  Relationships  . Social connections:    Talks on phone: Not on file    Gets together: Not on file    Attends religious service: Not on file    Active member of club or organization: Not on file    Attends meetings of clubs or organizations: Not on file    Relationship status: Not on file  Other Topics Concern  . Not on file  Social History Narrative  . Not on file   Family History  Problem Relation Age of Onset  . Hypertension Sister    Scheduled Meds: . benztropine  1 mg Oral BID  . clopidogrel  75 mg Per NG tube Daily  .  enoxaparin (LOVENOX) injection  30 mg Subcutaneous Q24H  . feeding supplement (ENSURE ENLIVE)  237 mL Per Tube TID  . metoprolol tartrate  25 mg Per Tube BID  . multivitamin with minerals  1 tablet Per Tube Daily  . psyllium  1 packet Per Tube QHS   Continuous Infusions: . sodium chloride     PRN Meds:.acetaminophen **OR** acetaminophen, LORazepam, ondansetron **OR** ondansetron (ZOFRAN) IV Medications Prior to Admission:  Prior to Admission medications   Medication Sig Start Date End Date Taking? Authorizing Provider  acetaminophen (TYLENOL) 500 MG tablet Take 1 tablet (500 mg total) by mouth every 6 (six)  hours as needed for mild pain. 06/06/16  Yes Nadara Mustard, MD  acetaminophen (TYLENOL) 500 MG tablet Take 1,000 mg by mouth 2 (two) times daily.   Yes [provider]  ARIPiprazole (ABILIFY) 2 MG tablet Take 2 mg by mouth daily.   Yes [provider]  buPROPion (WELLBUTRIN XL) 150 MG 24 hr tablet Take 150 mg by mouth every morning.   Yes [provider]  cholestyramine Lanetta Inch) 4 g packet Take 4 g by mouth every Monday, Wednesday, and Friday. Give 1 packet by mouth at bedtime every mon, wed, fri for diarrhea mix with 6oz of fluid   Yes [provider]  clopidogrel (PLAVIX) 75 MG tablet Take 1 tablet (75 mg total) by mouth daily. 03/30/16  Yes Osvaldo Shipper, MD  escitalopram (LEXAPRO) 20 MG tablet Take 20 mg by mouth daily.   Yes [provider]  Fe Cbn-Fe Gluc-FA-B12-C-DSS (FERRALET 90) 90-1 MG TABS Take 1 tablet by mouth daily.   Yes [provider]  feeding supplement, ENSURE ENLIVE, (ENSURE ENLIVE) LIQD Take 237 mLs by mouth 2 (two) times daily between meals. Patient taking differently: Take 237 mLs by mouth daily.  03/30/16  Yes Osvaldo Shipper, MD  levocetirizine (XYZAL) 5 MG tablet Take 5 mg by mouth at bedtime.   Yes [provider]  Menthol, Topical Analgesic, (BIOFREEZE) 4 % GEL Apply 1 application topically every 8  (eight) hours as needed (under left breast as needed for pain).   Yes [provider]  metoprolol tartrate (LOPRESSOR) 25 MG tablet Take 25 mg by mouth 2 (two) times daily.   Yes [provider]  multivitamin-iron-minerals-folic acid (THERAPEUTIC-M) TABS tablet Take 1 tablet by mouth daily.   Yes [provider]  Psyllium 500 MG CAPS Take 500 mg by mouth at bedtime.    Yes [provider]  traMADol (ULTRAM) 50 MG tablet Take 50 mg by mouth every 6 (six) hours as needed for pain. 02/08/14  Yes [provider]  enoxaparin (LOVENOX) 30 MG/0.3ML injection Inject 0.3 mLs (30 mg total) into the skin daily. For 3 weeks. 06/09/16 06/23/16  Zannie Cove, MD  HYDROcodone-acetaminophen (NORCO/VICODIN) 5-325 MG tablet Take 1 tablet by mouth every 6 (six) hours as needed for moderate pain. Patient not taking: Reported on 08/06/2016 06/09/16   Zannie Cove, MD  meclizine (ANTIVERT) 12.5 MG tablet Take 1 tablet (12.5 mg total) by mouth 2 (two) times daily as needed for dizziness. Patient not taking: Reported on 09/25/2017 03/30/16   Osvaldo Shipper, MD   Allergies  Allergen Reactions  . Penicillins Hives and Rash    Has patient had a PCN reaction causing immediate rash, facial/tongue/throat swelling, SOB or lightheadedness with hypotension: Unknown Has patient had a PCN reaction causing severe rash involving mucus membranes or skin necrosis: Unknown Has patient had a PCN reaction that required hospitalization: Unknown Has patient had a PCN reaction occurring within the last 10 years: Unknown If all of the above answers are "NO", then may proceed with Cephalosporin use.    Listed on MAR   Review of Systems  Unable to perform ROS: Mental status change    Physical Exam  Constitutional: She appears lethargic. She appears ill.  Cardiovascular: Normal rate, regular rhythm and normal heart sounds.  Musculoskeletal:  -generalized weakness and muscle atrophy -frail   Neurological: She appears lethargic.    Vital Signs: BP (!) 142/57 (BP Location: Left Arm)   Pulse 63   Temp 99 F (37.2 C) (  Oral)   Resp 17   Wt 57.1 kg   SpO2 97%   BMI 22.30 kg/m  Pain Scale: 0-10   Pain Score: 0-No pain   SpO2: SpO2: 97 % O2 Device:SpO2: 97 % O2 Flow Rate: .   IO: Intake/output summary:   Intake/Output Summary (Last 24 hours) at 10/14/2017 1442 Last data filed at 10/14/2017 0800 Gross per 24 hour  Intake 986.08 ml  Output -  Net 986.08 ml    LBM: Last BM Date: 10/13/17 Baseline Weight: Weight: 57.1 kg Most recent weight: Weight: 57.1 kg     Palliative Assessment/Data:   30%   Discussed with Dr Rito Ehrlich  Time In: 1345 Time Out: 1500 Time Total: 75 minutes Greater than 50%  of this time was spent counseling and coordinating care related to the above assessment and plan.  Signed by: Lorinda Creed, NP   Please contact Palliative Medicine Team phone at [phone] for questions and concerns.  For individual provider: See Loretha Stapler

## 2017-10-14 NOTE — Progress Notes (Signed)
Neurology Progress Note   S:// Patient seen and examined.  More awake today.  Following commands. Granddaughter at bedside provided more history saying that she has an extensive psych history with history of suicidal ideation for many years.  Next line she has also been exhibiting hallucinations and was started on Abilify and August and other medications adjusted as per the prior note also recently. She is also been exhibiting hallucinations over the past few months and has had sleep disturbances as well.  O:// Current vital signs: BP (!) 147/64 (BP Location: Left Arm)   Pulse 62   Temp 98.6 F (37 C) (Axillary)   Resp 16   Wt 57.1 kg   SpO2 95%   BMI 22.30 kg/m  Vital signs in last 24 hours: Temp:  [98.4 F (36.9 C)-99.5 F (37.5 C)] 98.6 F (37 C) (10/08 0348) Pulse Rate:  [62-91] 62 (10/08 0428) Resp:  [15-28] 16 (10/08 0428) BP: (112-176)/(53-93) 147/64 (10/08 0428) SpO2:  [94 %-99 %] 95 % (10/08 0428) Weight:  [57.1 kg] 57.1 kg (10/07 1945) General: Patient is awake, alert and in no distress H ENT: Normocephalic atraumatic History: Chest clear to auscultation CVS: S1-S2 heard regular rhythm Abdomen: Nondistended nontender Neurological exam Patient awake, alert, oriented to self. She is not oriented to place or time. Her speech is mildly dysarthric She has poor attention concentration Follows one-step commands inconsistently. Does not follow multistep commands Comprehension intact, repetition intact, naming, slightly impaired Cranial nerves: Pupils equal round reactive light extraocular movements intact, visual fields full to threat, face symmetric.  Decreased auditory acuity in both ears. Motor exam: He is able to lift both arms antigravity and is able to wiggle both her toes.  She has visible resting tremor worse on the right hand than the left hand.  She has cogwheel rigidity on the right and some paratonia on the left. Sensory exam: Able to appreciate light touch  all over symmetrically. Coordination: No ataxia noted on attempted finger-nose-finger testing Gait testing was deferred at this time  Medications  Current Facility-Administered Medications:  .  acetaminophen (TYLENOL) tablet 650 mg, 650 mg, Oral, Q6H PRN **OR** acetaminophen (TYLENOL) suppository 650 mg, 650 mg, Rectal, Q6H PRN, Kamineni, Neelima, MD .  benztropine (COGENTIN) tablet 1 mg, 1 mg, Oral, BID, Kamineni, Neelima, MD, 1 mg at 10/13/17 2137 .  clopidogrel (PLAVIX) tablet 75 mg, 75 mg, Per NG tube, Daily, Kamineni, Neelima, MD .  enoxaparin (LOVENOX) injection 40 mg, 40 mg, Subcutaneous, Q24H, Kamineni, Neelima, MD, 40 mg at 10/13/17 2135 .  feeding supplement (ENSURE ENLIVE) (ENSURE ENLIVE) liquid 237 mL, 237 mL, Per Tube, TID, Alessandra Bevels, MD, 237 mL at 10/13/17 2143 .  lactated ringers infusion, , Intravenous, Continuous, Kamineni, Neelima, MD, Last Rate: 100 mL/hr at 10/13/17 2021 .  LORazepam (ATIVAN) injection 2 mg, 2 mg, Intravenous, Q4H PRN, Lajuana Ripple, Neelima, MD .  metoprolol tartrate (LOPRESSOR) tablet 25 mg, 25 mg, Per Tube, BID, Kamineni, Neelima, MD, 25 mg at 10/13/17 2137 .  multivitamin with minerals tablet 1 tablet, 1 tablet, Per Tube, Daily, Kamineni, Neelima, MD .  ondansetron (ZOFRAN) tablet 4 mg, 4 mg, Oral, Q6H PRN **OR** ondansetron (ZOFRAN) injection 4 mg, 4 mg, Intravenous, Q6H PRN, Alessandra Bevels, MD .  psyllium (HYDROCIL/METAMUCIL) packet 1 packet, 1 packet, Per Tube, QHS, Alessandra Bevels, MD, 1 packet at 10/13/17 2147 Labs CBC    Component Value Date/Time   WBC 11.5 (H) 10/13/2017 1105   RBC 3.92 10/13/2017 1105   HGB 13.0  10/13/2017 1105   HCT 39.1 10/13/2017 1105   PLT 238 10/13/2017 1105   MCV 99.7 10/13/2017 1105   MCH 33.2 10/13/2017 1105   MCHC 33.2 10/13/2017 1105   RDW 11.9 10/13/2017 1105   LYMPHSABS 2.5 10/13/2017 1105   MONOABS 1.1 (H) 10/13/2017 1105   EOSABS 0.0 10/13/2017 1105   BASOSABS 0.0 10/13/2017 1105    CMP      Component Value Date/Time   NA 138 10/13/2017 1105   K 4.6 10/13/2017 1105   CL 105 10/13/2017 1105   CO2 23 10/13/2017 1105   GLUCOSE 104 (H) 10/13/2017 1105   BUN 27 (H) 10/13/2017 1105   CREATININE 1.23 (H) 10/13/2017 1105   CALCIUM 9.5 10/13/2017 1105   PROT 7.1 10/13/2017 1105   ALBUMIN 4.3 10/13/2017 1105   AST 48 (H) 10/13/2017 1105   ALT 30 10/13/2017 1105   ALKPHOS 45 10/13/2017 1105   BILITOT 1.5 (H) 10/13/2017 1105   GFRNONAA 37 (L) 10/13/2017 1105   GFRAA 42 (L) 10/13/2017 1105    Imaging I have reviewed images in epic and the results pertinent to this consultation are: MRI of the brain shows chronic atrophy and no acute changes.  No acute stroke.  Assessment:  84 year old woman with past medical history of dementia, coronary artery disease, hypertension, history of old stroke with unknown residual deficits with worsening hallucinations and memory over the past few months to years brought in with unwitnessed fall from memory care unit. Exam is suggestive of parkinsonian symptoms and reviewing her medication list she has Abilify on it which is known to cause parkinsonian symptoms.  Abilify was recently started in August and patient has had worsening hallucinations and parkinsonian symptoms since then. 1 of the other considerations is underlying Lewy body dementia given hallucinations, parkinsonian symptoms. Her acute presentation might have been secondary to serotonin syndrome although there was no fevers tachycardia. She had elevated CK indicating rhabdomyolysis which could have also caused some of the altered mental status.  Recommendations: Agree with discontinuing Abilify Can try low-dose benzodiazepines and Cogentin EEG revealed no seizure activity MRI also unremarkable for acute change Low suspicion for seizure. Most likely toxic metabolic encephalopathy from medications. Will recommend psychiatry consult for optimization of her antidepressant  medications. Correct toxic metabolic derangements per primary team as you are. Relayed plan to Dr. Rito Ehrlich in person on the unit. Please call neurology with questions.  -- Milon Dikes, MD [facility] Pager: [phone] If 7pm to 7am, please call on call as listed on [facility].

## 2017-10-15 ENCOUNTER — Ambulatory Visit: Payer: Medicare Other | Admitting: Neurology

## 2017-10-15 LAB — CBC
HCT: 35.9 % — ABNORMAL LOW (ref 36.0–46.0)
HEMOGLOBIN: 11.7 g/dL — AB (ref 12.0–15.0)
MCH: 32.2 pg (ref 26.0–34.0)
MCHC: 32.6 g/dL (ref 30.0–36.0)
MCV: 98.9 fL (ref 80.0–100.0)
Platelets: 194 10*3/uL (ref 150–400)
RBC: 3.63 MIL/uL — AB (ref 3.87–5.11)
RDW: 11.9 % (ref 11.5–15.5)
WBC: 5.7 10*3/uL (ref 4.0–10.5)
nRBC: 0 % (ref 0.0–0.2)

## 2017-10-15 LAB — CK: Total CK: 533 U/L — ABNORMAL HIGH (ref 38–234)

## 2017-10-15 LAB — BASIC METABOLIC PANEL
Anion gap: 5 (ref 5–15)
BUN: 17 mg/dL (ref 8–23)
CALCIUM: 8.7 mg/dL — AB (ref 8.9–10.3)
CO2: 25 mmol/L (ref 22–32)
CREATININE: 1.21 mg/dL — AB (ref 0.44–1.00)
Chloride: 110 mmol/L (ref 98–111)
GFR calc Af Amer: 43 mL/min — ABNORMAL LOW (ref >60)
GFR calc non Af Amer: 37 mL/min — ABNORMAL LOW (ref >60)
GLUCOSE: 94 mg/dL (ref 70–99)
Potassium: 3.8 mmol/L (ref 3.5–5.1)
Sodium: 140 mmol/L (ref 135–145)

## 2017-10-15 MED ORDER — MORPHINE SULFATE (CONCENTRATE) 10 MG/0.5ML PO SOLN
5.0000 mg | Freq: Four times a day (QID) | ORAL | Status: DC
  Administered 2017-10-16: 5 mg via ORAL
  Filled 2017-10-15 (×2): qty 0.5

## 2017-10-15 MED ORDER — MORPHINE SULFATE (CONCENTRATE) 10 MG/0.5ML PO SOLN: 5.0000 mg | ORAL | Status: DC | PRN

## 2017-10-15 NOTE — Progress Notes (Signed)
PT Cancellation Note  Patient Details Name: Jessica Booth MRN: [id number] DOB: 1922/08/10   Cancelled Treatment:    Reason Eval/Treat Not Completed: PT screened, no needs identified, will sign off. Per chart review and RN, pt now on comfort care. PT will sign off. Please re-consult if anything changes. Thank you!   Alessandra Bevels Brandace Cargle 10/15/2017, 12:27 PM

## 2017-10-15 NOTE — Progress Notes (Signed)
[name], female   DOB: 1922/04/24, 84 y.o.   MRN: [id number]  This NP visited patient at the bedside as a follow up to  yesterday's GOCs meeting and to meet with HPOA/[name] to clarify GOCs.  Currently patient is resting and appears comfortable.  Per nursing she had some episodes of agitation through the night that required a single dose of Ativan.  Family clearly understand the seriousness of the current medical situation, high risk for decompensation and likely long-term poor prognosis.  The main focus of care for this patient is comfort and dignity.  Plan of Care -Comfort, quality and dignity -No life prolonging measures/no artificial feeding or hydration now or in the future -Diet as tolerated-accurate I&O to determine true intake -Symptom management to enhance comfort       -Scheduled Roxanol 5 mg po/sl  every 6 hours for underlying pain and prn for dyspnea       -Ativan for agitation -Reassess in the morning for prognostication-depending on outcomes decision will be residential hospice versus back to assisted living with hospice services.  Questions and concerns addressed   Discussed with Dr Ella Jubilee  Total time spent on the unit was 40 minutes  Greater than 50% of the time was spent in counseling and coordination of care  Lorinda Creed NP  Palliative Medicine Team Team Phone # [phone] Pager [phone]

## 2017-10-15 NOTE — Progress Notes (Signed)
PROGRESS NOTE    Jessica Booth  RUE:[id number] DOB: [birth year] DOA: 10/13/2017 PCP: Vivien Presto, MD    Brief Narrative:  84 year old female who presented with altered mentation.  She does have significant past medical history for hypertension, dyslipidemia, history of CVA 2018 with left arm weakness, dementia with hallucinations, coronary artery disease.  Patient was found down on the floor leaning on her back, she did not have her pants on and her shirt was backwards, the refrigerator door was open and milk was spilled over the floor.  He follows up with psychiatry for hallucinations related to her dementia.  On her initial physical examination blood pressure 114/69, heart rate 79, respirate 19, oxygen saturation 94%, she was confused and disoriented, her lungs are clear to auscultation bilaterally, heart S1-S2 present and rhythmic, abdomen soft nontender, no lower extremity edema.   Patient was admitted to the hospital working diagnosis of acute encephalopathy.  Assessment & Plan:   Principal Problem:   Altered mental status Active Problems:   Neuromuscular disease or syndrome (HCC)   Dementia with behavioral disturbance (HCC)   Adult failure to thrive   DNR (do not resuscitate)   Palliative care by specialist   1. Metabolic encephalopathy. Patient with poor prognosis, poor response, will continue supportive medical therapy, pending placement in hospice.    2. Depression and dementia. Poorly reactive today, continue aspiration precautions. Patient will fed for comfort.   3. History of CVA. Poor prognosis. Will discontinue clopidogrel.   4. HTN. Continue metoprolol as tolerated, no thight blood pressure control due to comfort measures.    DVT prophylaxis: scd  Code Status: dnr  Family Communication: I spoke with patient's grandaughter at the bedside and all questions were addressed.  Disposition Plan/ discharge barriers: pending hospice.    Consultants:      Procedures:     Antimicrobials:       Subjective: Patient is somnolent, responds to touch, mumbling words. Not interactive, limited history due to patient's altered mentation.   Objective: Vitals:   10/14/17 2031 10/15/17 0002 10/15/17 0419 10/15/17 0743  BP: (!) 127/97 (!) 127/55 127/81 (!) 156/53  Pulse: 88 65 60   Resp:  18 18 20   Temp:  98.7 F (37.1 C) 98.2 F (36.8 C) 98.6 F (37 C)  TempSrc:  Axillary Axillary Axillary  SpO2:  96% 97% 98%  Weight:        Intake/Output Summary (Last 24 hours) at 10/15/2017 1028 Last data filed at 10/15/2017 0522 Gross per 24 hour  Intake 903.05 ml  Output 2800 ml  Net -1896.95 ml   Filed Weights   10/13/17 1945  Weight: 57.1 kg    Examination:   General: deconditioned  Neurology: somnolent, responds to touch, mumbling sounds.  E ENT: positive pallor, no icterus, oral mucosa is dry.  Cardiovascular: No JVD. S1-S2 present, rhythmic, no gallops, rubs, or murmurs. No lower extremity edema. Pulmonary: positive breath sounds bilaterally, poor inspiratory effort, no wheezing, rhonchi or rales. Gastrointestinal. Abdomen with no organomegaly, non tender, no rebound or guarding Skin. No rashes Musculoskeletal: no joint deformities     Data Reviewed: I have personally reviewed following labs and imaging studies  CBC: Recent Labs  Lab 10/13/17 1105 10/15/17 0632  WBC 11.5* 5.7  NEUTROABS 7.8*  --   HGB 13.0 11.7*  HCT 39.1 35.9*  MCV 99.7 98.9  PLT 238 194   Basic Metabolic Panel: Recent Labs  Lab 10/13/17 1105 10/15/17 0632  NA 138 140  K 4.6 3.8  CL 105 110  CO2 23 25  GLUCOSE 104* 94  BUN 27* 17  CREATININE 1.23* 1.21*  CALCIUM 9.5 8.7*   GFR: CrCl cannot be calculated (Unknown ideal weight.). Liver Function Tests: Recent Labs  Lab 10/13/17 1105  AST 48*  ALT 30  ALKPHOS 45  BILITOT 1.5*  PROT 7.1  ALBUMIN 4.3   No results for input(s): LIPASE, AMYLASE in the last 168 hours. Recent Labs   Lab 10/13/17 1434  AMMONIA 25   Coagulation Profile: No results for input(s): INR, PROTIME in the last 168 hours. Cardiac Enzymes: Recent Labs  Lab 10/13/17 1105 10/14/17 0931 10/15/17 0632  CKTOTAL 501* 761* 533*   BNP (last 3 results) No results for input(s): PROBNP in the last 8760 hours. HbA1C: No results for input(s): HGBA1C in the last 72 hours. CBG: No results for input(s): GLUCAP in the last 168 hours. Lipid Profile: No results for input(s): CHOL, HDL, LDLCALC, TRIG, CHOLHDL, LDLDIRECT in the last 72 hours. Thyroid Function Tests: Recent Labs    10/13/17 1434  TSH 3.189  FREET4 0.88   Anemia Panel: No results for input(s): VITAMINB12, FOLATE, FERRITIN, TIBC, IRON, RETICCTPCT in the last 72 hours.    Radiology Studies: I have reviewed all of the imaging during this hospital visit personally     Scheduled Meds: . clopidogrel  75 mg Oral Daily  . metoprolol tartrate  25 mg Oral BID  . morphine CONCENTRATE  5 mg Oral Q6H   Continuous Infusions:   LOS: 2 days        Mauricio Annett Gula, MD [facility] Pager [phone]

## 2017-10-15 NOTE — Progress Notes (Signed)
OT Cancellation Note  Patient Details Name: Jessica Booth MRN: [id number] DOB: March 01, 1922   Cancelled Treatment:    Reason Eval/Treat Not Completed: OT screened, no needs identified, will sign off. Per chart review and conversations with nurse and case manager, focus is now comfort care. OT signing off.   Raynald Kemp, OT [facility] Pager: [phone] Office: [phone]    10/15/2017, 12:25 PM

## 2017-10-15 NOTE — Progress Notes (Signed)
RN has attempted to provide mouth care and provide water to pt. PT gets angry and begins hitting staff with first. Pt has also attempted to spit when attempting to provide mouth care. Pt is also refusing to keep her gown on; and removes it as soon as staff gets her dressed again.

## 2017-10-16 ENCOUNTER — Other Ambulatory Visit: Payer: Self-pay

## 2017-10-16 DIAGNOSIS — R52 Pain, unspecified: Secondary | ICD-10-CM

## 2017-10-16 MED ORDER — BENZTROPINE MESYLATE 0.5 MG PO TABS
1.0000 mg | ORAL_TABLET | Freq: Two times a day (BID) | ORAL | Status: DC
  Administered 2017-10-16 (×2): 1 mg via ORAL
  Filled 2017-10-16 (×3): qty 2

## 2017-10-16 MED ORDER — MORPHINE SULFATE (PF) 2 MG/ML IV SOLN
2.0000 mg | Freq: Four times a day (QID) | INTRAVENOUS | Status: DC
  Administered 2017-10-16 – 2017-10-17 (×5): 2 mg via INTRAVENOUS
  Filled 2017-10-16 (×5): qty 1

## 2017-10-16 MED ORDER — MORPHINE SULFATE (PF) 2 MG/ML IV SOLN: 1.0000 mg | INTRAVENOUS | Status: DC | PRN

## 2017-10-16 MED ORDER — BENZTROPINE MESYLATE 1 MG PO TABS: 1.0000 mg | ORAL_TABLET | Freq: Two times a day (BID) | ORAL | 0 refills | Status: AC

## 2017-10-16 NOTE — Progress Notes (Signed)
[name], female   DOB: July 11, 1922, 84 y.o.   MRN: [id number]  This NP visited patient at the bedside as a follow up to  yesterday's GOCs meeting as scheduled with granddaughter/healthcare power of attorney/[name] to continue conversation regarding clarification of goals of care.  Patient is currently resting comfortably and arouses to gentle stimulation.  Even with gentle stimulation any movement or repositioning the patient grimaces and moans. She has had episodes of agitation and combativeness in the last 24 hours and has basically had no p.o. Intake.  Focus of care remains comfort, quality and dignity.  Prognosis is likely less than 2 weeks.  Discussed with granddaughter and nursing strategies to manage symptoms. -Schedule Morphine IV 2 mg every 6 hours and utilize morphine IV 1 mg every 1 hour as needed for pain or shortness of breath -Ativan 2 mg IV every 4 hours as needed for agitation -Slow gentle movements when approaching patient   Family is hopeful for residential hospice at [facility] for end-of-life care.  They have been been receiving immunity based palliative care through [facility] [place].  Discussed with family the importance of continued conversation with the  medical providers regarding overall plan of care and treatment options,  ensuring decisions are within the context of the patients values and GOCs.  Questions and concerns addressed   Discussed with Dr Ella Jubilee and Liz/LCSW  Total time spent on the unit was 45 minutes  Greater than 50% of the time was spent in counseling and coordination of care  Lorinda Creed NP  Palliative Medicine Team Team Phone # [phone] Pager [phone]

## 2017-10-16 NOTE — Discharge Summary (Addendum)
Physician Discharge Summary  Jessica Booth ZOX:[id number] DOB: [birth year] DOA: 10/13/2017  PCP: Vivien Presto, MD  Admit date: 10/13/2017 Discharge date: 10/17/2017  Admitted From: Home  Disposition:  Hospice   Recommendations for Outpatient Follow-up and new medication changes:  1. Follow up with Dr. Salvadore Farber in 7 days 2. Follow up with hospice services.  3. Discontinue aripiprazole, bupropion, escitalopram.   Home Health: na   Equipment/Devices: na    Discharge Condition: stable  CODE STATUS: DNR   Diet recommendation: mechanical soft with thin liquids. (Aspiration precautions)  Brief/Interim Summary: 84 year old female who presented with altered mentation.  She does have significant past medical history for hypertension, dyslipidemia, history of CVA 2018 with left arm weakness, dementia with hallucinations and coronary artery disease.  Patient was found down on the floor leaning on her back, she did not have her pants on and her shirt was backwards, the refrigerator door was open and milk was spilled over the floor. She follows up with psychiatry for hallucinations related to her dementia.  On her initial physical examination blood pressure 114/69, heart rate 79, respiratory rate 19, oxygen saturation 94%, she was confused and disoriented, her lungs were clear to auscultation bilaterally, heart S1-S2 present and rhythmic, abdomen soft nontender, no lower extremity edema.   Sodium 138, potassium 4.6, chloride 105, bicarb 23, glucose 104, BUN 27, creatinine 1.23, AST 48, ALT 30, CK 501, white cell count 11.5, hemoglobin 13.0, hematocrit 39.1, platelets 238, urinalysis 0-5 white cells, 0-5 red cells, specific gravity 1.019, 30 protein.  Head CT with soft tissue swelling occipital region without underlying fracture or intracranial hemorrhage.  Global atrophy.  Spine 3 mm anterior slippage of C3 new from prior examination.  Likely related to facet degenerative changes.  Her chest x-ray was  negative for infiltrates.  EKG with normal sinus rhythm, low voltage.  Patient was admitted to the hospital with the working diagnosis of acute encephalopathy.   1.  Metabolic/ toxic encephalopathy.  Patient was admitted to the medical ward, had frequent neuro checks, and her psychotropic agents were discontinued.  Further work-up with brain MRI showed diffuse severe atrophy of the brain and chronic ischemic microangiopathy without acute infarct.  Electroencephalography head no focal lateralizing or epileptiform features.  Patient had a very poor oral intake, required NG tube for feedings.  Eventually NG tube was removed and patient was allowed to eat, speech evaluation recommended mechanical soft diet, thin liquids, aspiration precautions.  2.  Dementia with hallucinations.  Patient had a progressive decline in her cognitive function, patient was placed on cogentin per psychiatric recommendations, all other psychotropic agents were held.  Patient does have a poor prognosis, palliative care team was consulted and patient's family decided to continue care at hospice.  3.  History of CVA.  Patient with poor prognosis, low life expectancy, will discontinue statins and clopidogrel.  Her echocardiogram showed normal LV systolic function.  4.  Hypertension.  Continue metoprolol for blood pressure control.  Discharge Diagnoses:  Principal Problem:   Altered mental status Active Problems:   Neuromuscular disease or syndrome (HCC)   Dementia with behavioral disturbance (HCC)   Adult failure to thrive   DNR (do not resuscitate)   Palliative care by specialist   Pain, generalized    Discharge Instructions   Allergies as of 10/16/2017      Reactions   Penicillins Hives, Rash   Has patient had a PCN reaction causing immediate rash, facial/tongue/throat swelling, SOB or lightheadedness with hypotension: Unknown  Has patient had a PCN reaction causing severe rash involving mucus membranes or skin  necrosis: Unknown Has patient had a PCN reaction that required hospitalization: Unknown Has patient had a PCN reaction occurring within the last 10 years: Unknown If all of the above answers are "NO", then may proceed with Cephalosporin use. Listed on [facility]      Medication List    STOP taking these medications   ARIPiprazole 2 MG tablet Commonly known as:  ABILIFY   buPROPion 150 MG 24 hr tablet Commonly known as:  WELLBUTRIN XL   cholestyramine 4 g packet Commonly known as:  QUESTRAN   clopidogrel 75 MG tablet Commonly known as:  PLAVIX   enoxaparin 30 MG/0.3ML injection Commonly known as:  LOVENOX   escitalopram 20 MG tablet Commonly known as:  LEXAPRO   FERRALET 90 90-1 MG Tabs   HYDROcodone-acetaminophen 5-325 MG tablet Commonly known as:  NORCO/VICODIN   levocetirizine 5 MG tablet Commonly known as:  XYZAL   meclizine 12.5 MG tablet Commonly known as:  ANTIVERT   metoprolol tartrate 25 MG tablet Commonly known as:  LOPRESSOR   Psyllium 500 MG Caps   traMADol 50 MG tablet Commonly known as:  ULTRAM     TAKE these medications   acetaminophen 500 MG tablet Commonly known as:  TYLENOL Take 1 tablet (500 mg total) by mouth every 6 (six) hours as needed for mild pain. What changed:  Another medication with the same name was removed. Continue taking this medication, and follow the directions you see here.   benztropine 1 MG tablet Commonly known as:  COGENTIN Take 1 tablet (1 mg total) by mouth 2 (two) times daily.   BIOFREEZE 4 % Gel Generic drug:  Menthol (Topical Analgesic) Apply 1 application topically every 8 (eight) hours as needed (under left breast as needed for pain).   feeding supplement (ENSURE ENLIVE) Liqd Take 237 mLs by mouth 2 (two) times daily between meals. What changed:  when to take this   multivitamin-iron-minerals-folic acid Tabs tablet Take 1 tablet by mouth daily.       Allergies  Allergen Reactions  . Penicillins Hives and  Rash    Has patient had a PCN reaction causing immediate rash, facial/tongue/throat swelling, SOB or lightheadedness with hypotension: Unknown Has patient had a PCN reaction causing severe rash involving mucus membranes or skin necrosis: Unknown Has patient had a PCN reaction that required hospitalization: Unknown Has patient had a PCN reaction occurring within the last 10 years: Unknown If all of the above answers are "NO", then may proceed with Cephalosporin use.    Listed on [facility]    Consultations:  Neurology   Psychiatry   Palliative Care   Procedures/Studies: Dg Chest 2 View  Result Date: 10/13/2017 CLINICAL DATA:  Altered mental status. EXAM: CHEST - 2 VIEW COMPARISON:  Chest x-ray dated Jun 03, 2016. FINDINGS: The heart size and mediastinal contours are within normal limits. Normal pulmonary vascularity. Atherosclerotic calcification of the aortic arch. No focal consolidation, pleural effusion, or pneumothorax. Unchanged elevation of the right hemidiaphragm. No acute osseous abnormality. IMPRESSION: No active cardiopulmonary disease. Electronically Signed   By: Obie Dredge M.D.   On: 10/13/2017 12:13   Ct Head Wo Contrast  Result Date: 10/13/2017 CLINICAL DATA:  84 year old female post fall. On blood thinner. Does not remember fall. Neck pain. Initial encounter. EXAM: CT HEAD WITHOUT CONTRAST CT CERVICAL SPINE WITHOUT CONTRAST TECHNIQUE: Multidetector CT imaging of the head and cervical spine was performed  following the standard protocol without intravenous contrast. Multiplanar CT image reconstructions of the cervical spine were also generated. COMPARISON:  06/03/2016 CT. FINDINGS: CT HEAD FINDINGS Brain: No intracranial hemorrhage or CT evidence of large acute infarct. Prominent chronic microvascular changes. Global atrophy. No intracranial mass lesion noted on this unenhanced exam. Vascular: Vascular calcifications.  No hyperdense vessel Skull: No skull fracture  Sinuses/Orbits: Post lens replacement. No acute orbital abnormality. Minimal polypoid opacification right maxillary sinus and minimal mucosal thickening left maxillary sinus. Minimal partial opacification inferior left mastoid air cells. No obstructing lesion of eustachian tube noted. Other: Soft tissue swelling/hematoma occipital region greater to the left of midline. CT CERVICAL SPINE FINDINGS Alignment: 3 mm anterior slip C3 new from prior examination. This probably is related to facet degenerative changes which have progressed on the left when compared to prior exam. Mild curvature cervical spine convex right. Skull base and vertebrae: No cervical spine fracture. Soft tissues and spinal canal: No abnormal prevertebral soft tissue swelling. Disc levels: Cervical spondylotic changes with spinal stenosis and cord flattening C3-4 through C5-6. Bilateral foraminal narrowing at these levels. Upper chest: Scarring lung apices greater on right. Other: Bilateral carotid bifurcation calcifications. IMPRESSION: CT HEAD 1. Soft tissue swelling/hematoma occipital region without underlying fracture or intracranial hemorrhage. 2. Prominent chronic microvascular changes. Global atrophy. 3. Minimal polypoid opacification right maxillary sinus and minimal mucosal thickening left maxillary sinus. Minimal partial opacification inferior left mastoid air cells. No obstructing lesion of eustachian tube noted. CT CERVICAL SPINE 1. 3 mm anterior slip C3 new from prior examination. This probably is related to facet degenerative changes which have progressed on the left when compared to prior exam. 2. No cervical spine fracture or abnormal prevertebral soft tissue swelling. 3. Cervical spondylotic changes with spinal stenosis and cord flattening C3-4 through C5-6. Bilateral foraminal narrowing at these levels. Electronically Signed   By: Lacy Duverney M.D.   On: 10/13/2017 10:13   Ct Cervical Spine Wo Contrast  Result Date:  10/13/2017 CLINICAL DATA:  84 year old female post fall. On blood thinner. Does not remember fall. Neck pain. Initial encounter. EXAM: CT HEAD WITHOUT CONTRAST CT CERVICAL SPINE WITHOUT CONTRAST TECHNIQUE: Multidetector CT imaging of the head and cervical spine was performed following the standard protocol without intravenous contrast. Multiplanar CT image reconstructions of the cervical spine were also generated. COMPARISON:  06/03/2016 CT. FINDINGS: CT HEAD FINDINGS Brain: No intracranial hemorrhage or CT evidence of large acute infarct. Prominent chronic microvascular changes. Global atrophy. No intracranial mass lesion noted on this unenhanced exam. Vascular: Vascular calcifications.  No hyperdense vessel Skull: No skull fracture Sinuses/Orbits: Post lens replacement. No acute orbital abnormality. Minimal polypoid opacification right maxillary sinus and minimal mucosal thickening left maxillary sinus. Minimal partial opacification inferior left mastoid air cells. No obstructing lesion of eustachian tube noted. Other: Soft tissue swelling/hematoma occipital region greater to the left of midline. CT CERVICAL SPINE FINDINGS Alignment: 3 mm anterior slip C3 new from prior examination. This probably is related to facet degenerative changes which have progressed on the left when compared to prior exam. Mild curvature cervical spine convex right. Skull base and vertebrae: No cervical spine fracture. Soft tissues and spinal canal: No abnormal prevertebral soft tissue swelling. Disc levels: Cervical spondylotic changes with spinal stenosis and cord flattening C3-4 through C5-6. Bilateral foraminal narrowing at these levels. Upper chest: Scarring lung apices greater on right. Other: Bilateral carotid bifurcation calcifications. IMPRESSION: CT HEAD 1. Soft tissue swelling/hematoma occipital region without underlying fracture or intracranial hemorrhage. 2.  Prominent chronic microvascular changes. Global atrophy. 3. Minimal  polypoid opacification right maxillary sinus and minimal mucosal thickening left maxillary sinus. Minimal partial opacification inferior left mastoid air cells. No obstructing lesion of eustachian tube noted. CT CERVICAL SPINE 1. 3 mm anterior slip C3 new from prior examination. This probably is related to facet degenerative changes which have progressed on the left when compared to prior exam. 2. No cervical spine fracture or abnormal prevertebral soft tissue swelling. 3. Cervical spondylotic changes with spinal stenosis and cord flattening C3-4 through C5-6. Bilateral foraminal narrowing at these levels. Electronically Signed   By: Lacy Duverney M.D.   On: 10/13/2017 10:13   Mr Brain Wo Contrast  Result Date: 10/14/2017 CLINICAL DATA:  Altered mental status. EXAM: MRI HEAD WITHOUT CONTRAST TECHNIQUE: Multiplanar, multiecho pulse sequences of the brain and surrounding structures were obtained without intravenous contrast. COMPARISON:  Head CT 10/13/2017 FINDINGS: BRAIN: There is no acute infarct, acute hemorrhage, hydrocephalus or extra-axial collection. The midline structures are normal. No midline shift or other mass effect. Diffuse confluent hyperintense T2-weighted signal within the periventricular, deep and juxtacortical white matter, most commonly due to chronic ischemic microangiopathy. There is diffuse, severe atrophy. Susceptibility-sensitive sequences show no chronic microhemorrhage or superficial siderosis. VASCULAR: Major intracranial arterial and venous sinus flow voids are normal. SKULL AND UPPER CERVICAL SPINE: Calvarial bone marrow signal is normal. There is no skull base mass. Visualized upper cervical spine and soft tissues are normal. SINUSES/ORBITS: No fluid levels or advanced mucosal thickening. No mastoid or middle ear effusion. There are bilateral lens replacements. IMPRESSION: Diffuse, severe atrophy of the brain and chronic ischemic microangiopathy without acute infarct. Electronically  Signed   By: Deatra Robinson M.D.   On: 10/14/2017 01:51   Dg Abd Portable 1 View  Result Date: 10/13/2017 CLINICAL DATA:  Nasogastric tube placement. EXAM: PORTABLE ABDOMEN - 1 VIEW COMPARISON:  Chest radiographs obtained earlier today. FINDINGS: Interval nasogastric tube with its tip and side hole in the stomach. The included bowel gas pattern is normal. Cholecystectomy clips. Normal sized heart. Clear included lungs. Moderate dextroconvex lumbar scoliosis. Mild thoracolumbar spine degenerative changes. IMPRESSION: Nasogastric tube tip and side hole in the stomach. No acute abnormality. Electronically Signed   By: Beckie Salts M.D.   On: 10/13/2017 17:32       Subjective: Patient has been poorly reactive, no apparent pain or dyspnea, no vomiting. Limited history due to patient's cognitive impairment.   Discharge Exam: Vitals:   10/15/17 1958 10/16/17 0757  BP: 101/84 (!) 153/71  Pulse: 66 79  Resp: 16 15  Temp: 98.5 F (36.9 C) (!) 97.5 F (36.4 C)  SpO2: 100% 92%   Vitals:   10/15/17 0419 10/15/17 0743 10/15/17 1958 10/16/17 0757  BP: 127/81 (!) 156/53 101/84 (!) 153/71  Pulse: 60  66 79  Resp: 18 20 16 15   Temp: 98.2 F (36.8 C) 98.6 F (37 C) 98.5 F (36.9 C) (!) 97.5 F (36.4 C)  TempSrc: Axillary Axillary Oral Oral  SpO2: 97% 98% 100% 92%  Weight:        General: Not in pain or dyspnea. Deconditioned  Neurology: Poorly reactive.  E ENT: no pallor, no icterus, oral mucosa moist Cardiovascular: No JVD. S1-S2 present, rhythmic, no gallops, rubs, or murmurs. No lower extremity edema. Pulmonary: positive breath sounds bilaterally, adequate air movement, no wheezing, rhonchi or rales. Gastrointestinal. Abdomen with no organomegaly, non tender, no rebound or guarding Skin. No rashes Musculoskeletal: no joint deformities   The  results of significant diagnostics from this hospitalization (including imaging, microbiology, ancillary and laboratory) are listed below for  reference.     Microbiology: No results found for this or any previous visit (from the past 240 hour(s)).   Labs: BNP (last 3 results) No results for input(s): BNP in the last 8760 hours. Basic Metabolic Panel: Recent Labs  Lab 10/13/17 1105 10/15/17 0632  NA 138 140  K 4.6 3.8  CL 105 110  CO2 23 25  GLUCOSE 104* 94  BUN 27* 17  CREATININE 1.23* 1.21*  CALCIUM 9.5 8.7*   Liver Function Tests: Recent Labs  Lab 10/13/17 1105  AST 48*  ALT 30  ALKPHOS 45  BILITOT 1.5*  PROT 7.1  ALBUMIN 4.3   No results for input(s): LIPASE, AMYLASE in the last 168 hours. Recent Labs  Lab 10/13/17 1434  AMMONIA 25   CBC: Recent Labs  Lab 10/13/17 1105 10/15/17 0632  WBC 11.5* 5.7  NEUTROABS 7.8*  --   HGB 13.0 11.7*  HCT 39.1 35.9*  MCV 99.7 98.9  PLT 238 194   Cardiac Enzymes: Recent Labs  Lab 10/13/17 1105 10/14/17 0931 10/15/17 0632  CKTOTAL 501* 761* 533*   BNP: Invalid input(s): POCBNP CBG: No results for input(s): GLUCAP in the last 168 hours. D-Dimer No results for input(s): DDIMER in the last 72 hours. Hgb A1c No results for input(s): HGBA1C in the last 72 hours. Lipid Profile No results for input(s): CHOL, HDL, LDLCALC, TRIG, CHOLHDL, LDLDIRECT in the last 72 hours. Thyroid function studies Recent Labs    10/13/17 1434  TSH 3.189   Anemia work up No results for input(s): VITAMINB12, FOLATE, FERRITIN, TIBC, IRON, RETICCTPCT in the last 72 hours. Urinalysis    Component Value Date/Time   COLORURINE YELLOW 10/13/2017 1324   APPEARANCEUR CLEAR 10/13/2017 1324   LABSPEC 1.019 10/13/2017 1324   PHURINE 5.0 10/13/2017 1324   GLUCOSEU NEGATIVE 10/13/2017 1324   HGBUR NEGATIVE 10/13/2017 1324   BILIRUBINUR NEGATIVE 10/13/2017 1324   KETONESUR NEGATIVE 10/13/2017 1324   PROTEINUR 30 (A) 10/13/2017 1324   UROBILINOGEN 0.2 03/21/2014 2257   NITRITE NEGATIVE 10/13/2017 1324   LEUKOCYTESUR NEGATIVE 10/13/2017 1324   Sepsis Labs Invalid input(s):  PROCALCITONIN,  WBC,  LACTICIDVEN Microbiology No results found for this or any previous visit (from the past 240 hour(s)).   Time coordinating discharge: 45 minutes  SIGNED:   Coralie Keens, MD  [facility] 10/16/2017, 11:58 AM Pager [phone]  If 7PM-7AM, please contact night-coverage [email or web address] Password [email or web address]

## 2017-10-16 NOTE — Progress Notes (Signed)
CSW contacted by PMT NP about patient's family requesting placement at [facility]. CSW contacted [employer] RN to present referral. [facility] has no beds available today, per Admissions, but will review referral.  CSW to follow.  Blenda Nicely, [place] Clinical Social Worker [phone]

## 2017-10-16 NOTE — Progress Notes (Signed)
[facility])    Received request from Marisue Ivan CSW for family interest in [facility].  Chart reviewed and left message with family to acknowledge referral.  Unfortunately, [facility] does not have a bed to offer today.  Pt is on our palliative services and known to [facility].  CSW made aware, and I will update tomorrow or when a bed becomes available.  Please do not hesitate to call.  [facility] will continue to follow  Thank you, Wallis Bamberg BSN, RN  [facility] Liaison (listed in [place]) [phone]

## 2017-10-16 NOTE — Progress Notes (Signed)
[facility])  Pt is eligible for [facility], and there will be a bed for her on 10/11.  Updated CSW, as well as granddaughter Marylene Land.  Plans to meet [name] at 0930 to sign necessary paperwork for [facility].  Will update CSW when paperwork is complete so transportation can be arranged.  Thank you, Wallis Bamberg BSN, RN [facility] Liaison (listed in [place]) [phone]

## 2017-10-16 NOTE — Care Management Important Message (Signed)
Important Message  Patient Details  Name: Jessica Booth MRN: [id number] Date of Birth: May 02, 1922   Medicare Important Message Given:  Yes    [name] 10/16/2017, 4:36 PM

## 2017-10-17 NOTE — Progress Notes (Signed)
Hospice and [facility]) [place] Registration Visit  Met with Levada Dy (granddaughter), answered questions related to [facility] and hospice philosophy.  Necessary paperwork completed.  Family agreeable to transfer today.  CSW made aware.  Dr. Orpah Melter to assume care per family request.    Please fax d/c summary to:  [phone].  Nursing staff:  Please call report to [phone]  Thank you, Venia Carbon BSN, RN [facility] Liaison (listed in [place]) [phone]

## 2017-10-17 NOTE — Progress Notes (Signed)
NURSING PROGRESS NOTE  Jessica Booth [id number] Discharge Data: 10/17/2017 12:55 PM Attending Provider: Coralie Keens,* OZD:[id number], Meredith Mody, MD     Lucia Gaskins to be D/C'd Skilled nursing facility per MD order.  Discussed with the patient the After Visit Summary and all questions fully answered. All IV's discontinued with no bleeding noted. All belongings returned to patient for patient to take home.   Last Vital Signs:  Blood pressure (!) 150/141, pulse 93, temperature 98 F (36.7 C), resp. rate 17, weight 57.1 kg, SpO2 97 %.  Discharge Medication List Allergies as of 10/17/2017      Reactions   Penicillins Hives, Rash   Has patient had a PCN reaction causing immediate rash, facial/tongue/throat swelling, SOB or lightheadedness with hypotension: Unknown Has patient had a PCN reaction causing severe rash involving mucus membranes or skin necrosis: Unknown Has patient had a PCN reaction that required hospitalization: Unknown Has patient had a PCN reaction occurring within the last 10 years: Unknown If all of the above answers are "NO", then may proceed with Cephalosporin use. Listed on [facility]      Medication List    STOP taking these medications   ARIPiprazole 2 MG tablet Commonly known as:  ABILIFY   buPROPion 150 MG 24 hr tablet Commonly known as:  WELLBUTRIN XL   cholestyramine 4 g packet Commonly known as:  QUESTRAN   clopidogrel 75 MG tablet Commonly known as:  PLAVIX   enoxaparin 30 MG/0.3ML injection Commonly known as:  LOVENOX   escitalopram 20 MG tablet Commonly known as:  LEXAPRO   FERRALET 90 90-1 MG Tabs   HYDROcodone-acetaminophen 5-325 MG tablet Commonly known as:  NORCO/VICODIN   levocetirizine 5 MG tablet Commonly known as:  XYZAL   meclizine 12.5 MG tablet Commonly known as:  ANTIVERT   metoprolol tartrate 25 MG tablet Commonly known as:  LOPRESSOR   Psyllium 500 MG Caps   traMADol 50 MG tablet Commonly known as:  ULTRAM      TAKE these medications   acetaminophen 500 MG tablet Commonly known as:  TYLENOL Take 1 tablet (500 mg total) by mouth every 6 (six) hours as needed for mild pain. What changed:  Another medication with the same name was removed. Continue taking this medication, and follow the directions you see here.   benztropine 1 MG tablet Commonly known as:  COGENTIN Take 1 tablet (1 mg total) by mouth 2 (two) times daily.   BIOFREEZE 4 % Gel Generic drug:  Menthol (Topical Analgesic) Apply 1 application topically every 8 (eight) hours as needed (under left breast as needed for pain).   feeding supplement (ENSURE ENLIVE) Liqd Take 237 mLs by mouth 2 (two) times daily between meals. What changed:  when to take this   multivitamin-iron-minerals-folic acid Tabs tablet Take 1 tablet by mouth daily.

## 2017-10-17 NOTE — Progress Notes (Signed)
Patient is medically stable to be transfer to hospice.

## 2017-10-17 NOTE — Clinical Social Work Note (Signed)
Clinical Social Work Assessment  Patient Details  Name: Jessica Booth MRN: [id number] Date of Birth: 05-05-1922  Date of referral:  10/17/17               Reason for consult:  Facility Placement, End of Life/Hospice                Permission sought to share information with:  Facility Medical sales representative, Family Supports Permission granted to share information::  Yes, Verbal Permission Granted  Name::     Financial controller::  [employer]  Relationship::  Daughter  Contact Information:     Housing/Transportation Living arrangements for the past 2 months:  Assisted Dealer of Information:  Medical Team, Other (Comment Required) Patient Interpreter Needed:  None Criminal Activity/Legal Involvement Pertinent to Current Situation/Hospitalization:  No - Comment as needed Significant Relationships:  Other Family Members Lives with:  Self, Facility Resident Do you feel safe going back to the place where you live?  Yes Need for family participation in patient care:  Yes (Comment)  Care giving concerns:  Patient at end of life and needs residential hospice placement.   Social Worker assessment / plan:  CSW was alerted yesterday from PMT NP that patient's family interested in [facility]. CSW sent referral, followed up today. Patient accepted at [facility] with bed available today. CSW confirmed with team that patient transport could be set up and let granddaughter know.  Employment status:  Retired Health and safety inspector:  Medicare PT Recommendations:  Not assessed at this time Information / Referral to community resources:     Patient/Family's Response to care:  Patient's granddaughter agreeable to residential hospice placement.  Patient/Family's Understanding of and Emotional Response to Diagnosis, Current Treatment, and Prognosis:  Patient's granddaughter is aware that patient is at end of life and is at peace with the decision, due to patient's quality of life not  being well the past few months. Patient's granddaughter appreciative of assistance in coordinating care.  Emotional Assessment Appearance:  Appears stated age Attitude/Demeanor/Rapport:  Unable to Assess Affect (typically observed):  Unable to Assess Orientation:  Oriented to Self Alcohol / Substance use:  Not Applicable Psych involvement (Current and /or in the community):  No (Comment)  Discharge Needs  Concerns to be addressed:  Care Coordination Readmission within the last 30 days:  No Current discharge risk:  Terminally ill Barriers to Discharge:  No Barriers Identified   Baldemar Lenis, LCSW 10/17/2017, 12:19 PM

## 2017-10-17 NOTE — Progress Notes (Signed)
Discharge to: [facility] Anticipated discharge date: 10/17/17 Family notified: Yes, by phone Transportation by: [facility]  Report #: [phone]  CSW signing off.  Blenda Nicely LCSW [phone]

## 2017-11-07 DEATH — deceased

## 2019-04-17 IMAGING — CT CT HEAD W/O CM
4 of 7 series · 15 of 47 positions shown, 18 images · non-contrast
Comparison: Head CT dated 03/27/2016 and MRI angiography dated
03/28/2016

CLINICAL DATA: [AGE] female with fall and laceration of the
scalp.

EXAM:
CT HEAD WITHOUT CONTRAST
CT CERVICAL SPINE WITHOUT CONTRAST
TECHNIQUE: Multidetector CT imaging of the head and cervical spine was
performed following the standard protocol without intravenous
contrast. Multiplanar CT image reconstructions of the cervical spine
were also generated.

[Series 3: head 5.0 h30s · axial · 0.44mm/px · z∈[-643,-603]mm · 2 of 31 slices shown]
[im 8/31  brain]
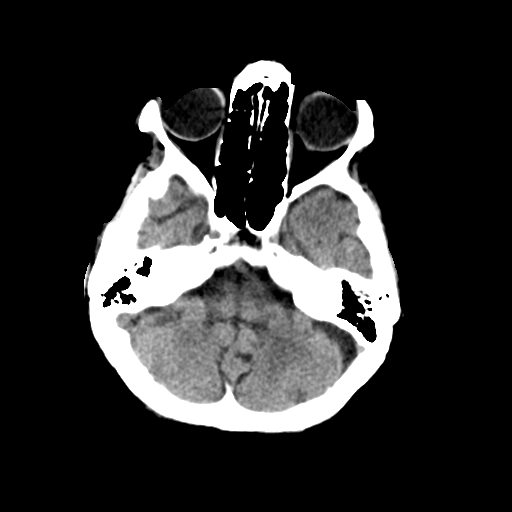
[im 16/31  brain]
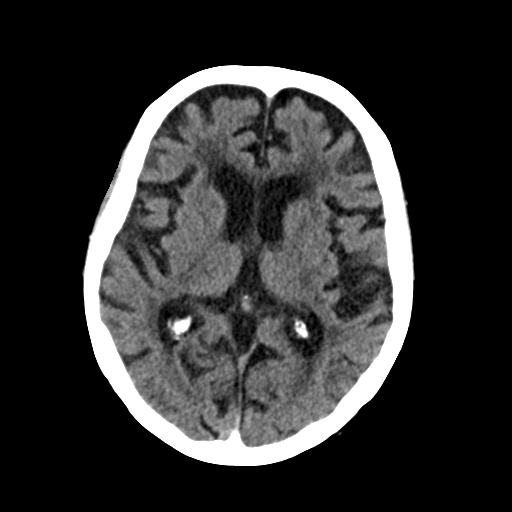

[Series 5: head 3.0 mpr cor · coronal · 0.30mm/px · 2 of 70 slices shown]
[im 13/70  brain]
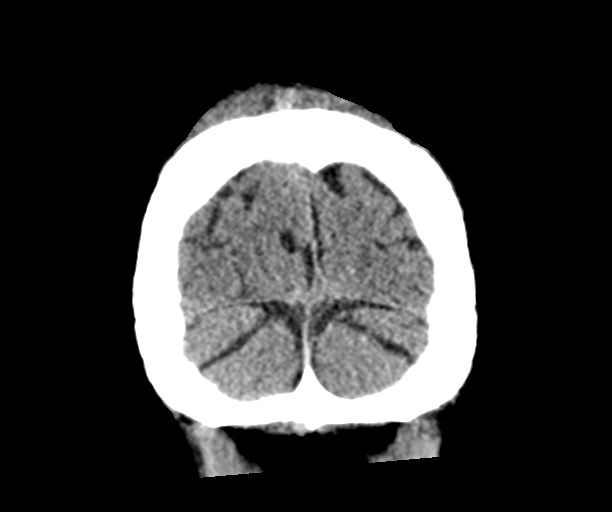
[im 26/70  brain]
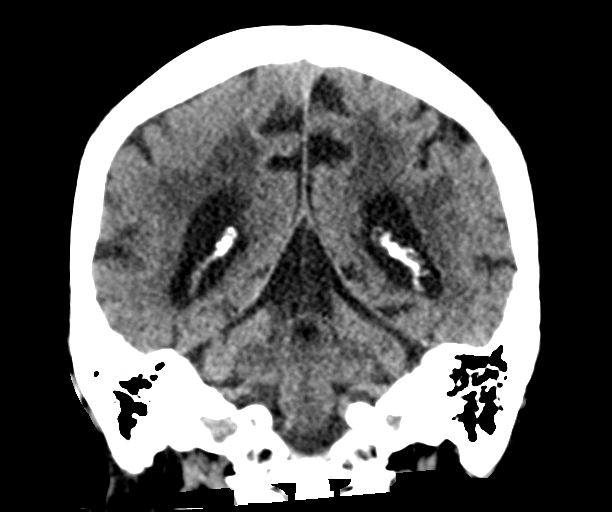

[Series 6: head 3.0 mpr sag · sagittal · 0.30mm/px · 1 of 56 slices shown]
[im 28/56  brain]
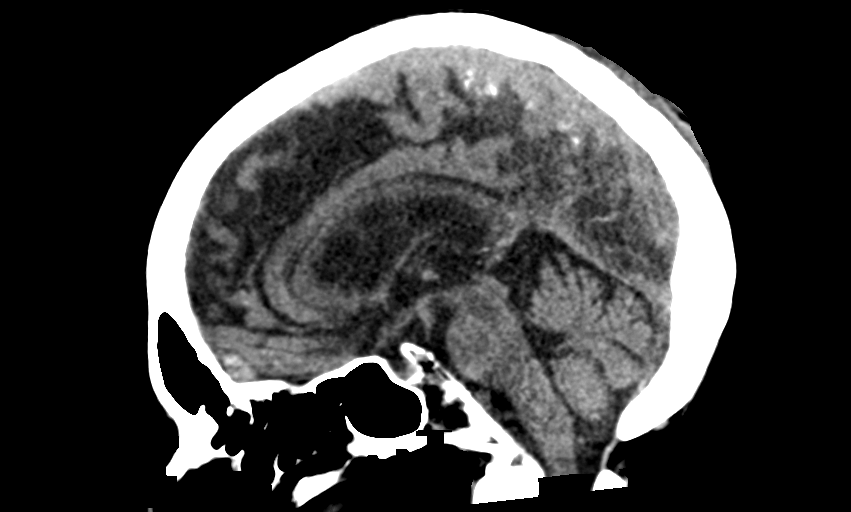

[Series 13: orthogonal axial st · axial · 0.21mm/px · z∈[-820,-688]mm · 10 of 82 slices shown, 13 images]
[im 8/82  brain]
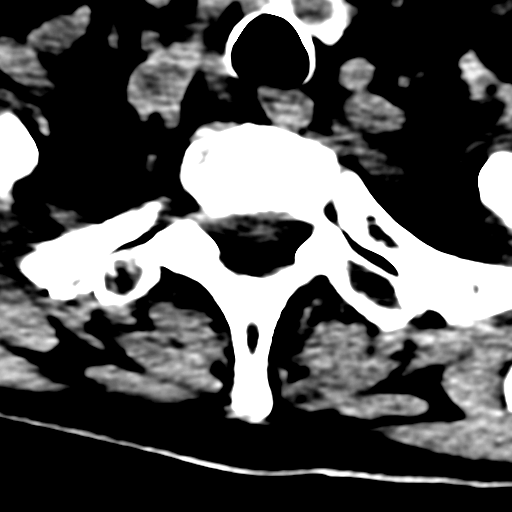
[im 8/82  bone]
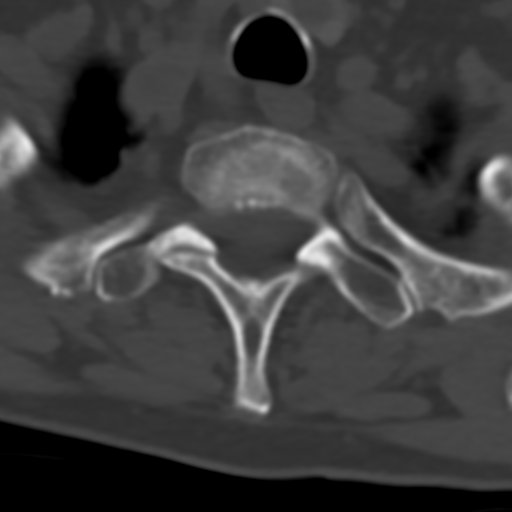
[im 15/82  brain]
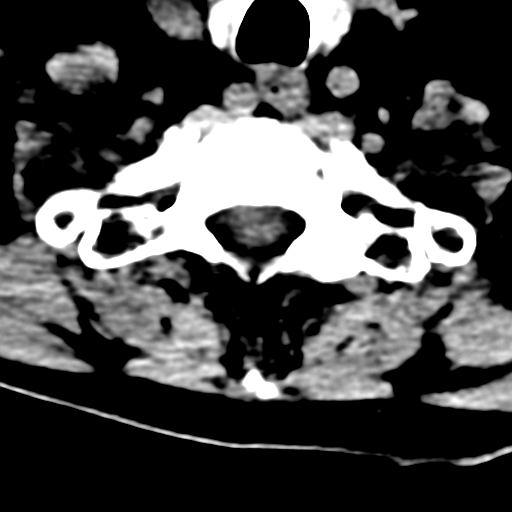
[im 23/82  brain]
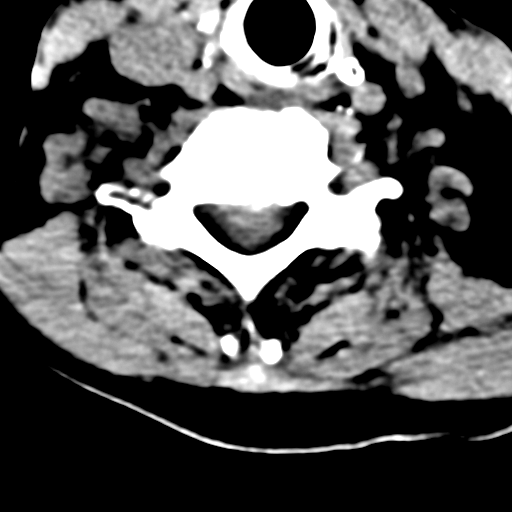
[im 30/82  brain]
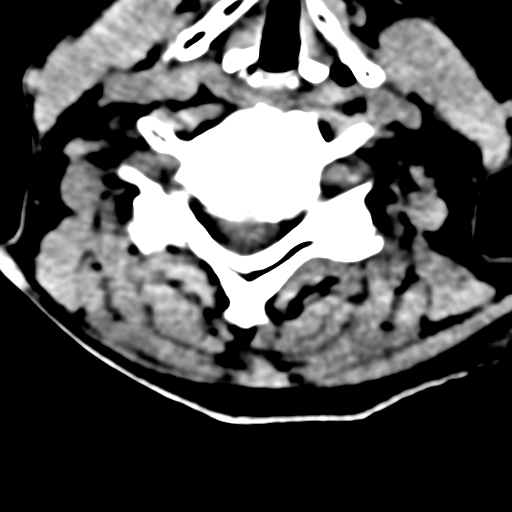
[im 37/82  brain]
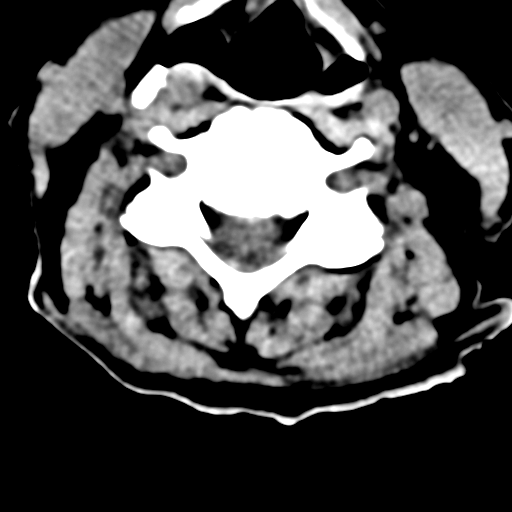
[im 37/82  bone]
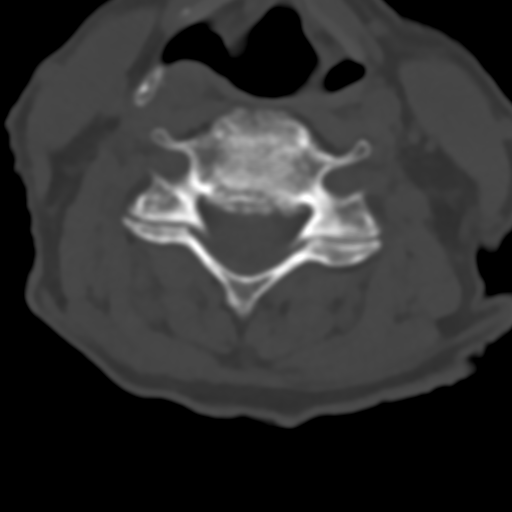
[im 45/82  brain]
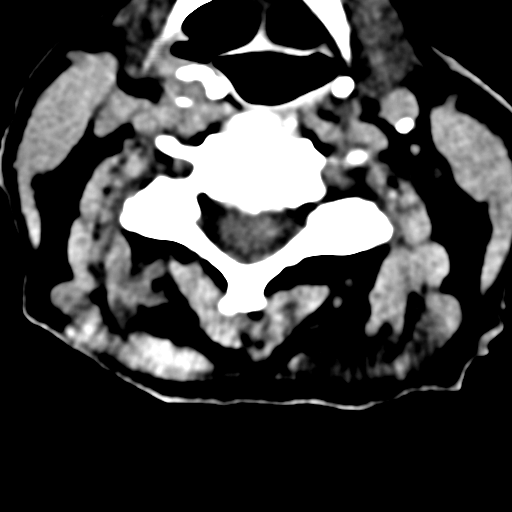
[im 52/82  brain]
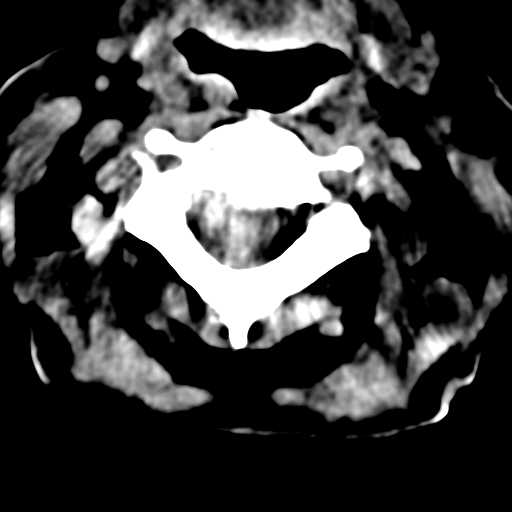
[im 59/82  brain]
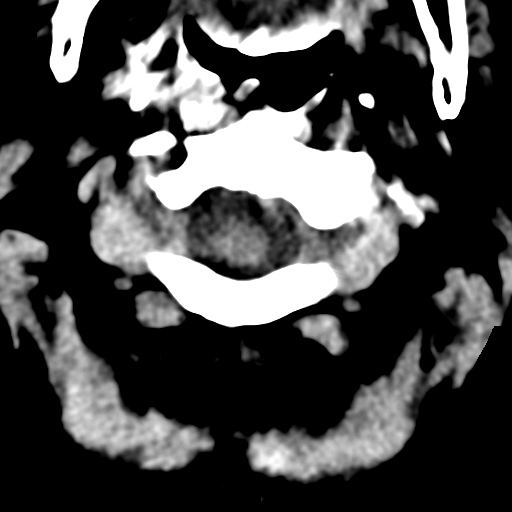
[im 67/82  brain]
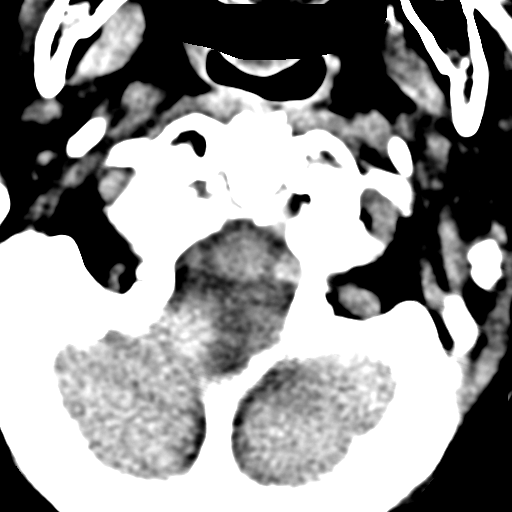
[im 67/82  bone]
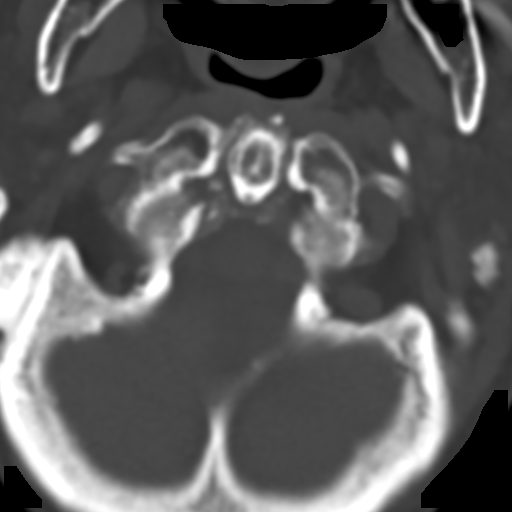
[im 74/82  brain]
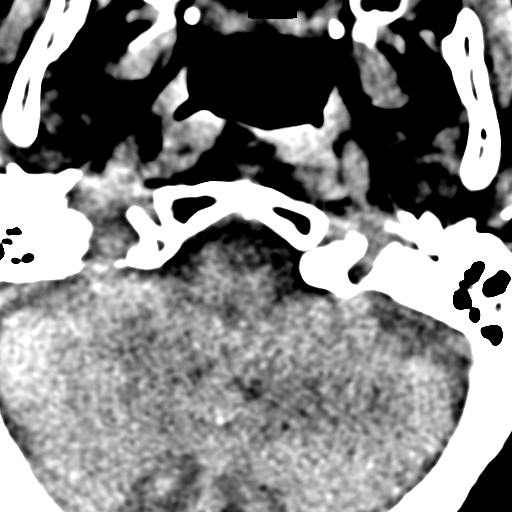

[15 of 47 positions shown; findings below may reference images not displayed]

FINDINGS: CT HEAD FINDINGS

Brain: There is moderate-to-severe age-related atrophy and chronic
microvascular ischemic changes. There is no acute intracranial
hemorrhage. No mass effect or midline shift noted. No extra-axial
fluid collections.

Vascular: No hyperdense vessel or unexpected calcification.

Skull: Normal. Negative for fracture or focal lesion.

Sinuses/Orbits: Mild mucoperiosteal thickening of paranasal sinuses.
No air-fluid levels. The mastoid air cells are clear. Small right
frontal sinus osteoma.

Other: There is scalp hematoma over the vertex.

CT CERVICAL SPINE FINDINGS

Alignment: No acute subluxation.

Skull base and vertebrae: No acute fracture. No primary bone lesion
or focal pathologic process. The bones are osteopenic.

Soft tissues and spinal canal: No prevertebral fluid or swelling. No
visible canal hematoma.

Disc levels: Multilevel degenerative changes with endplate
irregularity and disc space narrowing and osteophyte most prominent
at C4-C5 and C5-C6. There is a grade 1 C5-C6 retrolisthesis.

Upper chest: Calcified right apical pleural plaque

Other: Atherosclerotic calcification of the carotid bulb.
IMPRESSION: 1. No acute intracranial hemorrhage. Moderate to severe age-related
atrophy and chronic microvascular ischemic changes.
2. No acute/traumatic cervical spine pathology. Degenerative
changes.
